# Patient Record
Sex: Female | Born: 1955 | Race: White | Hispanic: No | Marital: Single
Health system: Southern US, Academic
[De-identification: ages and names within clinical notes are randomized; demographics above are authoritative.]

## PROBLEM LIST (undated history)

## (undated) ENCOUNTER — Encounter

## (undated) ENCOUNTER — Ambulatory Visit

## (undated) DIAGNOSIS — I2510 Atherosclerotic heart disease of native coronary artery without angina pectoris: Secondary | ICD-10-CM

## (undated) DIAGNOSIS — E785 Hyperlipidemia, unspecified: Secondary | ICD-10-CM

## (undated) DIAGNOSIS — E119 Type 2 diabetes mellitus without complications: Secondary | ICD-10-CM

## (undated) DIAGNOSIS — N200 Calculus of kidney: Secondary | ICD-10-CM

## (undated) DIAGNOSIS — I10 Essential (primary) hypertension: Secondary | ICD-10-CM

## (undated) DIAGNOSIS — M545 Low back pain: Secondary | ICD-10-CM

## (undated) DIAGNOSIS — Z9889 Other specified postprocedural states: Secondary | ICD-10-CM

## (undated) DIAGNOSIS — E669 Obesity, unspecified: Secondary | ICD-10-CM

## (undated) DIAGNOSIS — R87629 Unspecified abnormal cytological findings in specimens from vagina: Secondary | ICD-10-CM

## (undated) HISTORY — DX: Low back pain: M54.5

## (undated) HISTORY — DX: Essential (primary) hypertension: I10

## (undated) HISTORY — DX: Type 2 diabetes mellitus without complications: E11.9

## (undated) HISTORY — DX: Calculus of kidney: N20.0

## (undated) HISTORY — DX: Hyperlipidemia, unspecified: E78.5

## (undated) HISTORY — DX: Atherosclerotic heart disease of native coronary artery without angina pectoris: I25.10

## (undated) HISTORY — DX: Obesity, unspecified: E66.9

## (undated) HISTORY — PX: CATARACT EXTRACTION: SUR2

## (undated) HISTORY — DX: Unspecified abnormal cytological findings in specimens from vagina: R87.629

## (undated) HISTORY — PX: GALLBLADDER SURGERY: SHX652

## (undated) HISTORY — PX: BREAST SURGERY: SHX581

---

## 2001-04-08 ENCOUNTER — Other Ambulatory Visit: Admission: RE | Admit: 2001-04-08 | Discharge: 2001-04-08 | Payer: Self-pay | Admitting: Obstetrics and Gynecology

## 2005-10-19 ENCOUNTER — Ambulatory Visit (HOSPITAL_BASED_OUTPATIENT_CLINIC_OR_DEPARTMENT_OTHER): Admission: RE | Admit: 2005-10-19 | Discharge: 2005-10-19 | Payer: Self-pay | Admitting: General Surgery

## 2005-10-19 ENCOUNTER — Encounter (INDEPENDENT_AMBULATORY_CARE_PROVIDER_SITE_OTHER): Payer: Self-pay | Admitting: Specialist

## 2005-10-19 ENCOUNTER — Encounter: Admission: RE | Admit: 2005-10-19 | Discharge: 2005-10-19 | Payer: Self-pay | Admitting: General Surgery

## 2008-06-11 ENCOUNTER — Other Ambulatory Visit: Admission: RE | Admit: 2008-06-11 | Discharge: 2008-06-11 | Payer: Self-pay | Admitting: Obstetrics and Gynecology

## 2009-06-14 ENCOUNTER — Other Ambulatory Visit: Admission: RE | Admit: 2009-06-14 | Discharge: 2009-06-14 | Payer: Self-pay | Admitting: Obstetrics and Gynecology

## 2010-07-15 ENCOUNTER — Other Ambulatory Visit: Admission: RE | Admit: 2010-07-15 | Discharge: 2010-07-15 | Payer: Self-pay | Admitting: Obstetrics and Gynecology

## 2010-10-02 ENCOUNTER — Encounter: Payer: Self-pay | Admitting: General Surgery

## 2011-01-27 NOTE — Op Note (Signed)
Leslie Cobb, Leslie Cobb                 ACCOUNT NO.:  [id number]   MEDICAL RECORD NO.:  [id number]          PATIENT TYPE:  AMB   LOCATION:  DSC                          FACILITY:  [facility]   PHYSICIAN:  Rose Phi. Young, M.D.   DATE OF BIRTH:  Jan 02, 1956   DATE OF PROCEDURE:  10/19/2005  DATE OF DISCHARGE:                                 OPERATIVE REPORT   PREOPERATIVE DIAGNOSIS:  Abnormal right breast mammogram.   POSTOPERATIVE DIAGNOSIS:  Abnormal right breast mammogram.   OPERATION:  Right breast biopsy with needle localization and specimen  mammogram.   SURGEON:  Rose Phi. Maple Hudson, M.D.   ANESTHESIA:  MAC.   OPERATIVE PROCEDURE:  Prior to coming into the operating room, a localizing  wire had been placed through the lesion at about the 4:30 position along the  areolar margin.   The patient was placed on the operating table with the arms extended on the  armboard. The right breast was prepped and draped in the usual fashion. A  curved incision using the previously placed wire and the marking for the  site of the lesion was then outlined with a marking pencil and the area  thoroughly infiltrated with the local anesthetic.   The incision was made and the wire delivered into the incision and then the  wire and surrounding tissue were excised. Specimen mammogram looked okay but  they also did an ultrasound which confirmed that the abnormality was  removed.   Hemostasis was obtained with the cautery and subcuticular closure of #4-0  Monocryl and Steri-Strips carried out. Dressing applied. The patient then  transferred to the recovery room in satisfactory condition having tolerated  the procedure well.      Rose Phi. Maple Hudson, M.D.  Electronically Signed     PRY/MEDQ  D:  10/19/2005  T:  10/19/2005  Job:  [id number]

## 2011-07-18 ENCOUNTER — Other Ambulatory Visit: Payer: Self-pay | Admitting: Adult Health

## 2011-07-18 ENCOUNTER — Other Ambulatory Visit (HOSPITAL_COMMUNITY)
Admission: RE | Admit: 2011-07-18 | Discharge: 2011-07-18 | Disposition: A | Discharge status: Home / Self Care | Payer: BC Managed Care – PPO | Source: Ambulatory Visit | Attending: Obstetrics and Gynecology | Admitting: Obstetrics and Gynecology

## 2011-07-18 DIAGNOSIS — Z01419 Encounter for gynecological examination (general) (routine) without abnormal findings: Secondary | ICD-10-CM | POA: Insufficient documentation

## 2012-07-31 ENCOUNTER — Other Ambulatory Visit: Payer: Self-pay | Admitting: Adult Health

## 2012-07-31 ENCOUNTER — Other Ambulatory Visit (HOSPITAL_COMMUNITY)
Admission: RE | Admit: 2012-07-31 | Discharge: 2012-07-31 | Disposition: A | Discharge status: Home / Self Care | Payer: BC Managed Care – PPO | Source: Ambulatory Visit | Attending: Obstetrics and Gynecology | Admitting: Obstetrics and Gynecology

## 2012-07-31 DIAGNOSIS — Z1151 Encounter for screening for human papillomavirus (HPV): Secondary | ICD-10-CM | POA: Insufficient documentation

## 2012-07-31 DIAGNOSIS — Z01419 Encounter for gynecological examination (general) (routine) without abnormal findings: Secondary | ICD-10-CM | POA: Insufficient documentation

## 2013-07-12 DIAGNOSIS — M545 Low back pain, unspecified: Secondary | ICD-10-CM

## 2013-07-12 HISTORY — DX: Low back pain, unspecified: M54.50

## 2013-08-06 ENCOUNTER — Ambulatory Visit (INDEPENDENT_AMBULATORY_CARE_PROVIDER_SITE_OTHER): Payer: BC Managed Care – PPO | Admitting: Adult Health

## 2013-08-06 ENCOUNTER — Encounter (INDEPENDENT_AMBULATORY_CARE_PROVIDER_SITE_OTHER): Payer: Self-pay

## 2013-08-06 ENCOUNTER — Encounter: Payer: Self-pay | Admitting: Adult Health

## 2013-08-06 VITALS — BP 128/80 | HR 78 | Ht 64.5 in | Wt 220.0 lb

## 2013-08-06 DIAGNOSIS — Z1212 Encounter for screening for malignant neoplasm of rectum: Secondary | ICD-10-CM

## 2013-08-06 DIAGNOSIS — E119 Type 2 diabetes mellitus without complications: Secondary | ICD-10-CM

## 2013-08-06 DIAGNOSIS — M545 Low back pain, unspecified: Secondary | ICD-10-CM | POA: Insufficient documentation

## 2013-08-06 DIAGNOSIS — I10 Essential (primary) hypertension: Secondary | ICD-10-CM

## 2013-08-06 DIAGNOSIS — E785 Hyperlipidemia, unspecified: Secondary | ICD-10-CM

## 2013-08-06 DIAGNOSIS — Z01419 Encounter for gynecological examination (general) (routine) without abnormal findings: Secondary | ICD-10-CM

## 2013-08-06 HISTORY — DX: Low back pain, unspecified: M54.50

## 2013-08-06 LAB — HEMOCCULT GUIAC POC 1CARD (OFFICE)

## 2013-08-06 MED ORDER — CYCLOBENZAPRINE HCL 5 MG PO TABS: 5.0000 mg | ORAL_TABLET | Freq: Three times a day (TID) | ORAL | Status: DC | PRN

## 2013-08-06 NOTE — Patient Instructions (Signed)
Back Pain, Adult Low back pain is very common. About 1 in 5 people have back pain.The cause of low back pain is rarely dangerous. The pain often gets better over time.About half of people with a sudden onset of back pain feel better in just 2 weeks. About 8 in 10 people feel better by 6 weeks.  CAUSES Some common causes of back pain include:  Strain of the muscles or ligaments supporting the spine.  Wear and tear (degeneration) of the spinal discs.  Arthritis.  Direct injury to the back. DIAGNOSIS Most of the time, the direct cause of low back pain is not known.However, back pain can be treated effectively even when the exact cause of the pain is unknown.Answering your caregiver's questions about your overall health and symptoms is one of the most accurate ways to make sure the cause of your pain is not dangerous. If your caregiver needs more information, he or she may order lab work or imaging tests (X-rays or MRIs).However, even if imaging tests show changes in your back, this usually does not require surgery. HOME CARE INSTRUCTIONS For many people, back pain returns.Since low back pain is rarely dangerous, it is often a condition that people can learn to manageon their own.   Remain active. It is stressful on the back to sit or stand in one place. Do not sit, drive, or stand in one place for more than 30 minutes at a time. Take short walks on level surfaces as soon as pain allows.Try to increase the length of time you walk each day.  Do not stay in bed.Resting more than 1 or 2 days can delay your recovery.  Do not avoid exercise or work.Your body is made to move.It is not dangerous to be active, even though your back may hurt.Your back will likely heal faster if you return to being active before your pain is gone.  Pay attention to your body when you bend and lift. Many people have less discomfortwhen lifting if they bend their knees, keep the load close to their bodies,and  avoid twisting. Often, the most comfortable positions are those that put less stress on your recovering back.  Find a comfortable position to sleep. Use a firm mattress and lie on your side with your knees slightly bent. If you lie on your back, put a pillow under your knees.  Only take over-the-counter or prescription medicines as directed by your caregiver. Over-the-counter medicines to reduce pain and inflammation are often the most helpful.Your caregiver may prescribe muscle relaxant drugs.These medicines help dull your pain so you can more quickly return to your normal activities and healthy exercise.  Put ice on the injured area.  Put ice in a plastic bag.  Place a towel between your skin and the bag.  Leave the ice on for 15-20 minutes, 03-04 times a day for the first 2 to 3 days. After that, ice and heat may be alternated to reduce pain and spasms.  Ask your caregiver about trying back exercises and gentle massage. This may be of some benefit.  Avoid feeling anxious or stressed.Stress increases muscle tension and can worsen back pain.It is important to recognize when you are anxious or stressed and learn ways to manage it.Exercise is a great option. SEEK MEDICAL CARE IF:  You have pain that is not relieved with rest or medicine.  You have pain that does not improve in 1 week.  You have new symptoms.  You are generally not feeling well. SEEK   IMMEDIATE MEDICAL CARE IF:   You have pain that radiates from your back into your legs.  You develop new bowel or bladder control problems.  You have unusual weakness or numbness in your arms or legs.  You develop nausea or vomiting.  You develop abdominal pain.  You feel faint. Document Released: 08/28/2005 Document Revised: 02/27/2012 Document Reviewed: 01/16/2011 [facility] Patient Information 2014 [place], [place]. Get mammogram Physical in 1 year Labs with PCP Get colonoscopy advised Try ice 10 minutes on then off 20-30  minutes  Take flexeril, if not better get xray

## 2013-08-06 NOTE — Progress Notes (Signed)
Patient ID: Leslie Cobb, female   DOB: 03-Jan-1956, 57 y.o.   MRN: [id number] History of Present Illness: Leslie Cobb is a 57 year old white female in for a physical.She had a normal pap with HPV 07/31/12.   Current Medications, Allergies, Past Medical History, Past Surgical History, Family History and Social History were reviewed in [employer] record.   Past Medical History  Diagnosis Date  . Hyperlipidemia   . Hypertension   . Diabetes mellitus without complication   . Obesity   . Low back pain 08/06/2013   Past Surgical History  Procedure Laterality Date  . Breast surgery Left   . Gallbladder surgery    Current outpatient prescriptions:aspirin (ST JOSEPH ASPIRIN) 81 MG EC tablet, Take 81 mg by mouth daily. Swallow whole., Disp: , Rfl: ;  atenolol (TENORMIN) 50 MG tablet, Take 50 mg by mouth daily. Takes one and a half daily, Disp: , Rfl: ;  Cyanocobalamin (VITAMIN B 12 PO), Take by mouth. Twice weekly, Disp: , Rfl: ;  Fluticasone Propionate (FLONASE NA), Place into the nose as needed., Disp: , Rfl:  glipiZIDE (GLUCOTROL) 10 MG tablet, Take 10 mg by mouth daily., Disp: , Rfl: ;  hydrochlorothiazide (HYDRODIURIL) 25 MG tablet, Take 25 mg by mouth daily., Disp: , Rfl: ;  losartan (COZAAR) 25 MG tablet, Take 25 mg by mouth daily., Disp: , Rfl: ;  lovastatin (MEVACOR) 20 MG tablet, Take 20 mg by mouth daily., Disp: , Rfl: ;  Multiple Vitamin (MULTIVITAMIN) tablet, Take 1 tablet by mouth daily., Disp: , Rfl:  potassium chloride (K-DUR) 10 MEQ tablet, Take 10 mEq by mouth daily., Disp: , Rfl: ;  cyclobenzaprine (FLEXERIL) 5 MG tablet, Take 1 tablet (5 mg total) by mouth 3 (three) times daily as needed for muscle spasms., Disp: 30 tablet, Rfl: 1  Review of Systems: Patient denies any headaches, blurred vision, shortness of breath, chest pain, abdominal pain, problems with bowel movements, urination, or intercourse. Not having sex.No joint pain but has low back pain that  sometimes radiates down left leg, no mood swings.   Physical Exam:BP 128/80  Pulse 78  Ht 5' 4.5" (1.638 m)  Wt 220 lb (99.791 kg)  BMI 37.19 kg/m2Urine negative General:  Well developed, well nourished, no acute distress Skin:  Warm and dry Neck:  Midline trachea, normal thyroid Lungs; Clear to auscultation bilaterally Breast:  No dominant palpable mass, retraction, or nipple discharge Cardiovascular: Regular rate and rhythm Abdomen:  Soft, non tender, no hepatosplenomegaly, No CVAT,some tenderness low back Pelvic:  External genitalia is normal in appearance.  The vagina is normal in appearance. The cervix is bulbous.  Uterus is felt to be normal size, shape, and contour.  No adnexal masses or tenderness noted. Rectal: Good sphincter tone, no polyps, or hemorrhoids felt.  Hemoccult negative. Extremities:  No swelling or varicosities noted Psych:  No mood changes, alert and cooperative  Impression: Yearly gyn exam no pap Low back pain Diabetes Hypertension Elevated cholesterol   Plan: Physical in 1 year Mammogram now and yearly Colonoscopy advised Labs with PCP Rx flexeril 5 mg #30 1 every 8 hours with 1 refill Use ice 10 minutes on then off 20 minutes Review handout on back pain if not better call and will get xray

## 2014-07-13 ENCOUNTER — Encounter: Payer: Self-pay | Admitting: Adult Health

## 2014-08-10 ENCOUNTER — Encounter: Payer: Self-pay | Admitting: Adult Health

## 2014-08-10 ENCOUNTER — Ambulatory Visit (INDEPENDENT_AMBULATORY_CARE_PROVIDER_SITE_OTHER): Payer: BC Managed Care – PPO | Admitting: Adult Health

## 2014-08-10 VITALS — BP 150/78 | HR 78 | Ht 64.0 in | Wt 219.0 lb

## 2014-08-10 DIAGNOSIS — Z1212 Encounter for screening for malignant neoplasm of rectum: Secondary | ICD-10-CM

## 2014-08-10 DIAGNOSIS — Z01419 Encounter for gynecological examination (general) (routine) without abnormal findings: Secondary | ICD-10-CM

## 2014-08-10 LAB — HEMOCCULT GUIAC POC 1CARD (OFFICE): Fecal Occult Blood, POC: NEGATIVE

## 2014-08-10 NOTE — Progress Notes (Signed)
Patient ID: Leslie Cobb, female   DOB: 10-27-1955, 58 y.o.   MRN: [id number] History of Present Illness:  Leslie Cobb is a 58 year old white female,divorced in for gyn exam.She had a normal pap with negative HPV 07/31/12.Got flu shot this year.  Current Medications, Allergies, Past Medical History, Past Surgical History, Family History and Social History were reviewed in [facility] electronic medical record.     Review of Systems: Patient denies any headaches, blurred vision, shortness of breath, chest pain, abdominal pain, problems with bowel movements, urination, or intercourse. Not having sex,no mood swings, has bursitis in left hip.    Physical Exam:BP 150/78 mmHg  Pulse 78  Ht 5\' 4"  (1.626 m)  Wt 219 lb (99.338 kg)  BMI 37.57 kg/m2 General:  Well developed, well nourished, no acute distress Skin:  Warm and dry Neck:  Midline trachea, normal thyroid Lungs; Clear to auscultation bilaterally Breast:  No dominant palpable mass, retraction, or nipple discharge Cardiovascular: Regular rate and rhythm Abdomen:  Soft, non tender, no hepatosplenomegaly Pelvic:  External genitalia is normal in appearance.  The vagina is normal in appearance. The cervix is smooth.  Uterus is felt to be normal size, shape, and contour.  No adnexal masses or tenderness noted. Rectal: Good sphincter tone, no polyps, or hemorrhoids felt.  Hemoccult negative. Extremities:  No swelling or varicosities noted Psych:  No mood changes,alert and cooperative,seems happy   Impression: Well woman gyn exam no pap    Plan: Pap and physical in 1 year Mammogram yearly  Labs with PCP Colonoscopy 2025, had this year

## 2014-08-10 NOTE — Patient Instructions (Signed)
Pap and physical in 1 year Mammogram yearly Labs with PCP Colonoscopy per GI 2025

## 2015-08-16 ENCOUNTER — Encounter: Payer: Self-pay | Admitting: Adult Health

## 2015-08-16 ENCOUNTER — Ambulatory Visit (INDEPENDENT_AMBULATORY_CARE_PROVIDER_SITE_OTHER): Payer: BC Managed Care – PPO | Admitting: Adult Health

## 2015-08-16 ENCOUNTER — Other Ambulatory Visit (HOSPITAL_COMMUNITY)
Admission: RE | Admit: 2015-08-16 | Discharge: 2015-08-16 | Disposition: A | Discharge status: Home / Self Care | Payer: BC Managed Care – PPO | Source: Ambulatory Visit | Attending: Adult Health | Admitting: Adult Health

## 2015-08-16 VITALS — BP 132/68 | HR 64 | Ht 63.5 in | Wt 208.0 lb

## 2015-08-16 DIAGNOSIS — Z1151 Encounter for screening for human papillomavirus (HPV): Secondary | ICD-10-CM | POA: Diagnosis not present

## 2015-08-16 DIAGNOSIS — Z01411 Encounter for gynecological examination (general) (routine) with abnormal findings: Secondary | ICD-10-CM | POA: Insufficient documentation

## 2015-08-16 DIAGNOSIS — Z1212 Encounter for screening for malignant neoplasm of rectum: Secondary | ICD-10-CM | POA: Diagnosis not present

## 2015-08-16 DIAGNOSIS — Z01419 Encounter for gynecological examination (general) (routine) without abnormal findings: Secondary | ICD-10-CM

## 2015-08-16 LAB — HEMOCCULT GUIAC POC 1CARD (OFFICE): Fecal Occult Blood, POC: NEGATIVE

## 2015-08-16 NOTE — Patient Instructions (Signed)
Physical in  1 year,pap in 3 if normal Mammogram yearly Labs with PCP  Colonoscopy per GI 

## 2015-08-16 NOTE — Progress Notes (Signed)
Patient ID: Leslie Cobb, female   DOB: 02-Sep-1956, 59 y.o.   MRN: [id number] History of Present Illness: [name] is a 59 year old white female,G1P1 in for a well woman gyn exam and pap.She was seen by PCP last week for GI bug,but is better,she has had labs with PCP and had mamm[place] in [place], at [facility], and had colonoscopy 2015 in [place] and got flu shot at work, she works for the [employer]. PCP is [facility] in [place].  Current Medications, Allergies, Past Medical History, Past Surgical History, Family History and Social History were reviewed in [facility] electronic medical record.     Review of Systems: Patient denies any headaches, hearing loss, fatigue, blurred vision, shortness of breath, chest pain, abdominal pain, problems with bowel movements, urination, or intercourse(not having sex). No joint pain or mood swings.    Physical Exam:BP 132/68 mmHg  Pulse 64  Ht 5' 3.5" (1.613 m)  Wt 208 lb (94.348 kg)  BMI 36.26 kg/m2 General:  Well developed, well nourished, no acute distress Skin:  Warm and dry Neck:  Midline trachea, normal thyroid, good ROM, no lymphadenopathy Lungs; Clear to auscultation bilaterally Breast:  No dominant palpable mass, retraction, or nipple discharge Cardiovascular: Regular rate and rhythm Abdomen:  Soft, non tender, no hepatosplenomegaly Pelvic:  External genitalia is normal in appearance, no lesions.  The vagina is normal in appearance. Urethra has no lesions or masses. The cervix is smooth,pap with HPV performed.  Uterus is felt to be normal size, shape, and contour.  No adnexal masses or tenderness noted.Bladder is non tender, no masses felt. Rectal: Good sphincter tone, no polyps, or hemorrhoids felt.  Hemoccult negative. Extremities/musculoskeletal:  No swelling or varicosities noted, no clubbing or cyanosis Psych:  No mood changes, alert and cooperative,seems happy   Impression: Well woman gyn exam and  pap    Plan: Physical in 1 year,pap in 3 if normal with negative HPV Mammogram yearly Colonoscopy per GI Labs with PCP

## 2015-08-17 LAB — CYTOLOGY - PAP

## 2015-08-18 ENCOUNTER — Telehealth: Payer: Self-pay | Admitting: Adult Health

## 2015-08-18 NOTE — Telephone Encounter (Signed)
Pt aware PAP ASCUS +HPV and needs colpo. To make appt

## 2015-08-18 NOTE — Telephone Encounter (Signed)
Left message to call about pap 

## 2015-08-19 ENCOUNTER — Telehealth: Payer: Self-pay | Admitting: Adult Health

## 2015-08-19 NOTE — Telephone Encounter (Signed)
Has colpo 12/19 just had question about pap, tried to answer

## 2015-08-19 NOTE — Telephone Encounter (Signed)
Left message I called 

## 2015-08-19 NOTE — Telephone Encounter (Signed)
Pt called stating that she is returning Jennifer's phone call. Pt states that she can't pick up her phone until 4:30 today. If she could geta phone call then or she'll call back @ this time.

## 2015-08-30 ENCOUNTER — Encounter: Payer: Self-pay | Admitting: Obstetrics and Gynecology

## 2015-08-30 ENCOUNTER — Ambulatory Visit (INDEPENDENT_AMBULATORY_CARE_PROVIDER_SITE_OTHER): Payer: BC Managed Care – PPO | Admitting: Obstetrics and Gynecology

## 2015-08-30 ENCOUNTER — Other Ambulatory Visit: Payer: Self-pay | Admitting: Obstetrics and Gynecology

## 2015-08-30 VITALS — BP 140/90 | Ht 64.0 in | Wt 212.0 lb

## 2015-08-30 DIAGNOSIS — R87610 Atypical squamous cells of undetermined significance on cytologic smear of cervix (ASC-US): Secondary | ICD-10-CM | POA: Diagnosis not present

## 2015-08-30 DIAGNOSIS — R87810 Cervical high risk human papillomavirus (HPV) DNA test positive: Secondary | ICD-10-CM | POA: Diagnosis not present

## 2015-08-30 MED ORDER — ESTROGENS, CONJUGATED 0.625 MG/GM VA CREA: 1.0000 g | TOPICAL_CREAM | VAGINAL | Status: DC

## 2015-08-30 NOTE — Progress Notes (Signed)
Patient ID: [name], female   DOB: Jun 13, 1956, 59 y.o.   MRN: [id number] Pt here today for colposcopy. Pt aware of procedure and questions answered.

## 2015-08-30 NOTE — Progress Notes (Signed)
Patient ID: Leslie Cobb, female   DOB: 11-28-1955, 59 y.o.   MRN: [id number]  Leslie Cobb 59 y.o. G1P1 here for colposcopy for ASCUS with POSITIVE high risk HPV pap smear on 08/16/15.  Discussed role for HPV in cervical dysplasia, need for surveillance. Pt has a h/o STD found on pap smear, which was her first abnormal pap. She reports no new partners. Pt states she no longer has periods.  Patient given informed consent, signed copy in the chart, time out was performed.  Placed in lithotomy position. Cervix viewed with speculum and colposcope after application of acetic acid.   Colposcopy adequate? Yes  no visible lesions; no biopsies.   ECC specimen obtained. All specimens were labelled and sent to pathology.   Colposcopy IMPRESSION: Vagina and cervix appear atrophic and post-menopausal. No visible dysplasia and ECC done.   Patient was given post procedure instructions. Will follow up pathology and manage accordingly.  Routine preventative health maintenance measures emphasized. Follow up for repeat pap smear in 3-6 months. Will treat with Premarin vc 3x/wk x one month prior to next pap.    By signing my name below, I, Doreatha MartinEva Mathews, attest that this documentation has been prepared under the direction and in the presence of Tilda BurrowJohn Iza Preston V, MD. Electronically Signed: Doreatha MartinEva Mathews, ED Scribe. 08/30/2015. 11:53 AM.  I personally performed the services described in this documentation, which was SCRIBED in my presence. The recorded information has been reviewed and considered accurate. It has been edited as necessary during review. Tilda BurrowFERGUSON,Khiara Shuping V, MD

## 2015-08-30 NOTE — Addendum Note (Signed)
Addended by: Richardson ChiquitoRAVIS, ASHLEY M on: 08/30/2015 12:24 PM   Modules accepted: Orders

## 2015-09-20 ENCOUNTER — Telehealth: Payer: Self-pay | Admitting: Obstetrics and Gynecology

## 2015-09-20 NOTE — Telephone Encounter (Signed)
Pt called stating that she would like a call back from the nurse regarding the results of her blood work. Please contact pt

## 2015-09-20 NOTE — Telephone Encounter (Signed)
Pt aware of results and aware to keep her appointment in March for her repeat pap.

## 2015-11-29 ENCOUNTER — Ambulatory Visit: Payer: BC Managed Care – PPO | Admitting: Obstetrics and Gynecology

## 2015-12-03 ENCOUNTER — Encounter: Payer: Self-pay | Admitting: Obstetrics and Gynecology

## 2015-12-03 ENCOUNTER — Other Ambulatory Visit (HOSPITAL_COMMUNITY)
Admission: RE | Admit: 2015-12-03 | Discharge: 2015-12-03 | Disposition: A | Discharge status: Home / Self Care | Payer: BC Managed Care – PPO | Source: Ambulatory Visit | Attending: Obstetrics and Gynecology | Admitting: Obstetrics and Gynecology

## 2015-12-03 ENCOUNTER — Ambulatory Visit (INDEPENDENT_AMBULATORY_CARE_PROVIDER_SITE_OTHER): Payer: BC Managed Care – PPO | Admitting: Obstetrics and Gynecology

## 2015-12-03 VITALS — BP 154/88 | HR 61 | Ht 64.0 in | Wt 218.0 lb

## 2015-12-03 DIAGNOSIS — Z124 Encounter for screening for malignant neoplasm of cervix: Secondary | ICD-10-CM

## 2015-12-03 DIAGNOSIS — Z1151 Encounter for screening for human papillomavirus (HPV): Secondary | ICD-10-CM | POA: Insufficient documentation

## 2015-12-03 DIAGNOSIS — R87610 Atypical squamous cells of undetermined significance on cytologic smear of cervix (ASC-US): Secondary | ICD-10-CM | POA: Insufficient documentation

## 2015-12-03 DIAGNOSIS — Z01411 Encounter for gynecological examination (general) (routine) with abnormal findings: Secondary | ICD-10-CM | POA: Diagnosis present

## 2015-12-03 DIAGNOSIS — R87810 Cervical high risk human papillomavirus (HPV) DNA test positive: Secondary | ICD-10-CM | POA: Diagnosis not present

## 2015-12-03 NOTE — Progress Notes (Signed)
Assessment:  Annual Gyn Exam   Plan:  1. pap smear done, next pap due 1 year 2. return annually or prn 3    Annual mammogram advised Subjective:  Leslie Cobb is a 60 y.o. female G1P1 who presents for annual exam. No LMP recorded. Patient is postmenopausal. The patient has no complaints today. Pt is here today for a repeat pap smear, due to having an abnormal pap smear in December 2016 indicative of ASCUS with Positive HPV. Pt had a colpo completed following the pap smear and was started on premarin and informed to follow up in 3-6 months for a pap smear.  Pt last dose of premarin was yesterday. Pt denies any other symptoms.   The following portions of the patient's history were reviewed and updated as appropriate: allergies, current medications, past family history, past medical history, past social history, past surgical history and problem list. Past Medical History  Diagnosis Date  . Hyperlipidemia   . Hypertension   . Diabetes mellitus without complication (HCC)   . Obesity   . Low back pain 08/06/2013  . Vaginal Pap smear, abnormal     Past Surgical History  Procedure Laterality Date  . Breast surgery Left   . Gallbladder surgery       Current outpatient prescriptions:  .  aspirin (ST JOSEPH ASPIRIN) 81 MG EC tablet, Take 81 mg by mouth daily. Swallow whole., Disp: , Rfl:  .  atenolol (TENORMIN) 50 MG tablet, Take 50 mg by mouth daily. Takes one and a half daily, Disp: , Rfl:  .  Cholecalciferol (VITAMIN D-3 PO), Take by mouth daily., Disp: , Rfl:  .  conjugated estrogens (PREMARIN) vaginal cream, Place 0.5 Applicatorfuls vaginally 3 (three) times a week. Use the month before your next pap., Disp: 42.5 g, Rfl: 1 .  Cyanocobalamin (VITAMIN B 12 PO), Take by mouth. Twice weekly, Disp: , Rfl:  .  glipiZIDE (GLUCOTROL XL) 10 MG 24 hr tablet, 10 mg daily. , Disp: , Rfl:  .  hydrochlorothiazide (HYDRODIURIL) 25 MG tablet, Take 25 mg by mouth daily., Disp: , Rfl:  .   losartan (COZAAR) 50 MG tablet, 50 mg daily. , Disp: , Rfl:  .  lovastatin (MEVACOR) 20 MG tablet, Take 20 mg by mouth daily., Disp: , Rfl:  .  Multiple Vitamin (MULTIVITAMIN) tablet, Take 1 tablet by mouth daily., Disp: , Rfl:  .  potassium chloride (K-DUR) 10 MEQ tablet, Take 10 mEq by mouth daily., Disp: , Rfl:  .  triamcinolone cream (KENALOG) 0.1 %, 1 application as needed. , Disp: , Rfl:   Review of Systems Constitutional: negative Gastrointestinal: negative Genitourinary: negative  Objective:  BP 154/88 mmHg  Pulse 61  Ht  (1.626 m)  Wt 218 lb (98.884 kg)  BMI 37.40 kg/m2   BMI: Body mass index is 37.4 kg/(m^2).  General Appearance: Alert, appropriate appearance for age. No acute distress HEENT: Grossly normal Neck / Thyroid:  Cardiovascular: RRR; normal S1, S2, no murmur Lungs: CTA bilaterally Back: No CVAT Breast Exam: Not examed Gastrointestinal: Soft, non-tender, no masses or organomegaly Pelvic Exam: Vulva and vagina appear normal. Bimanual exam reveals normal uterus and adnexa. External genitalia: normal general appearance Vaginal: normal mucosa without prolapse or lesions, normal without tenderness, induration or masses and normal rugae Cervix: normal appearance Adnexa: not completed Uterus: normal single, nontender and well supported Rectovaginal: not indicated Lymphatic Exam: Non-palpable nodes in neck, clavicular, axillary, or inguinal regions  Skin: no rash or abnormalities Neurologic:  Normal gait and speech, no tremor  Psychiatric: Alert and oriented, appropriate affect.  Urinalysis:Not done  Christin BachJohn Burton Gahan. MD Pgr [phone] 10:19 AM  A: Repeat pap smear due to ASCUS and +HPV  P: Follow up in 1 year   By signing my name below, I, Soijett Blue, attest that this documentation has been prepared under the direction and in the presence of Tilda BurrowJohn Tarique Loveall V, MD. Electronically Signed: Soijett Blue, ED Scribe. 12/03/2015. 10:28 AM.  I personally  performed the services described in this documentation, which was SCRIBED in my presence. The recorded information has been reviewed and considered accurate. It has been edited as necessary during review. Tilda BurrowFERGUSON,Margarete Horace V, MD

## 2015-12-08 LAB — CYTOLOGY - PAP

## 2015-12-15 ENCOUNTER — Telehealth: Payer: Self-pay | Admitting: Obstetrics and Gynecology

## 2015-12-15 NOTE — Telephone Encounter (Signed)
Pt aware of pap results and aware that she will need another colposcopy. Pt verbalized understanding and the phone call was sent up front for an appointment.

## 2015-12-20 ENCOUNTER — Ambulatory Visit (INDEPENDENT_AMBULATORY_CARE_PROVIDER_SITE_OTHER): Payer: BC Managed Care – PPO | Admitting: Obstetrics and Gynecology

## 2015-12-20 ENCOUNTER — Encounter: Payer: Self-pay | Admitting: Obstetrics and Gynecology

## 2015-12-20 ENCOUNTER — Other Ambulatory Visit: Payer: Self-pay | Admitting: Obstetrics and Gynecology

## 2015-12-20 VITALS — BP 170/100 | Ht 64.0 in

## 2015-12-20 DIAGNOSIS — IMO0002 Reserved for concepts with insufficient information to code with codable children: Secondary | ICD-10-CM

## 2015-12-20 DIAGNOSIS — R896 Abnormal cytological findings in specimens from other organs, systems and tissues: Secondary | ICD-10-CM

## 2015-12-20 NOTE — Progress Notes (Signed)
Patient ID: Leslie Cobb, female   DOB: 23-Feb-1956, 60 y.o.   MRN: [id number]  Leslie Cobb 60 y.o. G1P1 here for colposcopy for ASCUS with positive HPV pap smear on 12/03/15. Discussed role for HPV in cervical dysplasia, need for surveillance.  Patient given informed consent, signed copy in the chart, time out was performed.  Placed in lithotomy position. Cervix viewed with speculum and colposcope after application of acetic acid.   Colposcopy adequate? Yes cervix is atrophic postmenopausal with fibrosis; unable to visualize into cervical canal.  no visible lesions; biopsies obtained at 6, 2, 10 o'clock position.  Significant bleeding from 2 oclock bx site finally responded to Monsels. ECC specimen obtained. All specimens were labelled and sent to pathology.  Colposcopy IMPRESSION:  Patient was given post procedure instructions. Will follow up pathology and manage accordingly.  Routine preventative health maintenance measures emphasized.  Pt is advised to return one year for PAP for co-testing or as required per biopsy results.   By signing my name below, I, Marica Otterusrat Rahman, attest that this documentation has been prepared under the direction and in the presence of Christin BachJohn Emon Miggins, MD. Electronically Signed: Marica OtterNusrat Rahman, ED Scribe. 12/20/2015. 12:21 PM   I personally performed the services described in this documentation, which was SCRIBED in my presence. The recorded information has been reviewed and considered accurate. It has been edited as necessary during review. Tilda BurrowFERGUSON,Samreet [place] V, MD

## 2015-12-20 NOTE — Progress Notes (Signed)
Patient ID: Leslie Cobb, female   DOB: [birth year], 60 y.o.   MRN: [id number] Pt here today for colposcopy. Pt worried about

## 2016-12-04 ENCOUNTER — Other Ambulatory Visit: Payer: BC Managed Care – PPO | Admitting: Obstetrics and Gynecology

## 2016-12-07 ENCOUNTER — Encounter: Payer: Self-pay | Admitting: Obstetrics and Gynecology

## 2016-12-07 ENCOUNTER — Other Ambulatory Visit (HOSPITAL_COMMUNITY)
Admission: RE | Admit: 2016-12-07 | Discharge: 2016-12-07 | Disposition: A | Discharge status: Home / Self Care | Payer: BC Managed Care – PPO | Source: Ambulatory Visit | Attending: Obstetrics and Gynecology | Admitting: Obstetrics and Gynecology

## 2016-12-07 ENCOUNTER — Telehealth: Payer: Self-pay | Admitting: Obstetrics and Gynecology

## 2016-12-07 ENCOUNTER — Ambulatory Visit (INDEPENDENT_AMBULATORY_CARE_PROVIDER_SITE_OTHER): Payer: BC Managed Care – PPO | Admitting: Obstetrics and Gynecology

## 2016-12-07 VITALS — BP 120/60 | HR 68 | Ht 63.75 in | Wt 211.5 lb

## 2016-12-07 DIAGNOSIS — Z01411 Encounter for gynecological examination (general) (routine) with abnormal findings: Secondary | ICD-10-CM | POA: Diagnosis present

## 2016-12-07 DIAGNOSIS — B373 Candidiasis of vulva and vagina: Secondary | ICD-10-CM

## 2016-12-07 DIAGNOSIS — Z1151 Encounter for screening for human papillomavirus (HPV): Secondary | ICD-10-CM | POA: Insufficient documentation

## 2016-12-07 DIAGNOSIS — Z01419 Encounter for gynecological examination (general) (routine) without abnormal findings: Secondary | ICD-10-CM

## 2016-12-07 DIAGNOSIS — R350 Frequency of micturition: Secondary | ICD-10-CM | POA: Diagnosis not present

## 2016-12-07 DIAGNOSIS — Z1212 Encounter for screening for malignant neoplasm of rectum: Secondary | ICD-10-CM | POA: Diagnosis not present

## 2016-12-07 DIAGNOSIS — R87610 Atypical squamous cells of undetermined significance on cytologic smear of cervix (ASC-US): Secondary | ICD-10-CM

## 2016-12-07 DIAGNOSIS — B3731 Acute candidiasis of vulva and vagina: Secondary | ICD-10-CM

## 2016-12-07 LAB — HEMOCCULT GUIAC POC 1CARD (OFFICE): FECAL OCCULT BLD: NEGATIVE

## 2016-12-07 LAB — POCT URINALYSIS DIPSTICK
KETONES UA: NEGATIVE
Leukocytes, UA: NEGATIVE
Nitrite, UA: NEGATIVE
PROTEIN UA: NEGATIVE

## 2016-12-07 MED ORDER — MICONAZOLE NITRATE 2 % VA CREA: 1.0000 | TOPICAL_CREAM | VAGINAL | 99 refills | Status: DC

## 2016-12-07 MED ORDER — FLUCONAZOLE 150 MG PO TABS: 150.0000 mg | ORAL_TABLET | ORAL | 3 refills | Status: DC

## 2016-12-07 NOTE — Telephone Encounter (Signed)
Left message letting pt know Monistat and Diflucan was [facility] [place]. JSY

## 2016-12-07 NOTE — Addendum Note (Signed)
Addended by: Tilda BurrowFERGUSON, Raine Blodgett V on: 12/07/2016 05:06 PM   Modules accepted: Orders

## 2016-12-07 NOTE — Progress Notes (Addendum)
Assessment:  1. Annual Gyn Exam 2. Post menopausal atrophic vaginitis  3. 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 History of ASCUS pap, repeated today Plan:  1. pap smear done, next pap due 1 year (due to ASCUS on 2017 pap smear) 2. return annually or prn 3    Annual mammogram advised 4.   Use Monistat twice weekly  5 Diflucan 150 mg q wk Subjective:  Leslie Cobb is a 61 y.o. female G1P1001 who presents for annual exam. No LMP recorded. Patient is postmenopausal. The patient has vulvar irritation. She notes having trouble controlling her diabetes. Fasting morning CBG today was 230s. States she was recently changed to [place]. She is physically inactive.   The following portions of the patient's history were reviewed and updated as appropriate: allergies, current medications, past family history, past medical history, past social history, past surgical history and problem list. Past Medical History:  Diagnosis Date   Diabetes mellitus without complication (HCC)    Hyperlipidemia    Hypertension    Low back pain 08/06/2013   Obesity    Vaginal Pap smear, abnormal     Past Surgical History:  Procedure Laterality Date   BREAST SURGERY Left    GALLBLADDER SURGERY       Current Outpatient Prescriptions:    aspirin (ST JOSEPH ASPIRIN) 81 MG EC tablet, Take 81 mg by mouth daily. Swallow whole., Disp: , Rfl:    atenolol (TENORMIN) 50 MG tablet, Take 50 mg by mouth daily. Takes one and a half daily, Disp: , Rfl:    Cholecalciferol (VITAMIN D-3 PO), Take by mouth daily., Disp: , Rfl:    conjugated estrogens (PREMARIN) vaginal cream, Place 0.5 Applicatorfuls vaginally 3 (three) times a week. Use the month before your next pap. (Patient taking differently: Place 1 g vaginally as needed. Use the month before your next pap.), Disp: 42.5 g, Rfl: 1   Cyanocobalamin (VITAMIN B 12 PO), Take by mouth. Twice weekly, Disp: , Rfl:    hydrochlorothiazide (HYDRODIURIL)  25 MG tablet, Take 25 mg by mouth daily., Disp: , Rfl:    JARDIANCE 10 MG TABS tablet, 10 mg daily. , Disp: , Rfl:    losartan (COZAAR) 50 MG tablet, 50 mg daily. , Disp: , Rfl:    lovastatin (MEVACOR) 20 MG tablet, Take 20 mg by mouth daily., Disp: , Rfl:    Multiple Vitamin (MULTIVITAMIN) tablet, Take 1 tablet by mouth daily., Disp: , Rfl:    Omega-3 Fatty Acids (OMEGA 3 PO), Take by mouth daily., Disp: , Rfl:    potassium chloride (K-DUR) 10 MEQ tablet, Take 10 mEq by mouth daily., Disp: , Rfl:    triamcinolone cream (KENALOG) 0.1 %, 1 application as needed. , Disp: , Rfl:   Review of Systems Constitutional: negative Gastrointestinal: negative Genitourinary: negative   Objective:  BP 120/60 (BP Location: Left Arm, Patient Position: Sitting, Cuff Size: Large)    Pulse 68    Ht 5' 3.75" (1.619 m)    Wt 211 lb 8 oz (95.9 kg)    BMI 36.59 kg/m    BMI: Body mass index is 36.59 kg/m.  General Appearance: Alert, appropriate appearance for age. No acute distress HEENT: Grossly normal Neck / Thyroid:  Cardiovascular: RRR; normal S1, S2, no murmur Lungs: CTA bilaterally Back: No CVAT Breast Exam: No dimpling, nipple retraction or discharge. No masses or nodes., Normal to inspection, Normal breast tissue bilaterally and No masses or nodes.No dimpling, nipple retraction or discharge. Gastrointestinal:  Soft, non-tender, no masses or organomegaly Pelvic Exam: External genitalia: slight erythema  Vaginal: atrophic vaginal tissues, good support, slight erythema.  Cervix: normal appearance Adnexa: normal bimanual exam Uterus: normal single, nontender Rectovaginal: normal rectal, no masses and guaiac negative stool obtained Lymphatic Exam: Non-palpable nodes in neck, clavicular, axillary, or inguinal regions  Skin: no rash or abnormalities Neurologic: Normal gait and speech, no tremor  Psychiatric: Alert and oriented, appropriate affect.  Urinalysis: 4+ glucose and trace blood  Leslie Cobb. MD Pgr [phone] 10:44 AM  By signing my name below, I, Leslie Cobb, attest that this documentation has been prepared under the direction and in the presence of Tilda Burrow, MD. Electronically Signed: Sonum Cobb, Scribe. 12/07/16. 10:44 AM.  I personally performed the services described in this documentation, which was SCRIBED in my presence. The recorded information has been reviewed and considered accurate. It has been edited as necessary during review. Tilda Burrow, MD

## 2016-12-07 NOTE — Progress Notes (Signed)
°Assessment:  °1. Annual Gyn Exam °2. Post menopausal atrophic vaginitis  °3. 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 History of ASCUS pap, repeated today °Plan:  °1. pap smear done, next pap due 1 year (due to ASCUS on 2017 pap smear) °2. return annually or prn °3    Annual mammogram advised °4.   Use Monistat twice weekly  °5 Diflucan 150 mg q wk °Subjective:  °Leslie Cobb is a 61 y.o. female G1P1001 who presents for annual exam. No LMP recorded. Patient is postmenopausal. °The patient has vulvar irritation. She notes having trouble controlling her diabetes. Fasting morning CBG today was 230s. States she was recently changed to Jardiance. She is physically inactive.  ° °The following portions of the patient's history were reviewed and updated as appropriate: allergies, current medications, past family history, past medical history, past social history, past surgical history and problem list. °Past Medical History:  °Diagnosis Date  °• Diabetes mellitus without complication (HCC)   °• Hyperlipidemia   °• Hypertension   °• Low back pain 08/06/2013  °• Obesity   °• Vaginal Pap smear, abnormal   ° ° °Past Surgical History:  °Procedure Laterality Date  °• BREAST SURGERY Left   °• GALLBLADDER SURGERY    ° ° ° °Current Outpatient Prescriptions:  °•  aspirin (ST JOSEPH ASPIRIN) 81 MG EC tablet, Take 81 mg by mouth daily. Swallow whole., Disp: , Rfl:  °•  atenolol (TENORMIN) 50 MG tablet, Take 50 mg by mouth daily. Takes one and a half daily, Disp: , Rfl:  °•  Cholecalciferol (VITAMIN D-3 PO), Take by mouth daily., Disp: , Rfl:  °•  conjugated estrogens (PREMARIN) vaginal cream, Place 0.5 Applicatorfuls vaginally 3 (three) times a week. Use the month before your next pap. (Patient taking differently: Place 1 g vaginally as needed. Use the month before your next pap.), Disp: 42.5 g, Rfl: 1 °•  Cyanocobalamin (VITAMIN B 12 PO), Take by mouth. Twice weekly, Disp: , Rfl:  °•  hydrochlorothiazide (HYDRODIURIL)  25 MG tablet, Take 25 mg by mouth daily., Disp: , Rfl:  °•  JARDIANCE 10 MG TABS tablet, 10 mg daily. , Disp: , Rfl:  °•  losartan (COZAAR) 50 MG tablet, 50 mg daily. , Disp: , Rfl:  °•  lovastatin (MEVACOR) 20 MG tablet, Take 20 mg by mouth daily., Disp: , Rfl:  °•  Multiple Vitamin (MULTIVITAMIN) tablet, Take 1 tablet by mouth daily., Disp: , Rfl:  °•  Omega-3 Fatty Acids (OMEGA 3 PO), Take by mouth daily., Disp: , Rfl:  °•  potassium chloride (K-DUR) 10 MEQ tablet, Take 10 mEq by mouth daily., Disp: , Rfl:  °•  triamcinolone cream (KENALOG) 0.1 %, 1 application as needed. , Disp: , Rfl:  ° °Review of Systems °Constitutional: negative °Gastrointestinal: negative °Genitourinary: negative  ° °Objective:  °BP 120/60 (BP Location: Left Arm, Patient Position: Sitting, Cuff Size: Large)    Pulse 68    Ht 5' 3.75" (1.619 m)    Wt 211 lb 8 oz (95.9 kg)    BMI 36.59 kg/m²   ° BMI: Body mass index is 36.59 kg/m².  °General Appearance: Alert, appropriate appearance for age. No acute distress °HEENT: Grossly normal °Neck / Thyroid:  °Cardiovascular: RRR; normal S1, S2, no murmur °Lungs: CTA bilaterally °Back: No CVAT °Breast Exam: No dimpling, nipple retraction or discharge. No masses or nodes., Normal to inspection, Normal breast tissue bilaterally and No masses or nodes.No dimpling, nipple retraction or discharge. °Gastrointestinal:   Soft, non-tender, no masses or organomegaly °Pelvic Exam: External genitalia: slight erythema  °Vaginal: atrophic vaginal tissues, good support, slight erythema.  °Cervix: normal appearance °Adnexa: normal bimanual exam °Uterus: normal single, nontender °Rectovaginal: normal rectal, no masses and guaiac negative stool obtained °Lymphatic Exam: Non-palpable nodes in neck, clavicular, axillary, or inguinal regions  °Skin: no rash or abnormalities °Neurologic: Normal gait and speech, no tremor  °Psychiatric: Alert and oriented, appropriate affect. ° °Urinalysis: 4+ glucose and trace blood ° °Oluwatoyin Banales. MD °Pgr [phone] °10:44 AM ° °By signing my name below, I, Sonum Patel, attest that this documentation has been prepared under the direction and in the presence of Blanca Carreon V, MD. °Electronically Signed: Sonum Patel, Scribe. 12/07/16. 10:44 AM. ° °I personally performed the services described in this documentation, which was SCRIBED in my presence. The recorded information has been reviewed and considered accurate. It has been edited as necessary during review. °Laurier Jasperson V, MD ° °  °

## 2016-12-12 LAB — CYTOLOGY - PAP
DIAGNOSIS: NEGATIVE
HPV: DETECTED — AB

## 2016-12-13 LAB — URINE CULTURE

## 2016-12-15 ENCOUNTER — Other Ambulatory Visit: Payer: Self-pay | Admitting: Obstetrics and Gynecology

## 2016-12-15 ENCOUNTER — Telehealth: Payer: Self-pay | Admitting: *Deleted

## 2016-12-15 DIAGNOSIS — N390 Urinary tract infection, site not specified: Secondary | ICD-10-CM | POA: Insufficient documentation

## 2016-12-15 DIAGNOSIS — N3000 Acute cystitis without hematuria: Secondary | ICD-10-CM

## 2016-12-15 MED ORDER — NITROFURANTOIN MONOHYD MACRO 100 MG PO CAPS: 100.0000 mg | ORAL_CAPSULE | Freq: Two times a day (BID) | ORAL | 0 refills | Status: DC

## 2016-12-15 NOTE — Progress Notes (Signed)
rx macrobid  

## 2016-12-15 NOTE — Progress Notes (Signed)
Rx Macrobid for enterococcus growing on urine culture.

## 2016-12-15 NOTE — Telephone Encounter (Signed)
Called patient to inform her of positive urine culture per Dr Emelda Fear. Marcrobid was sent to pharmacy and advised to take all the medication. Patient verbalized understanding.

## 2016-12-23 MED ORDER — NITROFURANTOIN MONOHYD MACRO 100 MG PO CAPS: 100.0000 mg | ORAL_CAPSULE | Freq: Two times a day (BID) | ORAL | 0 refills | Status: AC

## 2017-12-12 ENCOUNTER — Ambulatory Visit (HOSPITAL_COMMUNITY)
Admission: RE | Admit: 2017-12-12 | Discharge: 2017-12-12 | Disposition: A | Discharge status: Home / Self Care | Payer: BC Managed Care – PPO | Source: Ambulatory Visit | Attending: Adult Health | Admitting: Adult Health

## 2017-12-12 ENCOUNTER — Ambulatory Visit (INDEPENDENT_AMBULATORY_CARE_PROVIDER_SITE_OTHER): Payer: BC Managed Care – PPO | Admitting: Adult Health

## 2017-12-12 ENCOUNTER — Encounter: Payer: Self-pay | Admitting: Adult Health

## 2017-12-12 ENCOUNTER — Other Ambulatory Visit (HOSPITAL_COMMUNITY)
Admission: RE | Admit: 2017-12-12 | Discharge: 2017-12-12 | Disposition: A | Discharge status: Home / Self Care | Payer: BC Managed Care – PPO | Source: Ambulatory Visit | Attending: Adult Health | Admitting: Adult Health

## 2017-12-12 ENCOUNTER — Other Ambulatory Visit: Payer: Self-pay

## 2017-12-12 VITALS — BP 138/86 | HR 71 | Resp 20 | Ht 64.0 in | Wt 210.0 lb

## 2017-12-12 DIAGNOSIS — Z01419 Encounter for gynecological examination (general) (routine) without abnormal findings: Secondary | ICD-10-CM | POA: Insufficient documentation

## 2017-12-12 DIAGNOSIS — R0602 Shortness of breath: Secondary | ICD-10-CM

## 2017-12-12 DIAGNOSIS — R87810 Cervical high risk human papillomavirus (HPV) DNA test positive: Secondary | ICD-10-CM | POA: Diagnosis not present

## 2017-12-12 DIAGNOSIS — Z1211 Encounter for screening for malignant neoplasm of colon: Secondary | ICD-10-CM | POA: Diagnosis not present

## 2017-12-12 DIAGNOSIS — I517 Cardiomegaly: Secondary | ICD-10-CM | POA: Insufficient documentation

## 2017-12-12 DIAGNOSIS — Z01411 Encounter for gynecological examination (general) (routine) with abnormal findings: Secondary | ICD-10-CM

## 2017-12-12 DIAGNOSIS — Z1212 Encounter for screening for malignant neoplasm of rectum: Secondary | ICD-10-CM

## 2017-12-12 DIAGNOSIS — J9811 Atelectasis: Secondary | ICD-10-CM | POA: Insufficient documentation

## 2017-12-12 LAB — HEMOCCULT GUIAC POC 1CARD (OFFICE): Fecal Occult Blood, POC: NEGATIVE

## 2017-12-12 NOTE — Progress Notes (Signed)
Patient ID: Leslie Cobb, female   DOB: April 10, 1956, 62 y.o.   MRN: [id number] History of Present Illness: Leslie Cobb is a 62 year old white female, divorced in for a well woman gyn exam and pap, her pap was +HPV 12/07/16.She has been treated recently for URI, and has shortness of breath with exertion. Daughter with pt today. PCP is Dr Leslie Cobb.   Current Medications, Allergies, Past Medical History, Past Surgical History, Family History and Social History were reviewed in [facility] electronic medical record.     Review of Systems:  Patient denies any headaches, hearing loss, fatigue, blurred vision, chest pain, abdominal pain, problems with bowel movements, urination, or intercourse(not having sex). No joint pain or mood swings.Had nausea and vomiting and diarrhea once when on antibiotics.  +shortness of breath on exertion   Physical Exam:BP 138/86 (BP Location: Right Arm, Patient Position: Sitting, Cuff Size: Large)   Pulse 71   Resp 20   Ht 5\' 4"  (1.626 m)   Wt 210 lb (95.3 kg)   BMI 36.05 kg/m  General:  Well developed, well nourished, no acute distress Skin:  Warm and dry Neck:  Midline trachea, normal thyroid, good ROM, no lymphadenopathy,no sinus tenderness  Lungs; Clear to auscultation bilaterally Breast:  No dominant palpable mass, retraction, or nipple discharge Cardiovascular: Regular rate and rhythm Abdomen:  Soft, non tender, no hepatosplenomegaly Pelvic:  External genitalia is normal in appearance, no lesions.  The vagina is pale with loss of moisture and rugae.  Urethra has no lesions or masses. The cervix is smooth, pap with HPV performed.  Uterus is felt to be normal size, shape, and contour.  No adnexal masses or tenderness noted.Bladder is non tender, no masses felt. Rectal: Good sphincter tone, no polyps, + hemorrhoids felt.  Hemoccult negative. Extremities/musculoskeletal:  No swelling or varicosities noted, no clubbing or cyanosis Psych:  No mood changes, alert  and cooperative,seems happy PHQ 2 score 0.  Will get chest xray today, may get cardiology consult, but she declines that for now.   Impression: 1. Encounter for gynecological examination with Papanicolaou smear of cervix   2. Screening for colorectal cancer   3. Cervical high risk human papillomavirus (HPV) DNA test positive   4. Short of breath on exertion       Plan:  [facility],will talk in am  Labs with PCP  M[facility]ogram yearly, [facility] 1 year, pap in 3 if normal

## 2017-12-13 ENCOUNTER — Encounter: Payer: Self-pay | Admitting: Adult Health

## 2017-12-13 ENCOUNTER — Telehealth: Payer: Self-pay | Admitting: Adult Health

## 2017-12-13 DIAGNOSIS — I517 Cardiomegaly: Secondary | ICD-10-CM | POA: Insufficient documentation

## 2017-12-13 DIAGNOSIS — R0602 Shortness of breath: Secondary | ICD-10-CM

## 2017-12-13 NOTE — Telephone Encounter (Signed)
Pt aware chest xray showed cardiomegaly, will refer to cardiologist. Her mom has hx CHF

## 2017-12-14 LAB — CYTOLOGY - PAP
Diagnosis: NEGATIVE
HPV: NOT DETECTED

## 2018-01-11 ENCOUNTER — Encounter: Payer: Self-pay | Admitting: Cardiology

## 2018-01-11 NOTE — Progress Notes (Signed)
Cardiology Office Note  Date: 01/14/2018   ID: Leslie Cobb, DOB 14-Apr-1956, MRN [id number]  PCP: Leslie Alcide, MD  Consulting Cardiologist: Leslie Dell, MD   Chief Complaint  Patient presents with  . Dyspnea on exertion    History of Present Illness: Leslie Cobb is a 62 y.o. female referred for cardiology consultation by Ms. Griffin NP for evaluation of cardiomegaly described by chest x-ray.  She presents describing a history of dyspnea on exertion, also an associated vague burning in her chest.  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  She states that with certain levels of activity, particularly when she pushes herself, she has NYHA class II-III dyspnea, does not necessarily have to stop, but notices that she has to catch her breath once the activity is completed.  Reports that her symptoms are worse around the time of an apparent upper respiratory tract infection about a month ago.  Does not have any personal history of cardiomyopathy or CAD.  Heart disease does run in her family, first-degree relatives.  Reviewed her medications which are outlined below.  She reports no recent changes and has tolerated her current antihypertensive regimen which includes ARB and beta-blocker.  I personally reviewed her ECG today which shows sinus bradycardia with poor R wave progression and nonspecific ST-T abnormalities.  She has not undergone any previous cardiac structural or ischemic testing.  Past Medical History:  Diagnosis Date  . Hyperlipidemia   . Hypertension   . Low back pain 08/06/2013  . Obesity   . Type 2 diabetes mellitus (HCC)   . Vaginal Pap smear, abnormal     Past Surgical History:  Procedure Laterality Date  . BREAST SURGERY Left   . GALLBLADDER SURGERY      Current Outpatient Medications  Medication Sig Dispense Refill  . aspirin (ST JOSEPH ASPIRIN) 81 MG EC tablet Take 81 mg by mouth daily. Swallow whole.    Marland Kitchen atenolol  (TENORMIN) 50 MG tablet Take 75 mg by mouth daily. Take 1 tablet in the morning and 1/2 tablet in the evening    . Cholecalciferol (VITAMIN D-3 PO) Take by mouth daily.    . Cyanocobalamin (VITAMIN B 12 PO) Take by mouth. Twice weekly    . glipiZIDE (GLUCOTROL) 10 MG tablet Take 10 mg by mouth daily before breakfast.    . hydrochlorothiazide (HYDRODIURIL) 25 MG tablet Take 25 mg by mouth daily.    Marland Kitchen losartan (COZAAR) 100 MG tablet Take 100 mg by mouth daily.    Marland Kitchen lovastatin (MEVACOR) 20 MG tablet Take 20 mg by mouth daily.    . metFORMIN (GLUCOPHAGE) 500 MG tablet Take 1,500 mg by mouth daily with breakfast.    . Multiple Vitamin (MULTIVITAMIN) tablet Take 1 tablet by mouth daily.    . Omega-3 Fatty Acids (OMEGA 3 PO) Take by mouth daily.    . potassium chloride (K-DUR) 10 MEQ tablet Take 10 mEq by mouth daily.    Marland Kitchen triamcinolone cream (KENALOG) 0.1 % 1 application as needed.      No current facility-administered medications for this visit.    Allergies:  Patient has no known allergies.   Social History: The patient  reports that she has never smoked. She has never used smokeless tobacco. She reports that she does not drink alcohol or use drugs.   Family History: [name] history includes Arthritis in her mother; Diabetes in her maternal aunt, maternal grandmother, and mother; Heart disease  in her brother, brother, brother, father, and mother; Hypertension in her brother and mother; Kidney disease in her mother; Lung cancer in her father.   ROS:  Please see the history of present illness. Otherwise, complete review of systems is positive for none.  All other systems are reviewed and negative.   Physical Exam: VS:  BP (!) 150/82   Pulse (!) 59   Ht  (1.626 m)   Wt 207 lb (93.9 kg)   SpO2 97%   BMI 35.53 kg/m , BMI Body mass index is 35.53 kg/m.  Wt Readings from Last 3 Encounters:  01/14/18 207 lb (93.9 kg)  12/12/17 210 lb (95.3 kg)  12/07/16 211 lb 8 oz (95.9 kg)     General: Patient appears comfortable at rest. HEENT: Conjunctiva and lids normal, oropharynx clear. Neck: Supple, no elevated JVP or carotid bruits, no thyromegaly. Lungs: Clear to auscultation, nonlabored breathing at rest. Cardiac: Regular rate and rhythm, no S3, 2/6 systolic murmur. Abdomen: Soft, nontender, bowel sounds present, no guarding or rebound. Extremities: No pitting edema, distal pulses 2+. Skin: Warm and dry. Musculoskeletal: No kyphosis. Neuropsychiatric: Alert and oriented x3, affect grossly appropriate.  ECG: There is no old tracing available for comparison today.  Other Studies Reviewed Today:  Chest x-ray 12/12/2017: FINDINGS: Cardiac contours are enlarged. Tortuosity of the thoracic aorta. No consolidative pulmonary opacities. No pleural effusion or pneumothorax. Thoracic spine degenerative changes. Cholecystectomy clips.  IMPRESSION: Cardiomegaly.  No acute cardiopulmonary process.  Basilar atelectasis.  Assessment and Plan:  1.  Dyspnea on exertion as well as intermittent exertional chest discomfort.  She has a hard time explaining how long the symptoms have been present, but it sounds like this has been going on for several months.  May have been worse during recent URI as well.  The recent chest x-ray did not show any acute infiltrates with description of cardiomegaly.  Voltage is normal by ECG with poor R wave progression.  Cardiac risk factors include family history of CAD, also diabetes mellitus, hypertension, and hyperlipidemia.  She also has a 2/6 systolic murmur in the aortic position.  Plan is to obtain an echocardiogram for cardiac structural assessment and also a Lexiscan Myoview for ischemic evaluation.  Further plans to follow.  2.  Essential hypertension.  On atenolol, HCTZ, and Cozaar.  Keep follow-up with Leslie Cobb.  3.  Hyperlipidemia, on Mevacor.  Followed by Leslie Cobb.  4.  Type 2 diabetes mellitus, on Glucotrol and Glucophage,  followed by Leslie Cobb.  Current medicines were reviewed with the patient today.   Orders Placed This Encounter  Procedures  . NM Myocar Multi W/Spect W/Wall Motion / EF  . EKG 12-Lead  . ECHOCARDIOGRAM COMPLETE    Disposition: Call with test results.  Signed, Jonelle Sidle, MD, [facility] 01/14/2018 9:17 AM    [facility] at [facility] [address] [place], [place], [place] [address] Phone: [phone]; Fax: [phone]

## 2018-01-14 ENCOUNTER — Ambulatory Visit: Payer: BC Managed Care – PPO | Admitting: Cardiology

## 2018-01-14 ENCOUNTER — Telehealth: Payer: Self-pay | Admitting: Cardiology

## 2018-01-14 ENCOUNTER — Encounter: Payer: Self-pay | Admitting: Cardiology

## 2018-01-14 VITALS — BP 150/82 | HR 59 | Ht 64.0 in | Wt 207.0 lb

## 2018-01-14 DIAGNOSIS — I10 Essential (primary) hypertension: Secondary | ICD-10-CM

## 2018-01-14 DIAGNOSIS — I517 Cardiomegaly: Secondary | ICD-10-CM | POA: Diagnosis not present

## 2018-01-14 DIAGNOSIS — R0609 Other forms of dyspnea: Secondary | ICD-10-CM | POA: Diagnosis not present

## 2018-01-14 DIAGNOSIS — E782 Mixed hyperlipidemia: Secondary | ICD-10-CM

## 2018-01-14 DIAGNOSIS — E119 Type 2 diabetes mellitus without complications: Secondary | ICD-10-CM

## 2018-01-14 DIAGNOSIS — R9431 Abnormal electrocardiogram [ECG] [EKG]: Secondary | ICD-10-CM

## 2018-01-14 NOTE — Patient Instructions (Signed)
Medication Instructions:  Your physician recommends that you continue on your current medications as directed. Please refer to the Current Medication list given to you today.  Labwork: NONE  Testing/Procedures: Your physician has requested that you have an echocardiogram. Echocardiography is a painless test that uses sound waves to create images of your heart. It provides your doctor with information about the size and shape of your heart and how well your heart's chambers and valves are working. This procedure takes approximately one hour. There are no restrictions for this procedure.  Your physician has requested that you have a lexiscan myoview. For further information please visit [email or web address]. Please follow instruction sheet, as given.  Follow-Up: Your physician recommends that you schedule a follow-up appointment PENDING TEST RESULTS  Any Other Special Instructions Will Be Listed Below (If Applicable).  If you need a refill on your cardiac medications before your next appointment, please call your pharmacy. 

## 2018-01-14 NOTE — Telephone Encounter (Signed)
Echo scheduled at [facility] Jan 23, 2018 arrive at 12:45  Lexiscan scheduled at [facility] May 29,2019 arrive at 9:15

## 2018-01-23 ENCOUNTER — Telehealth: Payer: Self-pay | Admitting: *Deleted

## 2018-01-23 ENCOUNTER — Ambulatory Visit (HOSPITAL_COMMUNITY)
Admission: RE | Admit: 2018-01-23 | Discharge: 2018-01-23 | Disposition: A | Discharge status: Home / Self Care | Payer: BC Managed Care – PPO | Source: Ambulatory Visit | Attending: Family Medicine | Admitting: Family Medicine

## 2018-01-23 DIAGNOSIS — E119 Type 2 diabetes mellitus without complications: Secondary | ICD-10-CM | POA: Insufficient documentation

## 2018-01-23 DIAGNOSIS — R9431 Abnormal electrocardiogram [ECG] [EKG]: Secondary | ICD-10-CM

## 2018-01-23 DIAGNOSIS — R0609 Other forms of dyspnea: Secondary | ICD-10-CM

## 2018-01-23 DIAGNOSIS — I10 Essential (primary) hypertension: Secondary | ICD-10-CM | POA: Diagnosis not present

## 2018-01-23 NOTE — Progress Notes (Signed)
*  PRELIMINARY RESULTS* Echocardiogram 2D Echocardiogram has been performed.  Stacey Drain 01/23/2018, 2:13 PM

## 2018-01-23 NOTE — Telephone Encounter (Signed)
-----   Message from Jonelle Sidle, MD sent at 01/23/2018  3:59 PM EDT ----- Results reviewed.  LVEF is normal range at 55 to 60%.  No wall motion abnormalities.  Aortic valve is mildly calcified but not stenotic.  We will follow-up with ischemic testing next. A copy of this test should be forwarded to Burdine, Ananias Pilgrim, MD.

## 2018-01-23 NOTE — Telephone Encounter (Signed)
Patient informed and copy sent to PCP. 

## 2018-02-06 ENCOUNTER — Encounter (HOSPITAL_COMMUNITY)
Admission: RE | Admit: 2018-02-06 | Discharge: 2018-02-06 | Disposition: A | Discharge status: Home / Self Care | Payer: BC Managed Care – PPO | Source: Ambulatory Visit | Attending: Cardiology | Admitting: Cardiology

## 2018-02-06 ENCOUNTER — Encounter (HOSPITAL_COMMUNITY): Payer: Self-pay

## 2018-02-06 DIAGNOSIS — R0609 Other forms of dyspnea: Secondary | ICD-10-CM | POA: Diagnosis not present

## 2018-02-06 DIAGNOSIS — R9431 Abnormal electrocardiogram [ECG] [EKG]: Secondary | ICD-10-CM | POA: Diagnosis not present

## 2018-02-06 LAB — NM MYOCAR MULTI W/SPECT W/WALL MOTION / EF
CHL CUP NUCLEAR SDS: 4
CHL CUP NUCLEAR SRS: 4
CHL CUP RESTING HR STRESS: 56 {beats}/min
LHR: 0.43
LV dias vol: 99 mL (ref 46–106)
LV sys vol: 36 mL
Peak HR: 83 {beats}/min
SSS: 8
TID: 1.04

## 2018-02-06 MED ORDER — REGADENOSON 0.4 MG/5ML IV SOLN
INTRAVENOUS | Status: AC
  Administered 2018-02-06: 0.4 mg via INTRAVENOUS
  Filled 2018-02-06: qty 5

## 2018-02-06 MED ORDER — SODIUM CHLORIDE 0.9% FLUSH
INTRAVENOUS | Status: AC
  Administered 2018-02-06: 10 mL via INTRAVENOUS
  Filled 2018-02-06: qty 10

## 2018-02-06 MED ORDER — TECHNETIUM TC 99M TETROFOSMIN IV KIT
30.0000 | PACK | Freq: Once | INTRAVENOUS | Status: AC | PRN
  Administered 2018-02-06: 31 via INTRAVENOUS

## 2018-02-06 MED ORDER — TECHNETIUM TC 99M TETROFOSMIN IV KIT
10.0000 | PACK | Freq: Once | INTRAVENOUS | Status: AC | PRN
  Administered 2018-02-06: 10.5 via INTRAVENOUS

## 2018-02-08 ENCOUNTER — Telehealth: Payer: Self-pay

## 2018-02-08 NOTE — Telephone Encounter (Signed)
-----   Message from Norva PavlovKailey Olympia Adelsberger, LPN sent at 1/61/09605/30/2019  4:23 PM EDT -----   ----- Message ----- From: Jonelle SidleMcDowell, Samuel G, MD Sent: 02/06/2018   4:23 PM To: Eustace MooreLydia M Anderson, LPN  Results reviewed.  Myoview study shows ischemic anterolateral defect consistent with underlying CAD.  She was describing dyspnea on exertion at her recent consultation and I would like to have her scheduled for a follow-up visit soon to discuss cardiac catheterization for further evaluation of her coronary anatomy.  Please see if she can be scheduled for an APP visit in [place]. A copy of this test should be forwarded to Burdine, Ananias PilgrimSteven E, MD.

## 2018-02-08 NOTE — Telephone Encounter (Signed)
Patient notified. Routed to PCP. Apt scheduled for 6/7. Patient requesting to see Dr. Diona Browner vs APP.

## 2018-02-12 NOTE — Progress Notes (Signed)
Cardiology Office Note  Date: 02/15/2018   ID: Leslie Cobb, DOB July 07, 1956, MRN [id number]  PCP: Juliette AlcideBurdine, Steven E, MD  Primary Cardiologist: Nona DellSamuel Ilena Dieckman, MD   Chief Complaint  Patient presents with  . Abnormal Myoview  . Dyspnea on exertion    History of Present Illness: Leslie Cobb is a 62 y.o. female seen recently in consultation in May for evaluation of dyspnea on exertion and intermittent chest discomfort.  Echocardiogram in May demonstrated mild LVH with LVEF 55 to 60%, no wall motion abnormalities, and sclerotic aortic valve without stenosis.  Lexiscan Myoview in May demonstrated nonspecific ST segment changes in the high lateral leads and AVR with anterolateral ischemic defect in the setting of breast attenuation.  We discussed these results today.  Light of her symptoms of dyspnea on exertion as well as intermittent chest tightness we discussed rationale for proceeding to a diagnostic cardiac catheterization to more clearly evaluate her coronary anatomy and assess perivascular sedation options.  We discussed the risks and benefits and she is in agreement to proceed.  Her current medical regimen as outlined below and includes aspirin, atenolol, HCTZ, Cozaar, Mevacor and omega-3 supplements.  Past Medical History:  Diagnosis Date  . Hyperlipidemia   . Hypertension   . Low back pain 08/06/2013  . Obesity   . Type 2 diabetes mellitus (HCC)   . Vaginal Pap smear, abnormal     Past Surgical History:  Procedure Laterality Date  . BREAST SURGERY Left   . GALLBLADDER SURGERY      Current Outpatient Medications  Medication Sig Dispense Refill  . aspirin (ST JOSEPH ASPIRIN) 81 MG EC tablet Take 81 mg by mouth daily. Swallow whole.    Marland Kitchen. atenolol (TENORMIN) 50 MG tablet Take 75 mg by mouth daily. Take 1 tablet in the morning and 1/2 tablet in the evening    . Cholecalciferol (VITAMIN D-3 PO) Take by mouth daily.    . Cyanocobalamin (VITAMIN B 12 PO)  Take by mouth. Twice weekly    . glipiZIDE (GLUCOTROL) 10 MG tablet Take 10 mg by mouth daily before breakfast.    . hydrochlorothiazide (HYDRODIURIL) 25 MG tablet Take 25 mg by mouth daily.    Marland Kitchen. losartan (COZAAR) 100 MG tablet Take 100 mg by mouth daily.    Marland Kitchen. lovastatin (MEVACOR) 20 MG tablet Take 20 mg by mouth daily.    . metFORMIN (GLUCOPHAGE) 500 MG tablet Take 1,500 mg by mouth daily with breakfast.    . Multiple Vitamin (MULTIVITAMIN) tablet Take 1 tablet by mouth daily.    . Omega-3 Fatty Acids (OMEGA 3 PO) Take by mouth daily.    . potassium chloride (K-DUR) 10 MEQ tablet Take 10 mEq by mouth daily.    Marland Kitchen. triamcinolone cream (KENALOG) 0.1 % 1 application as needed.      No current facility-administered medications for this visit.    Allergies:  Patient has no known allergies.   Social History: The patient  reports that she has never smoked. She has never used smokeless tobacco. She reports that she does not drink alcohol or use drugs.   Family History: [name] history includes Arthritis in her mother; Diabetes in her maternal aunt, maternal grandmother, and mother; Heart disease in her brother, brother, brother, father, and mother; Hypertension in her brother and mother; Kidney disease in her mother; Lung cancer in her father.   ROS:  Please see the history of present illness. Otherwise, complete review of systems is  positive for none.  All other systems are reviewed and negative.   Physical Exam: VS:  BP (!) 142/70   Pulse 60   Ht 5\' 4"  (1.626 m)   Wt 207 lb (93.9 kg)   SpO2 96%   BMI 35.53 kg/m , BMI Body mass index is 35.53 kg/m.  Wt Readings from Last 3 Encounters:  02/15/18 207 lb (93.9 kg)  01/14/18 207 lb (93.9 kg)  12/12/17 210 lb (95.3 kg)    General: Patient appears comfortable at rest. HEENT: Conjunctiva and lids normal, oropharynx clear. Neck: Supple, no elevated JVP or carotid bruits, no thyromegaly. Lungs: Clear to auscultation, nonlabored  breathing at rest. Cardiac: Regular rate and rhythm, no S3, 2/6 systolic murmur, no pericardial rub. Abdomen: Soft, nontender, bowel sounds present, no guarding or rebound. Extremities: No pitting edema, distal pulses 2+. Skin: Warm and dry. Musculoskeletal: No kyphosis. Neuropsychiatric: Alert and oriented x3, affect grossly appropriate.  ECG: I personally reviewed the tracing from 01/14/2018 which showed sinus bradycardia with decreased R wave progression.  Other Studies Reviewed Today:  Lexiscan Myoview 02/06/2018:  Nonspecific ST segment changes in the high lateral leads and aVR.  Medium sized, mild intensity, partially reversible anterolateral defect from mid to apical level consistent with ischemia although breast attenuation is also present.  This is an intermediate risk study.  Nuclear stress EF: 64%.  Echocardiogram 01/23/2018: Study Conclusions  - Left ventricle: The cavity size was normal. Wall thickness was   increased in a pattern of mild LVH. Systolic function was normal.   The estimated ejection fraction was in the range of 55% to 60%.   Wall motion was normal; there were no regional wall motion   abnormalities. - Aortic valve: Mildly to moderately calcified annulus. Trileaflet;   mildly calcified leaflets. - Mitral valve: There was trivial regurgitation. - Right atrium: Central venous pressure (est): 3 mm Hg. - Atrial septum: No defect or patent foramen ovale was identified. - Tricuspid valve: There was trivial regurgitation. - Pulmonary arteries: Systolic pressure could not be accurately   estimated. - Pericardium, extracardiac: A prominent pericardial fat pad was   present.  Assessment and Plan:  1.  Dyspnea on exertion as well as intermittent chest tightness concerning for angina.  Myoview was abnormal indicating anterolateral ischemia in the setting of breast attenuation.  She has a family history of premature CAD as well as personal risk factors including  hypertension, hyperlipidemia, and type 2 diabetes mellitus.  We have discussed rationale for proceeding to a diagnostic cardiac catheterization, including the risks and benefits, and she is in agreement to proceed.  2.  Systolic murmur with documented aortic valve sclerosis but no stenosis.  3.  Essential hypertension, currently on atenolol, HCTZ, and Cozaar.  She follows with Dr. Leandrew Koyanagi.  4.  Mixed hyperlipidemia on Mevacor and omega-3 supplements.  She follows with Dr. Leandrew Koyanagi.  5.  Type 2 diabetes mellitus, on Glucotrol and Glucophage.  She follows with Dr. Leandrew Koyanagi.  Current medicines were reviewed with the patient today.   Orders Placed This Encounter  Procedures  . CBC w/Diff/Platelet  . Basic Metabolic Panel (BMET)    Disposition: Follow-up after procedure.  Signed, Jonelle Sidle, MD, [facility] 02/15/2018 2:56 PM    [facility] at [facility] [address], [place], [place] [address] Phone: [phone]; Fax: [phone]

## 2018-02-15 ENCOUNTER — Other Ambulatory Visit: Payer: Self-pay | Admitting: Cardiology

## 2018-02-15 ENCOUNTER — Encounter: Payer: Self-pay | Admitting: Cardiology

## 2018-02-15 ENCOUNTER — Other Ambulatory Visit (HOSPITAL_COMMUNITY)
Admission: RE | Admit: 2018-02-15 | Discharge: 2018-02-15 | Disposition: A | Discharge status: Home / Self Care | Payer: BC Managed Care – PPO | Source: Ambulatory Visit | Attending: Cardiology | Admitting: Cardiology

## 2018-02-15 ENCOUNTER — Ambulatory Visit: Payer: BC Managed Care – PPO | Admitting: Cardiology

## 2018-02-15 VITALS — BP 142/70 | HR 60 | Ht 64.0 in | Wt 207.0 lb

## 2018-02-15 DIAGNOSIS — R0609 Other forms of dyspnea: Secondary | ICD-10-CM | POA: Diagnosis not present

## 2018-02-15 DIAGNOSIS — R9439 Abnormal result of other cardiovascular function study: Secondary | ICD-10-CM | POA: Insufficient documentation

## 2018-02-15 DIAGNOSIS — E782 Mixed hyperlipidemia: Secondary | ICD-10-CM

## 2018-02-15 DIAGNOSIS — I10 Essential (primary) hypertension: Secondary | ICD-10-CM

## 2018-02-15 DIAGNOSIS — E119 Type 2 diabetes mellitus without complications: Secondary | ICD-10-CM

## 2018-02-15 LAB — BASIC METABOLIC PANEL
ANION GAP: 10 (ref 5–15)
BUN: 20 mg/dL (ref 6–20)
CALCIUM: 9.5 mg/dL (ref 8.9–10.3)
CO2: 23 mmol/L (ref 22–32)
Chloride: 108 mmol/L (ref 101–111)
Creatinine, Ser: 0.81 mg/dL (ref 0.44–1.00)
Glucose, Bld: 90 mg/dL (ref 65–99)
Potassium: 3.6 mmol/L (ref 3.5–5.1)
Sodium: 141 mmol/L (ref 135–145)

## 2018-02-15 LAB — CBC WITH DIFFERENTIAL/PLATELET
BASOS ABS: 0.1 10*3/uL (ref 0.0–0.1)
BASOS PCT: 1 %
Eosinophils Absolute: 0.3 10*3/uL (ref 0.0–0.7)
Eosinophils Relative: 5 %
HEMATOCRIT: 37.8 % (ref 36.0–46.0)
HEMOGLOBIN: 12.4 g/dL (ref 12.0–15.0)
Lymphocytes Relative: 36 %
Lymphs Abs: 2.4 10*3/uL (ref 0.7–4.0)
MCH: 28.5 pg (ref 26.0–34.0)
MCHC: 32.8 g/dL (ref 30.0–36.0)
MCV: 86.9 fL (ref 78.0–100.0)
Monocytes Absolute: 0.5 10*3/uL (ref 0.1–1.0)
Monocytes Relative: 7 %
NEUTROS ABS: 3.5 10*3/uL (ref 1.7–7.7)
NEUTROS PCT: 51 %
Platelets: 222 10*3/uL (ref 150–400)
RBC: 4.35 MIL/uL (ref 3.87–5.11)
RDW: 13.8 % (ref 11.5–15.5)
WBC: 6.7 10*3/uL (ref 4.0–10.5)

## 2018-02-15 NOTE — Patient Instructions (Signed)
        [facility] [address] [place] [address] Dept: [phone] Loc: [phone]  Berenis Norva Pavlovlizabeth Cuello  02/15/2018  You are scheduled for a Cardiac Catheterization on Monday, June 10 with Dr.David Herbie BaltimoreHarding  1. [facility] at [facility]: [address] [place], [place] [address] at 5:30 AM (two hours before your procedure to ensure your preparation). Free valet parking service is available.   Special note: Every effort is made to have your procedure done on time. Please understand that emergencies sometimes delay scheduled procedures.  2. Diet: Do not eat or drink anything after midnight prior to your procedure except sips of water to take medications.  3. Labs:Get lab work today  4. Medication instructions in preparation for your procedure:    STOP Taking Metformin the DAY of the cath, and for 2 days after  HOLD Hydrochlorothiazide, Glipizide the morning of test    On the morning of your procedure, take your Aspirin and any morning medicines NOT listed above.  You may use sips of water.  5. Plan for one night stay--bring personal belongings. 6. Bring a current list of your medications and current insurance cards. 7. You MUST have a responsible person to drive you home. 8. Someone MUST be with you the first 24 hours after you arrive home or your discharge will be delayed. 9. Please wear clothes that are easy to get on and off and wear slip-on shoes.  Thank you for allowing us to care for you!   -- [facility]

## 2018-02-16 DIAGNOSIS — I209 Angina pectoris, unspecified: Secondary | ICD-10-CM | POA: Clinically undetermined

## 2018-02-16 DIAGNOSIS — R9439 Abnormal result of other cardiovascular function study: Secondary | ICD-10-CM | POA: Diagnosis present

## 2018-02-18 ENCOUNTER — Other Ambulatory Visit: Payer: Self-pay | Admitting: *Deleted

## 2018-02-18 ENCOUNTER — Inpatient Hospital Stay (HOSPITAL_COMMUNITY)
Admission: AD | Admit: 2018-02-18 | Discharge: 2018-02-25 | DRG: 234 | Disposition: A | Discharge status: Home / Self Care | Payer: BC Managed Care – PPO | Source: Ambulatory Visit | Attending: Thoracic Surgery (Cardiothoracic Vascular Surgery) | Admitting: Thoracic Surgery (Cardiothoracic Vascular Surgery)

## 2018-02-18 ENCOUNTER — Other Ambulatory Visit: Payer: Self-pay

## 2018-02-18 ENCOUNTER — Inpatient Hospital Stay (HOSPITAL_COMMUNITY)
Admission: AD | Disposition: A | Discharge status: Home / Self Care | Payer: Self-pay | Source: Ambulatory Visit | Attending: Thoracic Surgery (Cardiothoracic Vascular Surgery)

## 2018-02-18 ENCOUNTER — Ambulatory Visit (HOSPITAL_COMMUNITY): Payer: BC Managed Care – PPO

## 2018-02-18 ENCOUNTER — Encounter (HOSPITAL_COMMUNITY): Payer: Self-pay | Admitting: Cardiology

## 2018-02-18 DIAGNOSIS — I2510 Atherosclerotic heart disease of native coronary artery without angina pectoris: Secondary | ICD-10-CM | POA: Diagnosis present

## 2018-02-18 DIAGNOSIS — E119 Type 2 diabetes mellitus without complications: Secondary | ICD-10-CM | POA: Diagnosis present

## 2018-02-18 DIAGNOSIS — E785 Hyperlipidemia, unspecified: Secondary | ICD-10-CM | POA: Diagnosis present

## 2018-02-18 DIAGNOSIS — I10 Essential (primary) hypertension: Secondary | ICD-10-CM | POA: Diagnosis present

## 2018-02-18 DIAGNOSIS — R0600 Dyspnea, unspecified: Secondary | ICD-10-CM | POA: Diagnosis present

## 2018-02-18 DIAGNOSIS — Z79899 Other long term (current) drug therapy: Secondary | ICD-10-CM | POA: Diagnosis not present

## 2018-02-18 DIAGNOSIS — I25119 Atherosclerotic heart disease of native coronary artery with unspecified angina pectoris: Secondary | ICD-10-CM

## 2018-02-18 DIAGNOSIS — Z7982 Long term (current) use of aspirin: Secondary | ICD-10-CM

## 2018-02-18 DIAGNOSIS — D62 Acute posthemorrhagic anemia: Secondary | ICD-10-CM | POA: Diagnosis not present

## 2018-02-18 DIAGNOSIS — Z6835 Body mass index (BMI) 35.0-35.9, adult: Secondary | ICD-10-CM

## 2018-02-18 DIAGNOSIS — Z01810 Encounter for preprocedural cardiovascular examination: Secondary | ICD-10-CM | POA: Diagnosis not present

## 2018-02-18 DIAGNOSIS — I209 Angina pectoris, unspecified: Secondary | ICD-10-CM | POA: Clinically undetermined

## 2018-02-18 DIAGNOSIS — Z833 Family history of diabetes mellitus: Secondary | ICD-10-CM

## 2018-02-18 DIAGNOSIS — Z951 Presence of aortocoronary bypass graft: Secondary | ICD-10-CM

## 2018-02-18 DIAGNOSIS — I25110 Atherosclerotic heart disease of native coronary artery with unstable angina pectoris: Principal | ICD-10-CM | POA: Diagnosis present

## 2018-02-18 DIAGNOSIS — E669 Obesity, unspecified: Secondary | ICD-10-CM | POA: Diagnosis present

## 2018-02-18 DIAGNOSIS — Z7984 Long term (current) use of oral hypoglycemic drugs: Secondary | ICD-10-CM | POA: Diagnosis not present

## 2018-02-18 DIAGNOSIS — Z8249 Family history of ischemic heart disease and other diseases of the circulatory system: Secondary | ICD-10-CM | POA: Diagnosis not present

## 2018-02-18 DIAGNOSIS — E8770 Fluid overload, unspecified: Secondary | ICD-10-CM | POA: Diagnosis not present

## 2018-02-18 DIAGNOSIS — R9439 Abnormal result of other cardiovascular function study: Secondary | ICD-10-CM | POA: Diagnosis present

## 2018-02-18 DIAGNOSIS — R079 Chest pain, unspecified: Secondary | ICD-10-CM

## 2018-02-18 HISTORY — PX: LEFT HEART CATH AND CORONARY ANGIOGRAPHY: CATH118249

## 2018-02-18 LAB — GLUCOSE, CAPILLARY
GLUCOSE-CAPILLARY: 121 mg/dL — AB (ref 65–99)
Glucose-Capillary: 177 mg/dL — ABNORMAL HIGH (ref 65–99)
Glucose-Capillary: 154 mg/dL — ABNORMAL HIGH (ref 65–99)
Glucose-Capillary: 161 mg/dL — ABNORMAL HIGH (ref 65–99)

## 2018-02-18 SURGERY — LEFT HEART CATH AND CORONARY ANGIOGRAPHY
Anesthesia: LOCAL

## 2018-02-18 MED ORDER — VERAPAMIL HCL 2.5 MG/ML IV SOLN
INTRAVENOUS | Status: DC | PRN
  Administered 2018-02-18: 10 mL via INTRA_ARTERIAL

## 2018-02-18 MED ORDER — ACETAMINOPHEN 500 MG PO TABS
1000.0000 mg | ORAL_TABLET | Freq: Three times a day (TID) | ORAL | Status: DC | PRN
  Administered 2018-02-19 (×2): 1000 mg via ORAL
  Filled 2018-02-18 (×2): qty 2

## 2018-02-18 MED ORDER — LIDOCAINE HCL (PF) 1 % IJ SOLN
INTRAMUSCULAR | Status: DC | PRN
  Administered 2018-02-18: 2 mL

## 2018-02-18 MED ORDER — SODIUM CHLORIDE 0.9% FLUSH: 3.0000 mL | INTRAVENOUS | Status: DC | PRN

## 2018-02-18 MED ORDER — HEPARIN (PORCINE) IN NACL 2-0.9 UNITS/ML
INTRAMUSCULAR | Status: AC | PRN
  Administered 2018-02-18 (×2): 500 mL

## 2018-02-18 MED ORDER — SODIUM CHLORIDE 0.9% FLUSH: 3.0000 mL | Freq: Two times a day (BID) | INTRAVENOUS | Status: DC

## 2018-02-18 MED ORDER — VERAPAMIL HCL 2.5 MG/ML IV SOLN
INTRAVENOUS | Status: AC
  Filled 2018-02-18: qty 2

## 2018-02-18 MED ORDER — LOSARTAN POTASSIUM 50 MG PO TABS
100.0000 mg | ORAL_TABLET | Freq: Every day | ORAL | Status: DC
  Administered 2018-02-19: 100 mg via ORAL
  Filled 2018-02-18: qty 2

## 2018-02-18 MED ORDER — VITAMIN B-12 1000 MCG PO TABS
1000.0000 ug | ORAL_TABLET | ORAL | Status: DC
  Administered 2018-02-18 – 2018-02-25 (×3): 1000 ug via ORAL
  Filled 2018-02-18 (×2): qty 4
  Filled 2018-02-18: qty 1
  Filled 2018-02-18: qty 4
  Filled 2018-02-18: qty 1
  Filled 2018-02-18 (×3): qty 4

## 2018-02-18 MED ORDER — SODIUM CHLORIDE 0.9 % WEIGHT BASED INFUSION
3.0000 mL/kg/h | INTRAVENOUS | Status: DC
  Administered 2018-02-18: 3 mL/kg/h via INTRAVENOUS

## 2018-02-18 MED ORDER — ASPIRIN 81 MG PO CHEW: 81.0000 mg | CHEWABLE_TABLET | ORAL | Status: DC

## 2018-02-18 MED ORDER — MIDAZOLAM HCL 2 MG/2ML IJ SOLN
INTRAMUSCULAR | Status: DC | PRN
  Administered 2018-02-18: 1 mg via INTRAVENOUS

## 2018-02-18 MED ORDER — SALINE SPRAY 0.65 % NA SOLN
1.0000 | Freq: Every day | NASAL | Status: DC | PRN
  Filled 2018-02-18: qty 44

## 2018-02-18 MED ORDER — FENTANYL CITRATE (PF) 100 MCG/2ML IJ SOLN
INTRAMUSCULAR | Status: DC | PRN
  Administered 2018-02-18: 25 ug via INTRAVENOUS

## 2018-02-18 MED ORDER — SODIUM CHLORIDE 0.9% FLUSH
3.0000 mL | Freq: Two times a day (BID) | INTRAVENOUS | Status: DC
  Administered 2018-02-18: 3 mL via INTRAVENOUS

## 2018-02-18 MED ORDER — HEPARIN SODIUM (PORCINE) 1000 UNIT/ML IJ SOLN
INTRAMUSCULAR | Status: DC | PRN
  Administered 2018-02-18 (×2): 5000 [IU] via INTRAVENOUS

## 2018-02-18 MED ORDER — ATORVASTATIN CALCIUM 80 MG PO TABS
80.0000 mg | ORAL_TABLET | Freq: Every day | ORAL | Status: DC
  Administered 2018-02-18 – 2018-02-24 (×6): 80 mg via ORAL
  Filled 2018-02-18 (×6): qty 1

## 2018-02-18 MED ORDER — GLIPIZIDE ER 10 MG PO TB24
10.0000 mg | ORAL_TABLET | Freq: Every day | ORAL | Status: DC
  Administered 2018-02-18 – 2018-02-19 (×2): 10 mg via ORAL
  Filled 2018-02-18 (×2): qty 1

## 2018-02-18 MED ORDER — SODIUM CHLORIDE 0.9 % IV SOLN
250.0000 mL | INTRAVENOUS | Status: DC | PRN
  Administered 2018-02-19: 250 mL via INTRAVENOUS

## 2018-02-18 MED ORDER — POTASSIUM CHLORIDE CRYS ER 10 MEQ PO TBCR
10.0000 meq | EXTENDED_RELEASE_TABLET | Freq: Every day | ORAL | Status: DC
  Administered 2018-02-19: 10 meq via ORAL
  Filled 2018-02-18 (×3): qty 1

## 2018-02-18 MED ORDER — ADULT MULTIVITAMIN W/MINERALS CH
1.0000 | ORAL_TABLET | Freq: Every day | ORAL | Status: DC
  Administered 2018-02-18 – 2018-02-25 (×7): 1 via ORAL
  Filled 2018-02-18 (×10): qty 1

## 2018-02-18 MED ORDER — ASPIRIN EC 81 MG PO TBEC
81.0000 mg | DELAYED_RELEASE_TABLET | Freq: Every day | ORAL | Status: DC
  Administered 2018-02-18 – 2018-02-19 (×2): 81 mg via ORAL
  Filled 2018-02-18: qty 1

## 2018-02-18 MED ORDER — ATENOLOL 25 MG PO TABS
50.0000 mg | ORAL_TABLET | Freq: Every morning | ORAL | Status: DC
  Administered 2018-02-19: 50 mg via ORAL
  Filled 2018-02-18: qty 2

## 2018-02-18 MED ORDER — MORPHINE SULFATE (PF) 2 MG/ML IV SOLN: 2.0000 mg | INTRAVENOUS | Status: DC | PRN

## 2018-02-18 MED ORDER — INSULIN ASPART 100 UNIT/ML ~~LOC~~ SOLN
0.0000 [IU] | Freq: Three times a day (TID) | SUBCUTANEOUS | Status: DC
  Administered 2018-02-18 – 2018-02-19 (×4): 2 [IU] via SUBCUTANEOUS

## 2018-02-18 MED ORDER — ATENOLOL 25 MG PO TABS
25.0000 mg | ORAL_TABLET | Freq: Every evening | ORAL | Status: DC
  Administered 2018-02-18 – 2018-02-19 (×2): 25 mg via ORAL
  Filled 2018-02-18 (×2): qty 1

## 2018-02-18 MED ORDER — HYDROCHLOROTHIAZIDE 25 MG PO TABS
25.0000 mg | ORAL_TABLET | Freq: Every day | ORAL | Status: DC
  Administered 2018-02-18 – 2018-02-19 (×2): 25 mg via ORAL
  Filled 2018-02-18 (×2): qty 1

## 2018-02-18 MED ORDER — ASPIRIN 81 MG PO CHEW
CHEWABLE_TABLET | ORAL | Status: AC
  Filled 2018-02-18: qty 1

## 2018-02-18 MED ORDER — HEPARIN (PORCINE) IN NACL 100-0.45 UNIT/ML-% IJ SOLN
1350.0000 [IU]/h | INTRAMUSCULAR | Status: DC
  Administered 2018-02-18: 950 [IU]/h via INTRAVENOUS
  Administered 2018-02-19: 1100 [IU]/h via INTRAVENOUS
  Filled 2018-02-18 (×2): qty 250

## 2018-02-18 MED ORDER — MIDAZOLAM HCL 2 MG/2ML IJ SOLN
INTRAMUSCULAR | Status: AC
  Filled 2018-02-18: qty 2

## 2018-02-18 MED ORDER — SODIUM CHLORIDE 0.9 % IV SOLN
INTRAVENOUS | Status: AC
  Administered 2018-02-18: 14:00:00 via INTRAVENOUS

## 2018-02-18 MED ORDER — HEPARIN SODIUM (PORCINE) 1000 UNIT/ML IJ SOLN
INTRAMUSCULAR | Status: AC
  Filled 2018-02-18: qty 1

## 2018-02-18 MED ORDER — LIDOCAINE HCL (PF) 1 % IJ SOLN
INTRAMUSCULAR | Status: AC
  Filled 2018-02-18: qty 30

## 2018-02-18 MED ORDER — OMEGA-3-ACID ETHYL ESTERS 1 G PO CAPS
ORAL_CAPSULE | ORAL | Status: DC
  Administered 2018-02-18: 1 g via ORAL
  Filled 2018-02-18 (×4): qty 1

## 2018-02-18 MED ORDER — ONDANSETRON HCL 4 MG/2ML IJ SOLN: 4.0000 mg | Freq: Four times a day (QID) | INTRAMUSCULAR | Status: DC | PRN

## 2018-02-18 MED ORDER — SODIUM CHLORIDE 0.9 % WEIGHT BASED INFUSION: 1.0000 mL/kg/h | INTRAVENOUS | Status: DC

## 2018-02-18 MED ORDER — FENTANYL CITRATE (PF) 100 MCG/2ML IJ SOLN
INTRAMUSCULAR | Status: AC
  Filled 2018-02-18: qty 2

## 2018-02-18 MED ORDER — VITAMIN D 1000 UNITS PO TABS
1000.0000 [IU] | ORAL_TABLET | Freq: Every day | ORAL | Status: DC
  Administered 2018-02-18 – 2018-02-25 (×7): 1000 [IU] via ORAL
  Filled 2018-02-18 (×9): qty 1

## 2018-02-18 MED ORDER — SODIUM CHLORIDE 0.9 % IV SOLN: 250.0000 mL | INTRAVENOUS | Status: DC | PRN

## 2018-02-18 MED ORDER — HEPARIN (PORCINE) IN NACL 1000-0.9 UT/500ML-% IV SOLN
INTRAVENOUS | Status: AC
  Filled 2018-02-18: qty 1000

## 2018-02-18 MED ORDER — IOHEXOL 350 MG/ML SOLN
INTRAVENOUS | Status: DC | PRN
  Administered 2018-02-18: 55 mL via INTRA_ARTERIAL

## 2018-02-18 SURGICAL SUPPLY — 13 items
CATH INFINITI 5FR ANG PIGTAIL (CATHETERS) ×1 IMPLANT
CATH OPTITORQUE TIG 4.0 5F (CATHETERS) ×1 IMPLANT
CATH VISTA GUIDE 6FR XBLAD3.5 (CATHETERS) ×1 IMPLANT
DEVICE RAD COMP TR BAND LRG (VASCULAR PRODUCTS) ×1 IMPLANT
GLIDESHEATH SLEND A-KIT 6F 22G (SHEATH) ×1 IMPLANT
GUIDEWIRE INQWIRE 1.5J.035X260 (WIRE) IMPLANT
INQWIRE 1.5J .035X260CM (WIRE) ×2
KIT ENCORE 26 ADVANTAGE (KITS) ×1 IMPLANT
KIT HEART LEFT (KITS) ×2 IMPLANT
KIT HEMO VALVE WATCHDOG (MISCELLANEOUS) ×1 IMPLANT
PACK CARDIAC CATHETERIZATION (CUSTOM PROCEDURE TRAY) ×2 IMPLANT
TRANSDUCER W/STOPCOCK (MISCELLANEOUS) ×2 IMPLANT
TUBING CIL FLEX 10 FLL-RA (TUBING) ×2 IMPLANT

## 2018-02-18 NOTE — Research (Signed)
CADFEM Informed Consent   Subject Name: Leslie Cobb  Subject met inclusion and exclusion criteria.  The informed consent form, study requirements and expectations were reviewed with the subject and questions and concerns were addressed prior to the signing of the consent form.  The subject verbalized understanding of the trail requirements.  The subject agreed to participate in the CADFEM trial and signed the informed consent.  The informed consent was obtained prior to performance of any protocol-specific procedures for the subject.  A copy of the signed informed consent was given to the subject and a copy was placed in the subject's medical record.  [name] 02/18/2018, 06:57 AM

## 2018-02-18 NOTE — Consult Note (Signed)
Reason for Consult:3 vessel CAD Referring Physician: Dr. Harding/ McDowell  Leslie Cobb is an 62 y.o. female.  HPI: 62 yo woman presents with a cc/o dyspnea with exertion  Leslie Cobb is a 62 yo woman with multiple CRF including, type 2 diabetes without complication, hypertension, hyperlipidemia, obesity and a strong family history of CAD. She has no prior history of CAD herself. She has been having exertional chest tightness and shortness of breath for several months. She was referred to Dr. McDowell. An echo showed preserved LV function with aortic sclerosis. A Lexiscan Myoview showed NSST changes and an aterolateral reversible defect. She had cath today which revealed 3 vessel CAD. Symptoms have been exertional only, but have become more frequent since the stress test.  Past Medical History:  Diagnosis Date  . Hyperlipidemia   . Hypertension   . Low back pain 08/06/2013  . Obesity   . Type 2 diabetes mellitus (HCC)   . Vaginal Pap smear, abnormal     Past Surgical History:  Procedure Laterality Date  . BREAST SURGERY Left   . GALLBLADDER SURGERY    . LEFT HEART CATH AND CORONARY ANGIOGRAPHY N/A 02/18/2018   Procedure: LEFT HEART CATH AND CORONARY ANGIOGRAPHY;  Surgeon: Harding, David W, MD;  Location: [facility];  Service: Cardiovascular;  Laterality: N/A;    Family History  Problem Relation Age of Onset  . Diabetes Mother   . Hypertension Mother   . Heart disease Mother   . Arthritis Mother   . Kidney disease Mother   . Heart disease Father   . Lung cancer Father   . Hypertension Brother   . Heart disease Brother   . Diabetes Maternal Aunt   . Diabetes Maternal Grandmother   . Heart disease Brother   . Heart disease Brother     Social History:  reports that she has never smoked. She has never used smokeless tobacco. She reports that she does not drink alcohol or use drugs.  Allergies: No Known Allergies  Medications:  Prior to Admission:   Medications Prior to Admission  Medication Sig Dispense Refill Last Dose  . acetaminophen (TYLENOL) 500 MG tablet Take 1,000 mg by mouth every 8 (eight) hours as needed for moderate pain.   prn  . aspirin (ST JOSEPH ASPIRIN) 81 MG EC tablet Take 81 mg by mouth daily.    02/18/2018 at 0300  . atenolol (TENORMIN) 50 MG tablet Take 25-50 mg by mouth See admin instructions. Take 50mg in the morning and 25mg at night   02/18/2018 at 0300  . Chlorpheniramine-PSE-Ibuprofen (ADVIL ALLERGY SINUS PO) Take 1 tablet by mouth daily as needed (sinus).   prn  . Cholecalciferol (VITAMIN D-3) 1000 units CAPS Take 1,000 Units by mouth daily.    02/18/2018 at 0300  . Cyanocobalamin (VITAMIN B 12 PO) Take 1,000 mcg by mouth 2 (two) times a week. Saturday and Sunday   02/17/2018  . GLIPIZIDE XL 10 MG 24 hr tablet Take 10 mg by mouth at bedtime.  3 02/17/2018 at Unknown time  . hydrochlorothiazide (HYDRODIURIL) 25 MG tablet Take 25 mg by mouth daily.   02/17/2018  . losartan (COZAAR) 100 MG tablet Take 100 mg by mouth at bedtime.    02/18/2018 at 0300  . lovastatin (MEVACOR) 20 MG tablet Take 20 mg by mouth daily.   02/17/2018  . metFORMIN (GLUCOPHAGE) 500 MG tablet Take 750 mg by mouth 2 (two) times daily.    02/17/2018 at Unknown time  .   Multiple Vitamin (MULTIVITAMIN) tablet Take 1 tablet by mouth daily.   02/17/2018  . Omega-3 Fatty Acids (OMEGA 3 PO) Take 520 mg by mouth every other day.    02/17/2018  . potassium chloride (K-DUR) 10 MEQ tablet Take 10 mEq by mouth daily.   02/18/2018 at 0300  . sodium chloride (OCEAN) 0.65 % SOLN nasal spray Place 1 spray into both nostrils daily as needed for congestion.   prn    Results for orders placed or performed during the hospital encounter of 02/18/18 (from the past 48 hour(s))  Glucose, capillary     Status: Abnormal   Collection Time: 02/18/18  5:37 AM  Result Value Ref Range   Glucose-Capillary 177 (H) 65 - 99 mg/dL  Glucose, capillary     Status: Abnormal   Collection Time:  02/18/18  8:39 AM  Result Value Ref Range   Glucose-Capillary 154 (H) 65 - 99 mg/dL  Glucose, capillary     Status: Abnormal   Collection Time: 02/18/18  3:12 PM  Result Value Ref Range   Glucose-Capillary 121 (H) 65 - 99 mg/dL    No results found.  Review of Systems  Constitutional: Positive for malaise/fatigue. Negative for chills and fever.  Respiratory: Positive for shortness of breath. Negative for wheezing.   Cardiovascular: Positive for chest pain (tightness). Negative for orthopnea, claudication and leg swelling.  Musculoskeletal: Positive for back pain and joint pain.  Neurological: Negative for speech change, focal weakness and loss of consciousness.  All other systems reviewed and are negative.  Blood pressure (!) 160/80, pulse 60, temperature 98 F (36.7 C), temperature source Oral, resp. rate 18, height 5' 4" (1.626 m), weight 207 lb 6.4 oz (94.1 kg), SpO2 98 %. Physical Exam  Vitals reviewed. Constitutional: No distress.  Obese  HENT:  Head: Normocephalic and atraumatic.  Mouth/Throat: No oropharyngeal exudate.  Eyes: Conjunctivae and EOM are normal. No scleral icterus.  Neck: No thyromegaly present.  Cardiovascular: Normal rate and intact distal pulses.  Murmur (2/6 systolic) heard. Respiratory: Effort normal and breath sounds normal. No respiratory distress. She has no wheezes. She has no rales.  GI: Soft. She exhibits no distension. There is no tenderness.  Musculoskeletal: She exhibits no edema.  Lymphadenopathy:    She has no cervical adenopathy.  Neurological: She is alert. No cranial nerve deficit. She exhibits normal muscle tone.  Skin: Skin is warm and dry.   ECHOCARDIOGRAM 01/23/2018 Study Conclusions  - Left ventricle: The cavity size was normal. Wall thickness was   increased in a pattern of mild LVH. Systolic function was normal.   The estimated ejection fraction was in the range of 55% to 60%.   Wall motion was normal; there were no regional  wall motion   abnormalities. - Aortic valve: Mildly to moderately calcified annulus. Trileaflet;   mildly calcified leaflets. - Mitral valve: There was trivial regurgitation. - Right atrium: Central venous pressure (est): 3 mm Hg. - Atrial septum: No defect or patent foramen ovale was identified. - Tricuspid valve: There was trivial regurgitation. - Pulmonary arteries: Systolic pressure could not be accurately   estimated. - Pericardium, extracardiac: A prominent pericardial fat pad was   present.  CARDIAC CATHETERIZATION Conclusion     Ost LAD to Prox LAD lesion is 95% stenosed. Prox LAD lesion is 80% stenosed.  Ost Ramus to Ramus lesion is 90% stenosed. Ramus lesion is 45% stenosed. Lat Ramus lesion is 100% stenosed.  Prox Cx lesion is 85% stenosed. Mid Cx lesion   is 70% stenosed.  1st RPLB lesion is 95% stenosed.  The left ventricular systolic function is normal. The left ventricular ejection fraction is 55-65% by visual estimate.  LV end diastolic pressure is mildly elevated.    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 Workup reveals 3 vessel CAD with preserved LV function.   CABG indicated for survival benefit and relief of symptoms.  I discussed the general nature of the procedure, the need for general anesthesia, the use of cardiopulmonary bypass, and the incisions to be used with Leslie Cobb. We discussed the expected hospital stay, overall recovery and short and long term outcomes. I informed her of the indications, risks, benefits and alternatives. She understands the risks include, but are not limited to death, stroke, MI, DVT/PE, bleeding, possible need for transfusion, infections, cardiac  arrhythmias, as well as other organ system dysfunction including respiratory, renal, or GI complications.   She accepts the risks and agrees to proceed.  Plan OR later this week  Montreal Steidle C Fatisha Rabalais 02/18/2018, 7:16 PM     

## 2018-02-18 NOTE — Progress Notes (Signed)
ANTICOAGULATION CONSULT NOTE - Initial Consult  Pharmacy Consult for heparin Indication: chest pain/ACS  No Known Allergies  Patient Measurements: Height: 5\' 4"  (162.6 cm) Weight: 207 lb 6.4 oz (94.1 kg) IBW/kg (Calculated) : 54.7 Heparin Dosing Weight: 76 Kg  Vital Signs: Temp: 98.7 F (37.1 C) (06/10 1353) Temp Source: Oral (06/10 1353) BP: 163/70 (06/10 1353) Pulse Rate: 63 (06/10 1353)  Labs: Recent Labs    02/15/18 1420  HGB 12.4  HCT 37.8  PLT 222  CREATININE 0.81   Estimated Creatinine Clearance: 82.2 mL/min (by C-G formula based on SCr of 0.81 mg/dL).  Medical History: Past Medical History:  Diagnosis Date  . Hyperlipidemia   . Hypertension   . Low back pain 08/06/2013  . Obesity   . Type 2 diabetes mellitus (HCC)   . Vaginal Pap smear, abnormal    Medications:  Medications Prior to Admission  Medication Sig Dispense Refill Last Dose  . acetaminophen (TYLENOL) 500 MG tablet Take 1,000 mg by mouth every 8 (eight) hours as needed for moderate pain.   prn  . aspirin (ST JOSEPH ASPIRIN) 81 MG EC tablet Take 81 mg by mouth daily.    02/18/2018 at 0300  . atenolol (TENORMIN) 50 MG tablet Take 25-50 mg by mouth See admin instructions. Take 50mg  in the morning and 25mg  at night   02/18/2018 at 0300  . Chlorpheniramine-PSE-Ibuprofen (ADVIL ALLERGY SINUS PO) Take 1 tablet by mouth daily as needed (sinus).   prn  . Cholecalciferol (VITAMIN D-3) 1000 units CAPS Take 1,000 Units by mouth daily.    02/18/2018 at 0300  . Cyanocobalamin (VITAMIN B 12 PO) Take 1,000 mcg by mouth 2 (two) times a week. Saturday and Sunday   02/17/2018  . GLIPIZIDE XL 10 MG 24 hr tablet Take 10 mg by mouth at bedtime.  3 02/17/2018 at Unknown time  . hydrochlorothiazide (HYDRODIURIL) 25 MG tablet Take 25 mg by mouth daily.   02/17/2018  . losartan (COZAAR) 100 MG tablet Take 100 mg by mouth at bedtime.    02/18/2018 at 0300  . lovastatin (MEVACOR) 20 MG tablet Take 20 mg by mouth daily.   02/17/2018  .  metFORMIN (GLUCOPHAGE) 500 MG tablet Take 750 mg by mouth 2 (two) times daily.    02/17/2018 at Unknown time  . Multiple Vitamin (MULTIVITAMIN) tablet Take 1 tablet by mouth daily.   02/17/2018  . Omega-3 Fatty Acids (OMEGA 3 PO) Take 520 mg by mouth every other day.    02/17/2018  . potassium chloride (K-DUR) 10 MEQ tablet Take 10 mEq by mouth daily.   02/18/2018 at 0300  . sodium chloride (OCEAN) 0.65 % SOLN nasal spray Place 1 spray into both nostrils daily as needed for congestion.   prn    Assessment: [age] yoF s/p cardiac cath on 6/10 to resume heparin 8 hours post sheath removal for possible cardiac intervention. Sheath removed at 0820. CBC WNL on 02/15/18. TR band already off, will avoid bolus.  Goal of Therapy:  Heparin level 0.3-0.7 units/ml Monitor platelets by anticoagulation protocol: Yes   Plan:  Start heparin infusion at 950 units/hr at 1630 tonight Check anti-Xa level in 8 hours and daily while on heparin Continue to monitor H&H and platelets  Linh Hedberg L Jariah Tarkowski 02/18/2018,2:18 PM

## 2018-02-18 NOTE — H&P (View-Only) (Signed)
Reason for Consult:3 vessel CAD Referring Physician: Dr. Nonda LouHarding/ McDowell  Leslie Cobb is an 62 y.o. female.  HPI: 62 yo woman presents with a cc/o dyspnea with exertion  Leslie Cobb is a 62 yo woman with multiple CRF including, type 2 diabetes without complication, hypertension, hyperlipidemia, obesity and a strong family history of CAD. She has no prior history of CAD herself. She has been having exertional chest tightness and shortness of breath for several months. She was referred to Dr. Diona BrownerMcDowell. An echo showed preserved LV function with aortic sclerosis. A Lexiscan Myoview showed NSST changes and an aterolateral reversible defect. She had cath today which revealed 3 vessel CAD. Symptoms have been exertional only, but have become more frequent since the stress test.  Past Medical History:  Diagnosis Date  . Hyperlipidemia   . Hypertension   . Low back pain 08/06/2013  . Obesity   . Type 2 diabetes mellitus (HCC)   . Vaginal Pap smear, abnormal     Past Surgical History:  Procedure Laterality Date  . BREAST SURGERY Left   . GALLBLADDER SURGERY    . LEFT HEART CATH AND CORONARY ANGIOGRAPHY N/A 02/18/2018   Procedure: LEFT HEART CATH AND CORONARY ANGIOGRAPHY;  Surgeon: Marykay LexHarding, David W, MD;  [facility];  Service: Cardiovascular;  Laterality: N/A;    Family History  Problem Relation Age of Onset  . Diabetes Mother   . Hypertension Mother   . Heart disease Mother   . Arthritis Mother   . Kidney disease Mother   . Heart disease Father   . Lung cancer Father   . Hypertension Brother   . Heart disease Brother   . Diabetes Maternal Aunt   . Diabetes Maternal Grandmother   . Heart disease Brother   . Heart disease Brother     Social History:  reports that she has never smoked. She has never used smokeless tobacco. She reports that she does not drink alcohol or use drugs.  Allergies: No Known Allergies  Medications:  Prior to Admission:   Medications Prior to Admission  Medication Sig Dispense Refill Last Dose  . acetaminophen (TYLENOL) 500 MG tablet Take 1,000 mg by mouth every 8 (eight) hours as needed for moderate pain.   prn  . aspirin (ST JOSEPH ASPIRIN) 81 MG EC tablet Take 81 mg by mouth daily.    02/18/2018 at 0300  . atenolol (TENORMIN) 50 MG tablet Take 25-50 mg by mouth See admin instructions. Take 50mg  in the morning and 25mg  at night   02/18/2018 at 0300  . Chlorpheniramine-PSE-Ibuprofen (ADVIL ALLERGY SINUS PO) Take 1 tablet by mouth daily as needed (sinus).   prn  . Cholecalciferol (VITAMIN D-3) 1000 units CAPS Take 1,000 Units by mouth daily.    02/18/2018 at 0300  . Cyanocobalamin (VITAMIN B 12 PO) Take 1,000 mcg by mouth 2 (two) times a week. Saturday and Sunday   02/17/2018  . GLIPIZIDE XL 10 MG 24 hr tablet Take 10 mg by mouth at bedtime.  3 02/17/2018 at Unknown time  . hydrochlorothiazide (HYDRODIURIL) 25 MG tablet Take 25 mg by mouth daily.   02/17/2018  . losartan (COZAAR) 100 MG tablet Take 100 mg by mouth at bedtime.    02/18/2018 at 0300  . lovastatin (MEVACOR) 20 MG tablet Take 20 mg by mouth daily.   02/17/2018  . metFORMIN (GLUCOPHAGE) 500 MG tablet Take 750 mg by mouth 2 (two) times daily.    02/17/2018 at Unknown time  .  Multiple Vitamin (MULTIVITAMIN) tablet Take 1 tablet by mouth daily.   02/17/2018  . Omega-3 Fatty Acids (OMEGA 3 PO) Take 520 mg by mouth every other day.    02/17/2018  . potassium chloride (K-DUR) 10 MEQ tablet Take 10 mEq by mouth daily.   02/18/2018 at 0300  . sodium chloride (OCEAN) 0.65 % SOLN nasal spray Place 1 spray into both nostrils daily as needed for congestion.   prn    Results for orders placed or performed during the hospital encounter of 02/18/18 (from the past 48 hour(s))  Glucose, capillary     Status: Abnormal   Collection Time: 02/18/18  5:37 AM  Result Value Ref Range   Glucose-Capillary 177 (H) 65 - 99 mg/dL  Glucose, capillary     Status: Abnormal   Collection Time:  02/18/18  8:39 AM  Result Value Ref Range   Glucose-Capillary 154 (H) 65 - 99 mg/dL  Glucose, capillary     Status: Abnormal   Collection Time: 02/18/18  3:12 PM  Result Value Ref Range   Glucose-Capillary 121 (H) 65 - 99 mg/dL    No results found.  Review of Systems  Constitutional: Positive for malaise/fatigue. Negative for chills and fever.  Respiratory: Positive for shortness of breath. Negative for wheezing.   Cardiovascular: Positive for chest pain (tightness). Negative for orthopnea, claudication and leg swelling.  Musculoskeletal: Positive for back pain and joint pain.  Neurological: Negative for speech change, focal weakness and loss of consciousness.  All other systems reviewed and are negative.  Blood pressure (!) 160/80, pulse 60, temperature 98 F (36.7 C), temperature source Oral, resp. rate 18, height 5\' 4"  (1.626 m), weight 207 lb 6.4 oz (94.1 kg), SpO2 98 %. Physical Exam  Vitals reviewed. Constitutional: No distress.  Obese  HENT:  Head: Normocephalic and atraumatic.  Mouth/Throat: No oropharyngeal exudate.  Eyes: Conjunctivae and EOM are normal. No scleral icterus.  Neck: No thyromegaly present.  Cardiovascular: Normal rate and intact distal pulses.  Murmur (2/6 systolic) heard. Respiratory: Effort normal and breath sounds normal. No respiratory distress. She has no wheezes. She has no rales.  GI: Soft. She exhibits no distension. There is no tenderness.  Musculoskeletal: She exhibits no edema.  Lymphadenopathy:    She has no cervical adenopathy.  Neurological: She is alert. No cranial nerve deficit. She exhibits normal muscle tone.  Skin: Skin is warm and dry.   ECHOCARDIOGRAM 01/23/2018 Study Conclusions  - Left ventricle: The cavity size was normal. Wall thickness was   increased in a pattern of mild LVH. Systolic function was normal.   The estimated ejection fraction was in the range of 55% to 60%.   Wall motion was normal; there were no regional  wall motion   abnormalities. - Aortic valve: Mildly to moderately calcified annulus. Trileaflet;   mildly calcified leaflets. - Mitral valve: There was trivial regurgitation. - Right atrium: Central venous pressure (est): 3 mm Hg. - Atrial septum: No defect or patent foramen ovale was identified. - Tricuspid valve: There was trivial regurgitation. - Pulmonary arteries: Systolic pressure could not be accurately   estimated. - Pericardium, extracardiac: A prominent pericardial fat pad was   present.  CARDIAC CATHETERIZATION Conclusion     Ost LAD to Prox LAD lesion is 95% stenosed. Prox LAD lesion is 80% stenosed.  Ost Ramus to Ramus lesion is 90% stenosed. Ramus lesion is 45% stenosed. Lat Ramus lesion is 100% stenosed.  Prox Cx lesion is 85% stenosed. Mid Cx lesion  is 70% stenosed.  1st RPLB lesion is 95% stenosed.  The left ventricular systolic function is normal. The left ventricular ejection fraction is 55-65% by visual estimate.  LV end diastolic pressure is mildly elevated.    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 Workup reveals 3 vessel CAD with preserved LV function.   CABG indicated for survival benefit and relief of symptoms.  I discussed the general nature of the procedure, the need for general anesthesia, the use of cardiopulmonary bypass, and the incisions to be used with Mrs. Manson Passey. We discussed the expected hospital stay, overall recovery and short and long term outcomes. I informed her of the indications, risks, benefits and alternatives. She understands the risks include, but are not limited to death, stroke, MI, DVT/PE, bleeding, possible need for transfusion, infections, cardiac  arrhythmias, as well as other organ system dysfunction including respiratory, renal, or GI complications.   She accepts the risks and agrees to proceed.  Plan OR later this week  Loreli Slot 02/18/2018, 7:16 PM

## 2018-02-18 NOTE — Progress Notes (Signed)
Pt received from cardiac cath with RHC, from rt radial, TR band off at 11.30 am, pt is ambulatory this point.vitals taken frequently, BP is in higher side but is covering with antihypertensives, pt started to eat HH diet, CBG taken and sliding scale insulin provided as ordered, family member in bed side and is updating,   Heparin started at 9.5cc/hr,  Denies CP and SOB Will continue to monitor  Lonia Farberekha, RN

## 2018-02-19 ENCOUNTER — Inpatient Hospital Stay (HOSPITAL_COMMUNITY): Payer: BC Managed Care – PPO

## 2018-02-19 DIAGNOSIS — Z01810 Encounter for preprocedural cardiovascular examination: Secondary | ICD-10-CM

## 2018-02-19 DIAGNOSIS — I25110 Atherosclerotic heart disease of native coronary artery with unstable angina pectoris: Principal | ICD-10-CM

## 2018-02-19 DIAGNOSIS — R9439 Abnormal result of other cardiovascular function study: Secondary | ICD-10-CM

## 2018-02-19 LAB — HEPARIN LEVEL (UNFRACTIONATED)
Heparin Unfractionated: 0.1 IU/mL — ABNORMAL LOW (ref 0.30–0.70)
Heparin Unfractionated: 0.12 IU/mL — ABNORMAL LOW (ref 0.30–0.70)
Heparin Unfractionated: 0.3 IU/mL (ref 0.30–0.70)

## 2018-02-19 LAB — PULMONARY FUNCTION TEST
DL/VA % PRED: 108 %
DL/VA: 5.19 ml/min/mmHg/L
DLCO UNC % PRED: 70 %
DLCO cor % pred: 76 %
DLCO cor: 18.6 ml/min/mmHg
DLCO unc: 17.07 ml/min/mmHg
FEF 25-75 PRE: 2.54 L/s
FEF 25-75 Post: 3.08 L/sec
FEF2575-%Change-Post: 21 %
FEF2575-%Pred-Post: 131 %
FEF2575-%Pred-Pre: 108 %
FEV1-%CHANGE-POST: 7 %
FEV1-%PRED-POST: 86 %
FEV1-%PRED-PRE: 80 %
FEV1-PRE: 2.06 L
FEV1-Post: 2.21 L
FEV1FVC-%Change-Post: 4 %
FEV1FVC-%Pred-Pre: 109 %
FEV6-%Change-Post: 2 %
FEV6-%PRED-POST: 77 %
FEV6-%Pred-Pre: 75 %
FEV6-POST: 2.47 L
FEV6-PRE: 2.41 L
FEV6FVC-%PRED-POST: 104 %
FEV6FVC-%Pred-Pre: 104 %
FVC-%Change-Post: 2 %
FVC-%Pred-Post: 74 %
FVC-%Pred-Pre: 73 %
FVC-Post: 2.47 L
FVC-Pre: 2.41 L
POST FEV6/FVC RATIO: 100 %
PRE FEV6/FVC RATIO: 100 %
Post FEV1/FVC ratio: 89 %
Pre FEV1/FVC ratio: 85 %
RV % PRED: 87 %
RV: 1.74 L
TLC % PRED: 85 %
TLC: 4.31 L

## 2018-02-19 LAB — BASIC METABOLIC PANEL
Anion gap: 9 (ref 5–15)
BUN: 12 mg/dL (ref 6–20)
CHLORIDE: 103 mmol/L (ref 101–111)
CO2: 26 mmol/L (ref 22–32)
CREATININE: 1.02 mg/dL — AB (ref 0.44–1.00)
Calcium: 9.5 mg/dL (ref 8.9–10.3)
GFR calc Af Amer: >60 mL/min (ref >60)
GFR calc non Af Amer: 59 mL/min — ABNORMAL LOW (ref >60)
Glucose, Bld: 209 mg/dL — ABNORMAL HIGH (ref 65–99)
POTASSIUM: 3.6 mmol/L (ref 3.5–5.1)
Sodium: 138 mmol/L (ref 135–145)

## 2018-02-19 LAB — CBC
HCT: 32.1 % — ABNORMAL LOW (ref 36.0–46.0)
HEMATOCRIT: 34 % — AB (ref 36.0–46.0)
HEMATOCRIT: 34 % — AB (ref 36.0–46.0)
HEMOGLOBIN: 11 g/dL — AB (ref 12.0–15.0)
HEMOGLOBIN: 11.4 g/dL — AB (ref 12.0–15.0)
Hemoglobin: 11.5 g/dL — ABNORMAL LOW (ref 12.0–15.0)
MCH: 29.3 pg (ref 26.0–34.0)
MCH: 29 pg (ref 26.0–34.0)
MCH: 29 pg (ref 26.0–34.0)
MCHC: 34.3 g/dL (ref 30.0–36.0)
MCHC: 33.8 g/dL (ref 30.0–36.0)
MCHC: 33.5 g/dL (ref 30.0–36.0)
MCV: 85.4 fL (ref 78.0–100.0)
MCV: 85.9 fL (ref 78.0–100.0)
MCV: 86.5 fL (ref 78.0–100.0)
Platelets: 187 10*3/uL (ref 150–400)
Platelets: 197 10*3/uL (ref 150–400)
Platelets: 206 10*3/uL (ref 150–400)
RBC: 3.76 MIL/uL — ABNORMAL LOW (ref 3.87–5.11)
RBC: 3.96 MIL/uL (ref 3.87–5.11)
RBC: 3.93 MIL/uL (ref 3.87–5.11)
RDW: 13.7 % (ref 11.5–15.5)
RDW: 13.6 % (ref 11.5–15.5)
RDW: 13.6 % (ref 11.5–15.5)
WBC: 6.7 10*3/uL (ref 4.0–10.5)
WBC: 6 10*3/uL (ref 4.0–10.5)
WBC: 6.7 10*3/uL (ref 4.0–10.5)

## 2018-02-19 LAB — GLUCOSE, CAPILLARY
GLUCOSE-CAPILLARY: 181 mg/dL — AB (ref 65–99)
GLUCOSE-CAPILLARY: 130 mg/dL — AB (ref 65–99)
GLUCOSE-CAPILLARY: 124 mg/dL — AB (ref 65–99)
GLUCOSE-CAPILLARY: 185 mg/dL — AB (ref 65–99)

## 2018-02-19 LAB — HEMOGLOBIN A1C
Hgb A1c MFr Bld: 7 % — ABNORMAL HIGH (ref 4.8–5.6)
MEAN PLASMA GLUCOSE: 154.2 mg/dL

## 2018-02-19 LAB — PROTIME-INR
INR: 1.04
Prothrombin Time: 13.5 seconds (ref 11.4–15.2)

## 2018-02-19 LAB — ABO/RH: ABO/RH(D): O POS

## 2018-02-19 MED ORDER — PLASMA-LYTE 148 IV SOLN
INTRAVENOUS | Status: AC
  Administered 2018-02-20: 500 mL
  Filled 2018-02-19: qty 2.5

## 2018-02-19 MED ORDER — BISACODYL 5 MG PO TBEC
5.0000 mg | DELAYED_RELEASE_TABLET | Freq: Once | ORAL | Status: AC
  Administered 2018-02-19: 5 mg via ORAL
  Filled 2018-02-19: qty 1

## 2018-02-19 MED ORDER — TRANEXAMIC ACID (OHS) BOLUS VIA INFUSION
15.0000 mg/kg | INTRAVENOUS | Status: DC
  Filled 2018-02-19: qty 1413

## 2018-02-19 MED ORDER — SODIUM CHLORIDE 0.9 % IV SOLN
INTRAVENOUS | Status: AC
  Administered 2018-02-20: 1.7 [IU]/h via INTRAVENOUS
  Filled 2018-02-19: qty 1

## 2018-02-19 MED ORDER — TEMAZEPAM 15 MG PO CAPS
15.0000 mg | ORAL_CAPSULE | Freq: Once | ORAL | Status: AC | PRN
  Administered 2018-02-19: 15 mg via ORAL
  Filled 2018-02-19: qty 1

## 2018-02-19 MED ORDER — TRANEXAMIC ACID (OHS) PUMP PRIME SOLUTION
2.0000 mg/kg | INTRAVENOUS | Status: DC
  Filled 2018-02-19: qty 1.88

## 2018-02-19 MED ORDER — DOPAMINE-DEXTROSE 3.2-5 MG/ML-% IV SOLN
0.0000 ug/kg/min | INTRAVENOUS | Status: DC
  Filled 2018-02-19: qty 250

## 2018-02-19 MED ORDER — ALBUTEROL SULFATE (2.5 MG/3ML) 0.083% IN NEBU
2.5000 mg | INHALATION_SOLUTION | Freq: Once | RESPIRATORY_TRACT | Status: AC
  Administered 2018-02-19: 2.5 mg via RESPIRATORY_TRACT

## 2018-02-19 MED ORDER — MAGNESIUM SULFATE 50 % IJ SOLN
40.0000 meq | INTRAMUSCULAR | Status: DC
  Filled 2018-02-19: qty 9.85

## 2018-02-19 MED ORDER — EPINEPHRINE PF 1 MG/ML IJ SOLN
0.0000 ug/min | INTRAMUSCULAR | Status: DC
  Filled 2018-02-19: qty 4

## 2018-02-19 MED ORDER — CHLORHEXIDINE GLUCONATE CLOTH 2 % EX PADS
6.0000 | MEDICATED_PAD | Freq: Once | CUTANEOUS | Status: AC
  Administered 2018-02-19: 6 via TOPICAL

## 2018-02-19 MED ORDER — SODIUM CHLORIDE 0.9 % IV SOLN
750.0000 mg | INTRAVENOUS | Status: DC
  Filled 2018-02-19: qty 750

## 2018-02-19 MED ORDER — CHLORHEXIDINE GLUCONATE 0.12 % MT SOLN
15.0000 mL | Freq: Once | OROMUCOSAL | Status: AC
  Administered 2018-02-20: 15 mL via OROMUCOSAL
  Filled 2018-02-19: qty 15

## 2018-02-19 MED ORDER — POTASSIUM CHLORIDE 2 MEQ/ML IV SOLN
80.0000 meq | INTRAVENOUS | Status: DC
  Filled 2018-02-19: qty 40

## 2018-02-19 MED ORDER — DEXMEDETOMIDINE HCL IN NACL 400 MCG/100ML IV SOLN
0.1000 ug/kg/h | INTRAVENOUS | Status: AC
  Administered 2018-02-20: .2 ug/kg/h via INTRAVENOUS
  Filled 2018-02-19: qty 100

## 2018-02-19 MED ORDER — CHLORHEXIDINE GLUCONATE CLOTH 2 % EX PADS: 6.0000 | MEDICATED_PAD | Freq: Once | CUTANEOUS | Status: AC

## 2018-02-19 MED ORDER — SODIUM CHLORIDE 0.9 % IV SOLN
30.0000 ug/min | INTRAVENOUS | Status: AC
  Administered 2018-02-20: 20 ug/min via INTRAVENOUS
  Filled 2018-02-19: qty 2

## 2018-02-19 MED ORDER — TRANEXAMIC ACID 1000 MG/10ML IV SOLN
1.5000 mg/kg/h | INTRAVENOUS | Status: AC
  Administered 2018-02-20: 15 mg/kg/h via INTRAVENOUS
  Filled 2018-02-19: qty 25

## 2018-02-19 MED ORDER — MILRINONE LACTATE IN DEXTROSE 20-5 MG/100ML-% IV SOLN
0.1250 ug/kg/min | INTRAVENOUS | Status: DC
  Filled 2018-02-19: qty 100

## 2018-02-19 MED ORDER — DIAZEPAM 5 MG PO TABS
5.0000 mg | ORAL_TABLET | Freq: Once | ORAL | Status: AC
  Administered 2018-02-20: 5 mg via ORAL
  Filled 2018-02-19: qty 1

## 2018-02-19 MED ORDER — SODIUM CHLORIDE 0.9 % IV SOLN
1.5000 g | INTRAVENOUS | Status: AC
  Administered 2018-02-20: 1.5 g via INTRAVENOUS
  Administered 2018-02-20: .75 g via INTRAVENOUS
  Filled 2018-02-19: qty 1.5

## 2018-02-19 MED ORDER — NITROGLYCERIN IN D5W 200-5 MCG/ML-% IV SOLN
2.0000 ug/min | INTRAVENOUS | Status: AC
  Administered 2018-02-20: 5 ug/min via INTRAVENOUS
  Filled 2018-02-19: qty 250

## 2018-02-19 MED ORDER — METOPROLOL TARTRATE 12.5 MG HALF TABLET
12.5000 mg | ORAL_TABLET | Freq: Once | ORAL | Status: AC
  Administered 2018-02-20: 12.5 mg via ORAL
  Filled 2018-02-19: qty 1

## 2018-02-19 MED ORDER — SODIUM CHLORIDE 0.9 % IV SOLN
INTRAVENOUS | Status: DC
  Filled 2018-02-19: qty 30

## 2018-02-19 MED ORDER — VANCOMYCIN HCL 10 G IV SOLR
1500.0000 mg | INTRAVENOUS | Status: AC
  Administered 2018-02-20: 1500 mg via INTRAVENOUS
  Filled 2018-02-19: qty 1500

## 2018-02-19 MED FILL — Heparin Sod (Porcine)-NaCl IV Soln 1000 Unit/500ML-0.9%: INTRAVENOUS | Qty: 1000 | Status: AC

## 2018-02-19 NOTE — Progress Notes (Signed)
1 Day Post-Op Procedure(s) (LRB): LEFT HEART CATH AND CORONARY ANGIOGRAPHY (N/A) Subjective: No CP today  Objective: Vital signs in last 24 hours: Temp:  [97.8 F (36.6 C)-98.2 F (36.8 C)] 98.2 F (36.8 C) (06/11 1208) Pulse Rate:  [58-70] 58 (06/11 1208) Cardiac Rhythm: Normal sinus rhythm (06/11 0700) Resp:  [18-20] 18 (06/11 1208) BP: (143-182)/(65-79) 182/71 (06/11 1208) SpO2:  [96 %-99 %] 99 % (06/11 1208) Weight:  [207 lb 9.6 oz (94.2 kg)] 207 lb 9.6 oz (94.2 kg) (06/11 0431)  Hemodynamic parameters for last 24 hours:    Intake/Output from previous day: 06/10 0701 - 06/11 0700 In: 962.8 [P.O.:720; I.V.:242.8] Out: 1750 [Urine:1750] Intake/Output this shift: Total I/O In: 600 [P.O.:600] Out: 600 [Urine:600]  General appearance: alert, cooperative and no distress Neurologic: intact Heart: regular rate and rhythm Lungs: clear to auscultation bilaterally  Lab Results: Recent Labs    02/19/18 0107 02/19/18 0622  WBC 6.7 6.0  HGB 11.0* 11.5*  HCT 32.1* 34.0*  PLT 187 197   BMET: No results for input(s): NA, K, CL, CO2, GLUCOSE, BUN, CREATININE, CALCIUM in the last 72 hours.  PT/INR: No results for input(s): LABPROT, INR in the last 72 hours. ABG No results found for: PHART, HCO3, TCO2, ACIDBASEDEF, O2SAT CBG (last 3)  Recent Labs    02/19/18 0803 02/19/18 1154 02/19/18 1601  GLUCAP 181* 130* 124*    Assessment/Plan: S/P Procedure(s) (LRB): LEFT HEART CATH AND CORONARY ANGIOGRAPHY (N/A) -Severe 3 vessel CAD with class III angina For CABG tomorrow She is aware of the indications, risks, benefits and alternatives She accepts the risks and agrees to proceed Vascular studies showed > 50% decrease in palmar waveform on left with radial compression No significant carotid stenosis   LOS: 1 day    Loreli SlotSteven C Kiyona Mcnall 02/19/2018

## 2018-02-19 NOTE — Progress Notes (Signed)
ANTICOAGULATION CONSULT NOTE    Pharmacy Consult for heparin Indication: chest pain/ACS  No Known Allergies  Patient Measurements: Height: 5\' 4"  (162.6 cm) Weight: 207 lb 9.6 oz (94.2 kg)(scale b) IBW/kg (Calculated) : 54.7 Heparin Dosing Weight: 76 Kg  Vital Signs: Temp: 97.8 F (36.6 C) (06/11 0431) Temp Source: Oral (06/11 0431) BP: 149/65 (06/11 0854) Pulse Rate: 70 (06/11 0854)  Labs: Recent Labs    02/19/18 0107 02/19/18 0622 02/19/18 1016  HGB 11.0* 11.5*  --   HCT 32.1* 34.0*  --   PLT 187 197  --   HEPARINUNFRC 0.10*  --  0.12*   Estimated Creatinine Clearance: 82.2 mL/min (by C-G formula based on SCr of 0.81 mg/dL).  Medical History: Past Medical History:  Diagnosis Date  . Hyperlipidemia   . Hypertension   . Low back pain 08/06/2013  . Obesity   . Type 2 diabetes mellitus (HCC)   . Vaginal Pap smear, abnormal    Medications:  Medications Prior to Admission  Medication Sig Dispense Refill Last Dose  . acetaminophen (TYLENOL) 500 MG tablet Take 1,000 mg by mouth every 8 (eight) hours as needed for moderate pain.   prn  . aspirin (ST JOSEPH ASPIRIN) 81 MG EC tablet Take 81 mg by mouth daily.    02/18/2018 at 0300  . atenolol (TENORMIN) 50 MG tablet Take 25-50 mg by mouth See admin instructions. Take 50mg  in the morning and 25mg  at night   02/18/2018 at 0300  . Chlorpheniramine-PSE-Ibuprofen (ADVIL ALLERGY SINUS PO) Take 1 tablet by mouth daily as needed (sinus).   prn  . Cholecalciferol (VITAMIN D-3) 1000 units CAPS Take 1,000 Units by mouth daily.    02/18/2018 at 0300  . Cyanocobalamin (VITAMIN B 12 PO) Take 1,000 mcg by mouth 2 (two) times a week. Saturday and Sunday   02/17/2018  . GLIPIZIDE XL 10 MG 24 hr tablet Take 10 mg by mouth at bedtime.  3 02/17/2018 at Unknown time  . hydrochlorothiazide (HYDRODIURIL) 25 MG tablet Take 25 mg by mouth daily.   02/17/2018  . losartan (COZAAR) 100 MG tablet Take 100 mg by mouth at bedtime.    02/18/2018 at 0300  .  lovastatin (MEVACOR) 20 MG tablet Take 20 mg by mouth daily.   02/17/2018  . metFORMIN (GLUCOPHAGE) 500 MG tablet Take 750 mg by mouth 2 (two) times daily.    02/17/2018 at Unknown time  . Multiple Vitamin (MULTIVITAMIN) tablet Take 1 tablet by mouth daily.   02/17/2018  . Omega-3 Fatty Acids (OMEGA 3 PO) Take 520 mg by mouth every other day.    02/17/2018  . potassium chloride (K-DUR) 10 MEQ tablet Take 10 mEq by mouth daily.   02/18/2018 at 0300  . sodium chloride (OCEAN) 0.65 % SOLN nasal spray Place 1 spray into both nostrils daily as needed for congestion.   prn    Assessment: [age] yoF s/p cardiac cath on 6/10 awaiting CABG on 6/12. HgB remains low but stable, no overt bleeding reported  Heparin level subtherapeutic: 0.12 after rate increase overnight, no infusion issues reported  Goal of Therapy:  Heparin level 0.3-0.7 units/ml Monitor platelets by anticoagulation protocol: Yes   Plan:  Increase heparin infusion to 1350 units/hr Check anti-Xa level in 6 hours and daily while on heparin Continue to monitor H&H and platelets  Leslie Cobb [name] 02/19/2018,11:48 AM

## 2018-02-19 NOTE — Progress Notes (Signed)
Pre-CABG testing has been completed. 1-39% ICA stenosis bilaterally.  ABI's  Right 1.26 Left 1.19  Palmar waveforms Right Palmar waveforms are obliterated with radial and ulnar compression. Left Palmar waveforms are diminished greater than fifty percent with radial and ulnar compression.  02/19/18 10:04 AM Olen CordialGreg Sayeed Weatherall RVT

## 2018-02-19 NOTE — Progress Notes (Signed)
Pt off the unit for testing.

## 2018-02-19 NOTE — Progress Notes (Signed)
[phone] Discussed with pt the importance of mobility and IS after surgery. Gave IS and pt demonstrated 1250-1500 ml correctly. Gave OHS booklet, care guide , and in the tube handout. Discussed sternal precautions. fwrote down how to view pre op video. Discussed with pt the need to have someone with her after discharge 24/7 first week. Daughter in room during ed. Will follow up after surgery. Luetta NuttingCharlene Broedy Osbourne RN BSN 02/19/2018 11:41 AM

## 2018-02-19 NOTE — Progress Notes (Signed)
Heparin drip verified by Michele RockersLondra RN

## 2018-02-19 NOTE — Progress Notes (Signed)
PA Gold stated Alla GermanVantright is in the office tomorrow and cannot take over the case unless it is postponed. Informed pt family, pt family understanding and does not want to delay care

## 2018-02-19 NOTE — Progress Notes (Signed)
PA Su Hiltoberts aware of pt cardiac cath site bruising and stated to call cardiothoracic regarding family concern Paged PA Gold to inform of pt family request

## 2018-02-19 NOTE — Progress Notes (Signed)
Pt family at bedside requesting [facility] for OHS Paged PA Su HiltRoberts to inform

## 2018-02-19 NOTE — Plan of Care (Signed)
  Problem: Activity: Goal: Risk for activity intolerance will decrease Outcome: Progressing   Problem: Elimination: Goal: Will not experience complications related to bowel motility Outcome: Progressing   Problem: Safety: Goal: Ability to remain free from injury will improve Outcome: Progressing   

## 2018-02-19 NOTE — Progress Notes (Signed)
Called pt daughter to inform that cardiothoracic surgery is at bedside  Daughter aware and on the way

## 2018-02-19 NOTE — Progress Notes (Signed)
ANTICOAGULATION CONSULT NOTE   Pharmacy Consult for Heparin Indication: chest pain/ACS, s/p cath awaiting CABG   No Known Allergies  Patient Measurements: Height: 5\' 4"  (162.6 cm) Weight: 207 lb 6.4 oz (94.1 kg) IBW/kg (Calculated) : 54.7 Heparin Dosing Weight: 76 Kg  Vital Signs: Temp: 98 F (36.7 C) (06/11 0002) Temp Source: Oral (06/11 0002) BP: 166/79 (06/11 0002) Pulse Rate: 59 (06/11 0002)  Labs: Recent Labs    02/19/18 0107  HGB 11.0*  HCT 32.1*  PLT 187  HEPARINUNFRC 0.10*   Estimated Creatinine Clearance: 82.2 mL/min (by C-G formula based on SCr of 0.81 mg/dL).  Medical History: Past Medical History:  Diagnosis Date  . Hyperlipidemia   . Hypertension   . Low back pain 08/06/2013  . Obesity   . Type 2 diabetes mellitus (HCC)   . Vaginal Pap smear, abnormal    Medications:  Medications Prior to Admission  Medication Sig Dispense Refill Last Dose  . acetaminophen (TYLENOL) 500 MG tablet Take 1,000 mg by mouth every 8 (eight) hours as needed for moderate pain.   prn  . aspirin (ST JOSEPH ASPIRIN) 81 MG EC tablet Take 81 mg by mouth daily.    02/18/2018 at 0300  . atenolol (TENORMIN) 50 MG tablet Take 25-50 mg by mouth See admin instructions. Take 50mg  in the morning and 25mg  at night   02/18/2018 at 0300  . Chlorpheniramine-PSE-Ibuprofen (ADVIL ALLERGY SINUS PO) Take 1 tablet by mouth daily as needed (sinus).   prn  . Cholecalciferol (VITAMIN D-3) 1000 units CAPS Take 1,000 Units by mouth daily.    02/18/2018 at 0300  . Cyanocobalamin (VITAMIN B 12 PO) Take 1,000 mcg by mouth 2 (two) times a week. Saturday and Sunday   02/17/2018  . GLIPIZIDE XL 10 MG 24 hr tablet Take 10 mg by mouth at bedtime.  3 02/17/2018 at Unknown time  . hydrochlorothiazide (HYDRODIURIL) 25 MG tablet Take 25 mg by mouth daily.   02/17/2018  . losartan (COZAAR) 100 MG tablet Take 100 mg by mouth at bedtime.    02/18/2018 at 0300  . lovastatin (MEVACOR) 20 MG tablet Take 20 mg by mouth daily.    02/17/2018  . metFORMIN (GLUCOPHAGE) 500 MG tablet Take 750 mg by mouth 2 (two) times daily.    02/17/2018 at Unknown time  . Multiple Vitamin (MULTIVITAMIN) tablet Take 1 tablet by mouth daily.   02/17/2018  . Omega-3 Fatty Acids (OMEGA 3 PO) Take 520 mg by mouth every other day.    02/17/2018  . potassium chloride (K-DUR) 10 MEQ tablet Take 10 mEq by mouth daily.   02/18/2018 at 0300  . sodium chloride (OCEAN) 0.65 % SOLN nasal spray Place 1 spray into both nostrils daily as needed for congestion.   prn    Assessment: [age] yoF s/p cardiac cath on 6/10 to resume heparin 8 hours post sheath removal for possible cardiac intervention. Sheath removed at 0820. CBC WNL on 02/15/18. TR band already off, will avoid bolus.  6/11 AM update: plan for CABG later this week, initial heparin level is low, no issues per RN.   Goal of Therapy:  Heparin level 0.3-0.7 units/ml Monitor platelets by anticoagulation protocol: Yes   Plan:  Inc heparin to 1100 units/hr 1000 HL CABG later this week  Abran DukeLedford, Nemesis Rainwater 02/19/2018,1:41 AM

## 2018-02-19 NOTE — Progress Notes (Signed)
Consulted IV team to restart a new IV. Assess patient and found no potential access site.

## 2018-02-19 NOTE — Progress Notes (Addendum)
The patient has been seen in conjunction with Laverda PageLindsay Roberts, NP. All aspects of care have been considered and discussed. The patient has been personally interviewed, examined, and all clinical data has been reviewed.   Digital images personally reviewed.  High-grade diffuse three-vessel disease noted.  Agree with treatment strategy including multivessel CABG.  BP control needs to be better.  Risk reduction with statins, ACE inhibitor/ARB therapy, and diabetes therapy to achieve LDL less than 70, BP 130/80 mmHg, and A1c less than 7.  Plan surgery later this week.  Continue IV heparin until surgery.  Progress Note  [name] Date of Encounter: 02/19/2018  Primary Cardiologist: Nona DellSamuel McDowell, MD  Subjective   Feeling well this morning. No chest pain. Awaiting surgery.   Inpatient Medications    Scheduled Meds: . aspirin EC  81 mg Oral Daily  . atenolol  25 mg Oral QPM  . atenolol  50 mg Oral q morning - 10a  . atorvastatin  80 mg Oral q1800  . cholecalciferol  1,000 Units Oral Daily  . glipiZIDE  10 mg Oral QHS  . hydrochlorothiazide  25 mg Oral Daily  . insulin aspart  0-15 Units Subcutaneous TID WC  . losartan  100 mg Oral QHS  . multivitamin with minerals  1 tablet Oral Daily  . omega-3 acid ethyl esters   Oral QODAY  . potassium chloride  10 mEq Oral Daily  . sodium chloride flush  3 mL Intravenous Q12H  . vitamin B-12  1,000 mcg Oral Once per day on Mon Thu   Continuous Infusions: . sodium chloride    . heparin 1,100 Units/hr (02/19/18 0233)   PRN Meds: sodium chloride, acetaminophen, morphine injection, ondansetron (ZOFRAN) IV, sodium chloride, sodium chloride flush   Vital Signs    Vitals:   02/18/18 1954 02/19/18 0002 02/19/18 0431 02/19/18 0854  BP: (!) 170/73 (!) 166/79 (!) 143/72 (!) 149/65  Pulse: 60 (!) 59 66 70  Resp: 18 18 20 18   Temp: 98 F (36.7 C) 98 F (36.7 C) 97.8 F (36.6 C)   TempSrc: Oral Oral Oral     SpO2: 96% 97% 99%   Weight:   207 lb 9.6 oz (94.2 kg)   Height:        Intake/Output Summary (Last 24 hours) at 02/19/2018 0931 Last data filed at 02/19/2018 0900 Gross per 24 hour  Intake 1322.83 ml  Output 1750 ml  Net -427.17 ml   Filed Weights   02/18/18 0538 02/18/18 1349 02/19/18 0431  Weight: 207 lb (93.9 kg) 207 lb 6.4 oz (94.1 kg) 207 lb 9.6 oz (94.2 kg)    Telemetry    SR - Personally Reviewed  Physical Exam   General: Well developed, well nourished, obese W female appearing in no acute distress. Head: Normocephalic, atraumatic.  Neck: Supple without bruits, JVD. Lungs:  Resp regular and unlabored, CTA. Heart: RRR, S1, S2, no S3, S4, or murmur; no rub. Abdomen: Soft, non-tender, non-distended with normoactive bowel sounds. Extremities: No clubbing, cyanosis, edema. Distal pedal pulses are 2+ bilaterally. Right radial cath site stable.  Neuro: Alert and oriented X 3. Moves all extremities spontaneously. Psych: Normal affect.  Labs    Chemistry Recent Labs  Lab 02/15/18 1420  NA 141  K 3.6  CL 108  CO2 23  GLUCOSE 90  BUN 20  CREATININE 0.81  CALCIUM 9.5  GFRNONAA >60  GFRAA >60  ANIONGAP 10     Hematology Recent Labs  Lab 02/15/18 1420 02/19/18 0107 02/19/18 0622  WBC 6.7 6.7 6.0  RBC 4.35 3.76* 3.96  HGB 12.4 11.0* 11.5*  HCT 37.8 32.1* 34.0*  MCV 86.9 85.4 85.9  MCH 28.5 29.3 29.0  MCHC 32.8 34.3 33.8  RDW 13.8 13.7 13.6  PLT 222 187 197    Cardiac EnzymesNo results for input(s): TROPONINI in the last 168 hours. No results for input(s): TROPIPOC in the last 168 hours.   BNPNo results for input(s): BNP, PROBNP in the last 168 hours.   DDimer No results for input(s): DDIMER in the last 168 hours.    Radiology    No results found.  Cardiac Studies   Cath: 02/18/18  Conclusion     Ost LAD to Prox LAD lesion is 95% stenosed. Prox LAD lesion is 80% stenosed.  Ost Ramus to Ramus lesion is 90% stenosed. Ramus lesion is 45%  stenosed. Lat Ramus lesion is 100% stenosed.  Prox Cx lesion is 85% stenosed. Mid Cx lesion is 70% stenosed.  1st RPLB lesion is 95% stenosed.  The left ventricular systolic function is normal. The left ventricular ejection fraction is 55-65% by visual estimate.  LV end diastolic pressure is mildly elevated.    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 Multivessel PCI. Convert to High Dose Statin Continue Beta Blocker Convert from Metformin to SSI  Bryan Lemma, M.D., M.S.   Patient Profile     62 y.o. female with PMH of HTN, HL, DM, and obesity who presented with chest tightness to the office. Had an abnormal stress test and referred for outpatient cath. Underwent cath yesterday with Dr. Herbie Baltimore noted above with 3v disease and TCTS consulted.   Assessment & Plan    1. CAD: Underwent cath yesterday noted above with 3v disease noted. Normal EF on recent echo. Seen by TCTS with plans for CABG this week depending on OR availability. No chest pain. Remains on IV heparin. Undergoing preop testing. Continue on ASA, statin, BB, ARB  2. HTN: continue on current therapy  3. HL: on statin, added post cath.  -- check lipids in the am  4. DM: metformin on hold. SSI while inpatient.  -- check Hgb A1c  Signed, Laverda Page, NP  02/19/2018, 9:31 AM  Pager # [phone]   For questions or updates, please contact [facility] Please consult [email or web address] for contact info under Cardiology/STEMI.

## 2018-02-19 NOTE — Progress Notes (Signed)
ANTICOAGULATION CONSULT NOTE    Pharmacy Consult for heparin Indication: chest pain/ACS  No Known Allergies  Patient Measurements: Height: 5\' 4"  (162.6 cm) Weight: 207 lb 9.6 oz (94.2 kg)(scale b) IBW/kg (Calculated) : 54.7 Heparin Dosing Weight: 76 Kg  Vital Signs: Temp: 98.3 F (36.8 C) (06/11 2023) Temp Source: Oral (06/11 2023) BP: 155/65 (06/11 2023) Pulse Rate: 62 (06/11 2023)  Labs: Recent Labs    02/19/18 0107 02/19/18 0622 02/19/18 1016 02/19/18 1855  HGB 11.0* 11.5*  --   --   HCT 32.1* 34.0*  --   --   PLT 187 197  --   --   HEPARINUNFRC 0.10*  --  0.12* 0.30   Estimated Creatinine Clearance: 82.2 mL/min (by C-G formula based on SCr of 0.81 mg/dL).  Medical History: Past Medical History:  Diagnosis Date  . Hyperlipidemia   . Hypertension   . Low back pain 08/06/2013  . Obesity   . Type 2 diabetes mellitus (HCC)   . Vaginal Pap smear, abnormal    Medications:  Medications Prior to Admission  Medication Sig Dispense Refill Last Dose  . acetaminophen (TYLENOL) 500 MG tablet Take 1,000 mg by mouth every 8 (eight) hours as needed for moderate pain.   prn  . aspirin (ST JOSEPH ASPIRIN) 81 MG EC tablet Take 81 mg by mouth daily.    02/18/2018 at 0300  . atenolol (TENORMIN) 50 MG tablet Take 25-50 mg by mouth See admin instructions. Take 50mg  in the morning and 25mg  at night   02/18/2018 at 0300  . Chlorpheniramine-PSE-Ibuprofen (ADVIL ALLERGY SINUS PO) Take 1 tablet by mouth daily as needed (sinus).   prn  . Cholecalciferol (VITAMIN D-3) 1000 units CAPS Take 1,000 Units by mouth daily.    02/18/2018 at 0300  . Cyanocobalamin (VITAMIN B 12 PO) Take 1,000 mcg by mouth 2 (two) times a week. Saturday and Sunday   02/17/2018  . GLIPIZIDE XL 10 MG 24 hr tablet Take 10 mg by mouth at bedtime.  3 02/17/2018 at Unknown time  . hydrochlorothiazide (HYDRODIURIL) 25 MG tablet Take 25 mg by mouth daily.   02/17/2018  . losartan (COZAAR) 100 MG tablet Take 100 mg by mouth at  bedtime.    02/18/2018 at 0300  . lovastatin (MEVACOR) 20 MG tablet Take 20 mg by mouth daily.   02/17/2018  . metFORMIN (GLUCOPHAGE) 500 MG tablet Take 750 mg by mouth 2 (two) times daily.    02/17/2018 at Unknown time  . Multiple Vitamin (MULTIVITAMIN) tablet Take 1 tablet by mouth daily.   02/17/2018  . Omega-3 Fatty Acids (OMEGA 3 PO) Take 520 mg by mouth every other day.    02/17/2018  . potassium chloride (K-DUR) 10 MEQ tablet Take 10 mEq by mouth daily.   02/18/2018 at 0300  . sodium chloride (OCEAN) 0.65 % SOLN nasal spray Place 1 spray into both nostrils daily as needed for congestion.   prn    Assessment: [age] yoF s/p cardiac cath on 6/10 awaiting CABG on 6/12. HgB remains low but stable, no overt bleeding reported  Heparin level 0.3 at goal heparin drip rate 1350 uts/hr.    Goal of Therapy:  Heparin level 0.3-0.7 units/ml Monitor platelets by anticoagulation protocol: Yes   Plan:  Continue  heparin infusion  1350 units/hr - off on call to OR in am  Leota SauersLisa Ahnika Hannibal Pharm.D. CPP, BCPS Clinical Pharmacist [phone] 02/19/2018 8:29 PM

## 2018-02-20 ENCOUNTER — Inpatient Hospital Stay (HOSPITAL_COMMUNITY): Payer: BC Managed Care – PPO

## 2018-02-20 ENCOUNTER — Inpatient Hospital Stay (HOSPITAL_COMMUNITY): Payer: BC Managed Care – PPO | Admitting: Anesthesiology

## 2018-02-20 ENCOUNTER — Encounter (HOSPITAL_COMMUNITY): Payer: Self-pay | Admitting: Anesthesiology

## 2018-02-20 ENCOUNTER — Inpatient Hospital Stay (HOSPITAL_COMMUNITY)
Admission: AD | Disposition: A | Discharge status: Home / Self Care | Payer: Self-pay | Source: Ambulatory Visit | Attending: Thoracic Surgery (Cardiothoracic Vascular Surgery)

## 2018-02-20 DIAGNOSIS — Z951 Presence of aortocoronary bypass graft: Secondary | ICD-10-CM

## 2018-02-20 DIAGNOSIS — I2510 Atherosclerotic heart disease of native coronary artery without angina pectoris: Secondary | ICD-10-CM | POA: Diagnosis present

## 2018-02-20 HISTORY — PX: CORONARY ARTERY BYPASS GRAFT: SHX141

## 2018-02-20 HISTORY — PX: TEE WITHOUT CARDIOVERSION: SHX5443

## 2018-02-20 LAB — POCT I-STAT 3, ART BLOOD GAS (G3+)
ACID-BASE DEFICIT: 4 mmol/L — AB (ref 0.0–2.0)
ACID-BASE DEFICIT: 1 mmol/L (ref 0.0–2.0)
Acid-base deficit: 4 mmol/L — ABNORMAL HIGH (ref 0.0–2.0)
Acid-base deficit: 1 mmol/L (ref 0.0–2.0)
Acid-base deficit: 1 mmol/L (ref 0.0–2.0)
Acid-base deficit: 2 mmol/L (ref 0.0–2.0)
BICARBONATE: 22.1 mmol/L (ref 20.0–28.0)
BICARBONATE: 22.7 mmol/L (ref 20.0–28.0)
BICARBONATE: 23.1 mmol/L (ref 20.0–28.0)
Bicarbonate: 23 mmol/L (ref 20.0–28.0)
Bicarbonate: 23.2 mmol/L (ref 20.0–28.0)
Bicarbonate: 23.1 mmol/L (ref 20.0–28.0)
Bicarbonate: 23.7 mmol/L (ref 20.0–28.0)
O2 SAT: 99 %
O2 SAT: 95 %
O2 SAT: 97 %
O2 SAT: 97 %
O2 Saturation: 100 %
O2 Saturation: 70 %
O2 Saturation: 96 %
PCO2 ART: 41.4 mmHg (ref 32.0–48.0)
PCO2 ART: 35.3 mmHg (ref 32.0–48.0)
PCO2 ART: 35.1 mmHg (ref 32.0–48.0)
PH ART: 7.335 — AB (ref 7.350–7.450)
PH ART: 7.425 (ref 7.350–7.450)
PH ART: 7.286 — AB (ref 7.350–7.450)
PO2 ART: 78 mmHg — AB (ref 83.0–108.0)
PO2 ART: 91 mmHg (ref 83.0–108.0)
PO2 ART: 100 mmHg (ref 83.0–108.0)
Patient temperature: 36.7
TCO2: 23 mmol/L (ref 22–32)
TCO2: 24 mmol/L (ref 22–32)
TCO2: 24 mmol/L (ref 22–32)
TCO2: 24 mmol/L (ref 22–32)
TCO2: 24 mmol/L (ref 22–32)
TCO2: 24 mmol/L (ref 22–32)
TCO2: 25 mmol/L (ref 22–32)
pCO2 arterial: 30.9 mmHg — ABNORMAL LOW (ref 32.0–48.0)
pCO2 arterial: 46.9 mmHg (ref 32.0–48.0)
pCO2 arterial: 36.4 mmHg (ref 32.0–48.0)
pCO2 arterial: 43.9 mmHg (ref 32.0–48.0)
pH, Arterial: 7.425 (ref 7.350–7.450)
pH, Arterial: 7.48 — ABNORMAL HIGH (ref 7.350–7.450)
pH, Arterial: 7.41 (ref 7.350–7.450)
pH, Arterial: 7.34 — ABNORMAL LOW (ref 7.350–7.450)
pO2, Arterial: 170 mmHg — ABNORMAL HIGH (ref 83.0–108.0)
pO2, Arterial: 322 mmHg — ABNORMAL HIGH (ref 83.0–108.0)
pO2, Arterial: 35 mmHg — CL (ref 83.0–108.0)
pO2, Arterial: 79 mmHg — ABNORMAL LOW (ref 83.0–108.0)

## 2018-02-20 LAB — GLUCOSE, CAPILLARY
GLUCOSE-CAPILLARY: 137 mg/dL — AB (ref 65–99)
GLUCOSE-CAPILLARY: 142 mg/dL — AB (ref 65–99)
GLUCOSE-CAPILLARY: 153 mg/dL — AB (ref 65–99)
GLUCOSE-CAPILLARY: 133 mg/dL — AB (ref 65–99)
GLUCOSE-CAPILLARY: 116 mg/dL — AB (ref 65–99)
Glucose-Capillary: 160 mg/dL — ABNORMAL HIGH (ref 65–99)
Glucose-Capillary: 131 mg/dL — ABNORMAL HIGH (ref 65–99)
Glucose-Capillary: 149 mg/dL — ABNORMAL HIGH (ref 65–99)
Glucose-Capillary: 144 mg/dL — ABNORMAL HIGH (ref 65–99)

## 2018-02-20 LAB — CBC
HCT: 34 % — ABNORMAL LOW (ref 36.0–46.0)
HCT: 28.5 % — ABNORMAL LOW (ref 36.0–46.0)
HEMATOCRIT: 28.1 % — AB (ref 36.0–46.0)
HEMOGLOBIN: 11.5 g/dL — AB (ref 12.0–15.0)
HEMOGLOBIN: 9.4 g/dL — AB (ref 12.0–15.0)
HEMOGLOBIN: 9.6 g/dL — AB (ref 12.0–15.0)
MCH: 29 pg (ref 26.0–34.0)
MCH: 29 pg (ref 26.0–34.0)
MCH: 29.2 pg (ref 26.0–34.0)
MCHC: 33.8 g/dL (ref 30.0–36.0)
MCHC: 33.5 g/dL (ref 30.0–36.0)
MCHC: 33.7 g/dL (ref 30.0–36.0)
MCV: 85.6 fL (ref 78.0–100.0)
MCV: 86.7 fL (ref 78.0–100.0)
MCV: 86.6 fL (ref 78.0–100.0)
PLATELETS: 141 10*3/uL — AB (ref 150–400)
Platelets: 187 10*3/uL (ref 150–400)
Platelets: 128 10*3/uL — ABNORMAL LOW (ref 150–400)
RBC: 3.97 MIL/uL (ref 3.87–5.11)
RBC: 3.24 MIL/uL — ABNORMAL LOW (ref 3.87–5.11)
RBC: 3.29 MIL/uL — ABNORMAL LOW (ref 3.87–5.11)
RDW: 13.7 % (ref 11.5–15.5)
RDW: 13.9 % (ref 11.5–15.5)
RDW: 14 % (ref 11.5–15.5)
WBC: 6.3 10*3/uL (ref 4.0–10.5)
WBC: 7.4 10*3/uL (ref 4.0–10.5)
WBC: 8.8 10*3/uL (ref 4.0–10.5)

## 2018-02-20 LAB — POCT I-STAT, CHEM 8
BUN: 10 mg/dL (ref 6–20)
BUN: 9 mg/dL (ref 6–20)
BUN: 7 mg/dL (ref 6–20)
BUN: 8 mg/dL (ref 6–20)
BUN: 9 mg/dL (ref 6–20)
BUN: 8 mg/dL (ref 6–20)
CALCIUM ION: 1.25 mmol/L (ref 1.15–1.40)
CALCIUM ION: 1.23 mmol/L (ref 1.15–1.40)
CALCIUM ION: 1.19 mmol/L (ref 1.15–1.40)
CHLORIDE: 102 mmol/L (ref 101–111)
CHLORIDE: 98 mmol/L — AB (ref 101–111)
CHLORIDE: 101 mmol/L (ref 101–111)
CHLORIDE: 100 mmol/L — AB (ref 101–111)
CHLORIDE: 105 mmol/L (ref 101–111)
CREATININE: 0.6 mg/dL (ref 0.44–1.00)
CREATININE: 0.4 mg/dL — AB (ref 0.44–1.00)
Calcium, Ion: 0.91 mmol/L — ABNORMAL LOW (ref 1.15–1.40)
Calcium, Ion: 1.1 mmol/L — ABNORMAL LOW (ref 1.15–1.40)
Calcium, Ion: 1.15 mmol/L (ref 1.15–1.40)
Chloride: 102 mmol/L (ref 101–111)
Creatinine, Ser: 0.7 mg/dL (ref 0.44–1.00)
Creatinine, Ser: 0.6 mg/dL (ref 0.44–1.00)
Creatinine, Ser: 0.6 mg/dL (ref 0.44–1.00)
Creatinine, Ser: 0.7 mg/dL (ref 0.44–1.00)
GLUCOSE: 115 mg/dL — AB (ref 65–99)
GLUCOSE: 181 mg/dL — AB (ref 65–99)
Glucose, Bld: 149 mg/dL — ABNORMAL HIGH (ref 65–99)
Glucose, Bld: 133 mg/dL — ABNORMAL HIGH (ref 65–99)
Glucose, Bld: 186 mg/dL — ABNORMAL HIGH (ref 65–99)
Glucose, Bld: 133 mg/dL — ABNORMAL HIGH (ref 65–99)
HCT: 25 % — ABNORMAL LOW (ref 36.0–46.0)
HCT: 20 % — ABNORMAL LOW (ref 36.0–46.0)
HEMATOCRIT: 29 % — AB (ref 36.0–46.0)
HEMATOCRIT: 23 % — AB (ref 36.0–46.0)
HEMATOCRIT: 23 % — AB (ref 36.0–46.0)
HEMATOCRIT: 23 % — AB (ref 36.0–46.0)
HEMOGLOBIN: 9.9 g/dL — AB (ref 12.0–15.0)
HEMOGLOBIN: 7.8 g/dL — AB (ref 12.0–15.0)
Hemoglobin: 8.5 g/dL — ABNORMAL LOW (ref 12.0–15.0)
Hemoglobin: 6.8 g/dL — CL (ref 12.0–15.0)
Hemoglobin: 7.8 g/dL — ABNORMAL LOW (ref 12.0–15.0)
Hemoglobin: 7.8 g/dL — ABNORMAL LOW (ref 12.0–15.0)
POTASSIUM: 3.5 mmol/L (ref 3.5–5.1)
POTASSIUM: 5.3 mmol/L — AB (ref 3.5–5.1)
Potassium: 3.4 mmol/L — ABNORMAL LOW (ref 3.5–5.1)
Potassium: 3.6 mmol/L (ref 3.5–5.1)
Potassium: 4.3 mmol/L (ref 3.5–5.1)
Potassium: 4.2 mmol/L (ref 3.5–5.1)
SODIUM: 135 mmol/L (ref 135–145)
SODIUM: 137 mmol/L (ref 135–145)
SODIUM: 141 mmol/L (ref 135–145)
Sodium: 140 mmol/L (ref 135–145)
Sodium: 139 mmol/L (ref 135–145)
Sodium: 140 mmol/L (ref 135–145)
TCO2: 24 mmol/L (ref 22–32)
TCO2: 23 mmol/L (ref 22–32)
TCO2: 26 mmol/L (ref 22–32)
TCO2: 26 mmol/L (ref 22–32)
TCO2: 22 mmol/L (ref 22–32)
TCO2: 23 mmol/L (ref 22–32)

## 2018-02-20 LAB — BLOOD GAS, ARTERIAL
ACID-BASE EXCESS: 0.9 mmol/L (ref 0.0–2.0)
BICARBONATE: 24.8 mmol/L (ref 20.0–28.0)
Drawn by: 270271
FIO2: 21
O2 SAT: 95.7 %
PATIENT TEMPERATURE: 98.6
PO2 ART: 79.3 mmHg — AB (ref 83.0–108.0)
pCO2 arterial: 38.7 mmHg (ref 32.0–48.0)
pH, Arterial: 7.423 (ref 7.350–7.450)

## 2018-02-20 LAB — URINALYSIS, ROUTINE W REFLEX MICROSCOPIC
BILIRUBIN URINE: NEGATIVE
Bacteria, UA: NONE SEEN
Glucose, UA: NEGATIVE mg/dL
HGB URINE DIPSTICK: NEGATIVE
KETONES UR: NEGATIVE mg/dL
NITRITE: NEGATIVE
PROTEIN: NEGATIVE mg/dL
Specific Gravity, Urine: 1.004 — ABNORMAL LOW (ref 1.005–1.030)
pH: 6 (ref 5.0–8.0)

## 2018-02-20 LAB — PROTIME-INR
INR: 1.38
PROTHROMBIN TIME: 16.9 s — AB (ref 11.4–15.2)

## 2018-02-20 LAB — SURGICAL PCR SCREEN
MRSA, PCR: NEGATIVE
Staphylococcus aureus: NEGATIVE

## 2018-02-20 LAB — LIPID PANEL
CHOL/HDL RATIO: 2.9 ratio
Cholesterol: 132 mg/dL (ref 0–200)
HDL: 46 mg/dL (ref >40)
LDL CALC: 57 mg/dL (ref 0–99)
Triglycerides: 143 mg/dL (ref <150)
VLDL: 29 mg/dL (ref 0–40)

## 2018-02-20 LAB — APTT: APTT: 26 s (ref 24–36)

## 2018-02-20 LAB — HEMOGLOBIN AND HEMATOCRIT, BLOOD
HCT: 23.2 % — ABNORMAL LOW (ref 36.0–46.0)
Hemoglobin: 7.9 g/dL — ABNORMAL LOW (ref 12.0–15.0)

## 2018-02-20 LAB — PLATELET COUNT: Platelets: 158 K/uL (ref 150–400)

## 2018-02-20 LAB — HEPARIN LEVEL (UNFRACTIONATED): Heparin Unfractionated: 0.35 IU/mL (ref 0.30–0.70)

## 2018-02-20 LAB — POCT I-STAT 4, (NA,K, GLUC, HGB,HCT)
GLUCOSE: 143 mg/dL — AB (ref 65–99)
HCT: 23 % — ABNORMAL LOW (ref 36.0–46.0)
Hemoglobin: 7.8 g/dL — ABNORMAL LOW (ref 12.0–15.0)
Potassium: 3.8 mmol/L (ref 3.5–5.1)
SODIUM: 141 mmol/L (ref 135–145)

## 2018-02-20 LAB — MAGNESIUM: Magnesium: 2.3 mg/dL (ref 1.7–2.4)

## 2018-02-20 LAB — CREATININE, SERUM: CREATININE: 0.82 mg/dL (ref 0.44–1.00)

## 2018-02-20 LAB — PREPARE RBC (CROSSMATCH)

## 2018-02-20 SURGERY — CORONARY ARTERY BYPASS GRAFTING (CABG)
Anesthesia: General | Site: Chest

## 2018-02-20 MED ORDER — SODIUM CHLORIDE 0.9 % IV SOLN
0.0000 ug/min | INTRAVENOUS | Status: DC
  Filled 2018-02-20: qty 2

## 2018-02-20 MED ORDER — PROPOFOL 10 MG/ML IV BOLUS
INTRAVENOUS | Status: AC
  Filled 2018-02-20: qty 20

## 2018-02-20 MED ORDER — SODIUM CHLORIDE 0.9 % IV SOLN
INTRAVENOUS | Status: DC
  Administered 2018-02-21: 3.4 [IU]/h via INTRAVENOUS
  Filled 2018-02-20: qty 1

## 2018-02-20 MED ORDER — FENTANYL CITRATE (PF) 250 MCG/5ML IJ SOLN
INTRAMUSCULAR | Status: DC | PRN
  Administered 2018-02-20: 200 ug via INTRAVENOUS
  Administered 2018-02-20: 100 ug via INTRAVENOUS
  Administered 2018-02-20: 50 ug via INTRAVENOUS
  Administered 2018-02-20: 100 ug via INTRAVENOUS
  Administered 2018-02-20: 150 ug via INTRAVENOUS
  Administered 2018-02-20 (×2): 250 ug via INTRAVENOUS
  Administered 2018-02-20: 100 ug via INTRAVENOUS
  Administered 2018-02-20: 50 ug via INTRAVENOUS
  Administered 2018-02-20: 250 ug via INTRAVENOUS

## 2018-02-20 MED ORDER — ONDANSETRON HCL 4 MG/2ML IJ SOLN
4.0000 mg | Freq: Four times a day (QID) | INTRAMUSCULAR | Status: DC | PRN
  Administered 2018-02-21: 4 mg via INTRAVENOUS
  Filled 2018-02-20: qty 2

## 2018-02-20 MED ORDER — BISACODYL 10 MG RE SUPP: 10.0000 mg | Freq: Every day | RECTAL | Status: DC

## 2018-02-20 MED ORDER — INSULIN REGULAR BOLUS VIA INFUSION
0.0000 [IU] | Freq: Three times a day (TID) | INTRAVENOUS | Status: DC
  Administered 2018-02-21: 4 [IU] via INTRAVENOUS
  Filled 2018-02-20: qty 10

## 2018-02-20 MED ORDER — CHLORHEXIDINE GLUCONATE 0.12 % MT SOLN
15.0000 mL | OROMUCOSAL | Status: AC
  Administered 2018-02-20: 15 mL via OROMUCOSAL

## 2018-02-20 MED ORDER — CHLORHEXIDINE GLUCONATE 0.12% ORAL RINSE (MEDLINE KIT)
15.0000 mL | Freq: Two times a day (BID) | OROMUCOSAL | Status: DC
  Administered 2018-02-20 – 2018-02-21 (×3): 15 mL via OROMUCOSAL

## 2018-02-20 MED ORDER — DEXMEDETOMIDINE HCL IN NACL 400 MCG/100ML IV SOLN
0.0000 ug/kg/h | INTRAVENOUS | Status: DC
  Administered 2018-02-20: 0.7 ug/kg/h via INTRAVENOUS
  Filled 2018-02-20: qty 100

## 2018-02-20 MED ORDER — DOCUSATE SODIUM 100 MG PO CAPS
200.0000 mg | ORAL_CAPSULE | Freq: Every day | ORAL | Status: DC
  Administered 2018-02-21 – 2018-02-24 (×4): 200 mg via ORAL
  Filled 2018-02-20 (×5): qty 2

## 2018-02-20 MED ORDER — MAGNESIUM SULFATE 4 GM/100ML IV SOLN
4.0000 g | Freq: Once | INTRAVENOUS | Status: AC
  Administered 2018-02-20: 4 g via INTRAVENOUS
  Filled 2018-02-20: qty 100

## 2018-02-20 MED ORDER — LACTATED RINGERS IV SOLN
500.0000 mL | Freq: Once | INTRAVENOUS | Status: DC | PRN
Start: 2018-02-20 — End: 2018-02-22

## 2018-02-20 MED ORDER — BISACODYL 5 MG PO TBEC
10.0000 mg | DELAYED_RELEASE_TABLET | Freq: Every day | ORAL | Status: DC
  Administered 2018-02-21 – 2018-02-23 (×3): 10 mg via ORAL
  Filled 2018-02-20 (×5): qty 2

## 2018-02-20 MED ORDER — SODIUM CHLORIDE 0.45 % IV SOLN
INTRAVENOUS | Status: DC | PRN
  Administered 2018-02-20: 14:00:00 via INTRAVENOUS

## 2018-02-20 MED ORDER — METOPROLOL TARTRATE 12.5 MG HALF TABLET
12.5000 mg | ORAL_TABLET | Freq: Two times a day (BID) | ORAL | Status: DC
  Administered 2018-02-20 – 2018-02-21 (×3): 12.5 mg via ORAL
  Filled 2018-02-20 (×3): qty 1

## 2018-02-20 MED ORDER — TRAMADOL HCL 50 MG PO TABS
50.0000 mg | ORAL_TABLET | ORAL | Status: DC | PRN
  Administered 2018-02-21 – 2018-02-22 (×3): 100 mg via ORAL
  Filled 2018-02-20 (×3): qty 2

## 2018-02-20 MED ORDER — SODIUM CHLORIDE 0.9 % IV SOLN: Freq: Once | INTRAVENOUS | Status: DC

## 2018-02-20 MED ORDER — HEPARIN SODIUM (PORCINE) 1000 UNIT/ML IJ SOLN
INTRAMUSCULAR | Status: AC
  Filled 2018-02-20: qty 1

## 2018-02-20 MED ORDER — ORAL CARE MOUTH RINSE
15.0000 mL | OROMUCOSAL | Status: DC
  Administered 2018-02-20 (×2): 15 mL via OROMUCOSAL

## 2018-02-20 MED ORDER — VANCOMYCIN HCL IN DEXTROSE 1-5 GM/200ML-% IV SOLN
1000.0000 mg | Freq: Once | INTRAVENOUS | Status: AC
  Administered 2018-02-20: 1000 mg via INTRAVENOUS
  Filled 2018-02-20: qty 200

## 2018-02-20 MED ORDER — FAMOTIDINE IN NACL 20-0.9 MG/50ML-% IV SOLN
20.0000 mg | Freq: Two times a day (BID) | INTRAVENOUS | Status: AC
  Administered 2018-02-20 (×2): 20 mg via INTRAVENOUS
  Filled 2018-02-20: qty 50

## 2018-02-20 MED ORDER — ALBUMIN HUMAN 5 % IV SOLN
250.0000 mL | INTRAVENOUS | Status: AC | PRN
  Administered 2018-02-20 (×2): 250 mL via INTRAVENOUS

## 2018-02-20 MED ORDER — SODIUM CHLORIDE 0.9 % IV SOLN
INTRAVENOUS | Status: DC
  Administered 2018-02-20: 14:00:00 via INTRAVENOUS

## 2018-02-20 MED ORDER — PROTAMINE SULFATE 10 MG/ML IV SOLN
INTRAVENOUS | Status: AC
  Filled 2018-02-20: qty 25

## 2018-02-20 MED ORDER — SODIUM CHLORIDE 0.9 % IV SOLN: 250.0000 mL | INTRAVENOUS | Status: DC

## 2018-02-20 MED ORDER — METOPROLOL TARTRATE 25 MG/10 ML ORAL SUSPENSION: 12.5000 mg | Freq: Two times a day (BID) | ORAL | Status: DC

## 2018-02-20 MED ORDER — SODIUM BICARBONATE 8.4 % IV SOLN
50.0000 meq | Freq: Once | INTRAVENOUS | Status: AC
  Administered 2018-02-20: 50 meq via INTRAVENOUS

## 2018-02-20 MED ORDER — ACETAMINOPHEN 160 MG/5ML PO SOLN: 1000.0000 mg | Freq: Four times a day (QID) | ORAL | Status: DC

## 2018-02-20 MED ORDER — ROCURONIUM BROMIDE 10 MG/ML (PF) SYRINGE
PREFILLED_SYRINGE | INTRAVENOUS | Status: DC | PRN
  Administered 2018-02-20: 30 mg via INTRAVENOUS
  Administered 2018-02-20: 50 mg via INTRAVENOUS
  Administered 2018-02-20: 40 mg via INTRAVENOUS
  Administered 2018-02-20: 20 mg via INTRAVENOUS
  Administered 2018-02-20: 60 mg via INTRAVENOUS

## 2018-02-20 MED ORDER — LACTATED RINGERS IV SOLN
INTRAVENOUS | Status: DC | PRN
  Administered 2018-02-20 (×3): via INTRAVENOUS

## 2018-02-20 MED ORDER — SODIUM CHLORIDE 0.9% FLUSH: 3.0000 mL | INTRAVENOUS | Status: DC | PRN

## 2018-02-20 MED ORDER — ORAL CARE MOUTH RINSE
15.0000 mL | Freq: Two times a day (BID) | OROMUCOSAL | Status: DC
  Administered 2018-02-20 – 2018-02-21 (×2): 15 mL via OROMUCOSAL

## 2018-02-20 MED ORDER — PROTAMINE SULFATE 10 MG/ML IV SOLN
INTRAVENOUS | Status: DC | PRN
  Administered 2018-02-20 (×2): 20 mg via INTRAVENOUS
  Administered 2018-02-20: 50 mg via INTRAVENOUS
  Administered 2018-02-20: 40 mg via INTRAVENOUS
  Administered 2018-02-20 (×2): 30 mg via INTRAVENOUS
  Administered 2018-02-20: 50 mg via INTRAVENOUS
  Administered 2018-02-20 (×3): 20 mg via INTRAVENOUS

## 2018-02-20 MED ORDER — ROCURONIUM BROMIDE 10 MG/ML (PF) SYRINGE
PREFILLED_SYRINGE | INTRAVENOUS | Status: AC
  Filled 2018-02-20: qty 10

## 2018-02-20 MED ORDER — METOPROLOL TARTRATE 5 MG/5ML IV SOLN: 2.5000 mg | INTRAVENOUS | Status: DC | PRN

## 2018-02-20 MED ORDER — 0.9 % SODIUM CHLORIDE (POUR BTL) OPTIME
TOPICAL | Status: DC | PRN
  Administered 2018-02-20 (×5): 1000 mL

## 2018-02-20 MED ORDER — HEMOSTATIC AGENTS (NO CHARGE) OPTIME
TOPICAL | Status: DC | PRN
  Administered 2018-02-20 (×3): 1 via TOPICAL

## 2018-02-20 MED ORDER — ONDANSETRON HCL 4 MG/2ML IJ SOLN
INTRAMUSCULAR | Status: DC | PRN
  Administered 2018-02-20: 4 mg via INTRAVENOUS

## 2018-02-20 MED ORDER — OMEGA-3-ACID ETHYL ESTERS 1 G PO CAPS: 1.0000 g | ORAL_CAPSULE | ORAL | Status: DC

## 2018-02-20 MED ORDER — MORPHINE SULFATE (PF) 2 MG/ML IV SOLN
1.0000 mg | INTRAVENOUS | Status: AC | PRN
  Administered 2018-02-20: 4 mg via INTRAVENOUS
  Filled 2018-02-20 (×2): qty 2

## 2018-02-20 MED ORDER — HEPARIN SODIUM (PORCINE) 1000 UNIT/ML IJ SOLN
INTRAMUSCULAR | Status: DC | PRN
  Administered 2018-02-20: 31000 [IU] via INTRAVENOUS
  Administered 2018-02-20: 2000 [IU] via INTRAVENOUS

## 2018-02-20 MED ORDER — MIDAZOLAM HCL 10 MG/2ML IJ SOLN
INTRAMUSCULAR | Status: AC
Start: 2018-02-20 — End: ?
  Filled 2018-02-20: qty 2

## 2018-02-20 MED ORDER — OMEGA-3-ACID ETHYL ESTERS 1 G PO CAPS
1.0000 g | ORAL_CAPSULE | ORAL | Status: DC
  Administered 2018-02-21 – 2018-02-25 (×3): 1 g via ORAL
  Filled 2018-02-20 (×4): qty 1

## 2018-02-20 MED ORDER — ACETAMINOPHEN 500 MG PO TABS
1000.0000 mg | ORAL_TABLET | Freq: Four times a day (QID) | ORAL | Status: DC
  Administered 2018-02-20 – 2018-02-25 (×18): 1000 mg via ORAL
  Filled 2018-02-20 (×17): qty 2

## 2018-02-20 MED ORDER — ASPIRIN 81 MG PO CHEW: 324.0000 mg | CHEWABLE_TABLET | Freq: Every day | ORAL | Status: DC

## 2018-02-20 MED ORDER — SODIUM CHLORIDE 0.9 % IV SOLN
1.5000 g | Freq: Two times a day (BID) | INTRAVENOUS | Status: AC
  Administered 2018-02-20 – 2018-02-22 (×4): 1.5 g via INTRAVENOUS
  Filled 2018-02-20 (×4): qty 1.5

## 2018-02-20 MED ORDER — OXYCODONE HCL 5 MG PO TABS
5.0000 mg | ORAL_TABLET | ORAL | Status: DC | PRN
  Administered 2018-02-20 – 2018-02-24 (×4): 10 mg via ORAL
  Filled 2018-02-20 (×4): qty 2

## 2018-02-20 MED ORDER — LACTATED RINGERS IV SOLN
INTRAVENOUS | Status: DC | PRN
  Administered 2018-02-20: 08:00:00 via INTRAVENOUS

## 2018-02-20 MED ORDER — POTASSIUM CHLORIDE 10 MEQ/50ML IV SOLN
10.0000 meq | INTRAVENOUS | Status: AC
  Administered 2018-02-20 (×3): 10 meq via INTRAVENOUS
  Filled 2018-02-20: qty 50

## 2018-02-20 MED ORDER — MIDAZOLAM HCL 2 MG/2ML IJ SOLN
INTRAMUSCULAR | Status: AC
  Filled 2018-02-20: qty 2

## 2018-02-20 MED ORDER — PROTAMINE SULFATE 10 MG/ML IV SOLN
INTRAVENOUS | Status: AC
  Filled 2018-02-20: qty 5

## 2018-02-20 MED ORDER — ALBUMIN HUMAN 5 % IV SOLN
INTRAVENOUS | Status: DC | PRN
  Administered 2018-02-20 (×2): via INTRAVENOUS

## 2018-02-20 MED ORDER — FENTANYL CITRATE (PF) 250 MCG/5ML IJ SOLN
INTRAMUSCULAR | Status: AC
  Filled 2018-02-20: qty 5

## 2018-02-20 MED ORDER — MORPHINE SULFATE (PF) 2 MG/ML IV SOLN
2.0000 mg | INTRAVENOUS | Status: DC | PRN
  Administered 2018-02-20: 2 mg via INTRAVENOUS
  Administered 2018-02-21 (×2): 4 mg via INTRAVENOUS
  Filled 2018-02-20: qty 2
  Filled 2018-02-20: qty 1

## 2018-02-20 MED ORDER — MIDAZOLAM HCL 5 MG/5ML IJ SOLN
INTRAMUSCULAR | Status: DC | PRN
  Administered 2018-02-20 (×6): 2 mg via INTRAVENOUS

## 2018-02-20 MED ORDER — POTASSIUM CHLORIDE 10 MEQ/50ML IV SOLN
10.0000 meq | Freq: Once | INTRAVENOUS | Status: DC
  Filled 2018-02-20: qty 50

## 2018-02-20 MED ORDER — ACETAMINOPHEN 650 MG RE SUPP
650.0000 mg | Freq: Once | RECTAL | Status: AC
  Administered 2018-02-20: 650 mg via RECTAL

## 2018-02-20 MED ORDER — ASPIRIN EC 325 MG PO TBEC
325.0000 mg | DELAYED_RELEASE_TABLET | Freq: Every day | ORAL | Status: DC
  Administered 2018-02-21 – 2018-02-25 (×5): 325 mg via ORAL
  Filled 2018-02-20 (×5): qty 1

## 2018-02-20 MED ORDER — CHLORHEXIDINE GLUCONATE CLOTH 2 % EX PADS
6.0000 | MEDICATED_PAD | Freq: Every day | CUTANEOUS | Status: DC
  Administered 2018-02-20 – 2018-02-22 (×3): 6 via TOPICAL

## 2018-02-20 MED ORDER — SODIUM CHLORIDE 0.9% FLUSH
3.0000 mL | Freq: Two times a day (BID) | INTRAVENOUS | Status: DC
  Administered 2018-02-21 – 2018-02-22 (×3): 3 mL via INTRAVENOUS

## 2018-02-20 MED ORDER — ACETAMINOPHEN 160 MG/5ML PO SOLN: 650.0000 mg | Freq: Once | ORAL | Status: AC

## 2018-02-20 MED ORDER — FENTANYL CITRATE (PF) 250 MCG/5ML IJ SOLN
INTRAMUSCULAR | Status: AC
  Filled 2018-02-20: qty 25

## 2018-02-20 MED ORDER — LACTATED RINGERS IV SOLN
INTRAVENOUS | Status: DC
  Administered 2018-02-21: 05:00:00 via INTRAVENOUS

## 2018-02-20 MED ORDER — ARTIFICIAL TEARS OPHTHALMIC OINT
TOPICAL_OINTMENT | OPHTHALMIC | Status: DC | PRN
  Administered 2018-02-20: 1 via OPHTHALMIC

## 2018-02-20 MED ORDER — NITROGLYCERIN IN D5W 200-5 MCG/ML-% IV SOLN: 0.0000 ug/min | INTRAVENOUS | Status: DC

## 2018-02-20 MED ORDER — PROPOFOL 10 MG/ML IV BOLUS
INTRAVENOUS | Status: DC | PRN
  Administered 2018-02-20: 100 mg via INTRAVENOUS
  Administered 2018-02-20: 50 mg via INTRAVENOUS

## 2018-02-20 MED ORDER — PANTOPRAZOLE SODIUM 40 MG PO TBEC
40.0000 mg | DELAYED_RELEASE_TABLET | Freq: Every day | ORAL | Status: DC
  Administered 2018-02-22 – 2018-02-25 (×4): 40 mg via ORAL
  Filled 2018-02-20 (×4): qty 1

## 2018-02-20 MED ORDER — MIDAZOLAM HCL 2 MG/2ML IJ SOLN: 2.0000 mg | INTRAMUSCULAR | Status: DC | PRN

## 2018-02-20 MED ORDER — LACTATED RINGERS IV SOLN
INTRAVENOUS | Status: DC
  Administered 2018-02-21 (×2): via INTRAVENOUS

## 2018-02-20 SURGICAL SUPPLY — 92 items
ADH SKN CLS APL DERMABOND .7 (GAUZE/BANDAGES/DRESSINGS) ×4
BAG DECANTER FOR FLEXI CONT (MISCELLANEOUS) ×4 IMPLANT
BANDAGE ACE 4X5 VEL STRL LF (GAUZE/BANDAGES/DRESSINGS) ×4 IMPLANT
BANDAGE ACE 6X5 VEL STRL LF (GAUZE/BANDAGES/DRESSINGS) ×4 IMPLANT
BASKET HEART  (ORDER IN 25'S) (MISCELLANEOUS) ×1
BASKET HEART (ORDER IN 25'S) (MISCELLANEOUS) ×1
BASKET HEART (ORDER IN 25S) (MISCELLANEOUS) ×2 IMPLANT
BLADE CLIPPER SURG (BLADE) ×2 IMPLANT
BLADE STERNUM SYSTEM 6 (BLADE) ×4 IMPLANT
BLADE SURG 11 STRL SS (BLADE) ×2 IMPLANT
BNDG GAUZE ELAST 4 BULKY (GAUZE/BANDAGES/DRESSINGS) ×4 IMPLANT
CANISTER SUCT 3000ML PPV (MISCELLANEOUS) ×4 IMPLANT
CANNULA EZ GLIDE AORTIC 21FR (CANNULA) ×4 IMPLANT
CATH CPB KIT HENDRICKSON (MISCELLANEOUS) ×4 IMPLANT
CATH ROBINSON RED A/P 18FR (CATHETERS) ×4 IMPLANT
CATH THORACIC 36FR RT ANG (CATHETERS) ×4 IMPLANT
CLIP VESOCCLUDE MED 24/CT (CLIP) ×4 IMPLANT
CLIP VESOCCLUDE SM WIDE 24/CT (CLIP) ×14 IMPLANT
CRADLE DONUT ADULT HEAD (MISCELLANEOUS) ×4 IMPLANT
DERMABOND ADVANCED (GAUZE/BANDAGES/DRESSINGS) ×4
DERMABOND ADVANCED .7 DNX12 (GAUZE/BANDAGES/DRESSINGS) IMPLANT
DRAIN CHANNEL 32F RND 10.7 FF (WOUND CARE) ×2 IMPLANT
DRAPE CARDIOVASCULAR INCISE (DRAPES) ×4
DRAPE SLUSH/WARMER DISC (DRAPES) ×4 IMPLANT
DRAPE SRG 135X102X78XABS (DRAPES) ×2 IMPLANT
DRSG AQUACEL AG ADV 3.5X14 (GAUZE/BANDAGES/DRESSINGS) ×2 IMPLANT
DRSG COVADERM 4X14 (GAUZE/BANDAGES/DRESSINGS) ×4 IMPLANT
ELECT REM PT RETURN 9FT ADLT (ELECTROSURGICAL) ×8
ELECTRODE REM PT RTRN 9FT ADLT (ELECTROSURGICAL) ×4 IMPLANT
FELT TEFLON 1X6 (MISCELLANEOUS) ×6 IMPLANT
GAUZE SPONGE 4X4 12PLY STRL (GAUZE/BANDAGES/DRESSINGS) ×8 IMPLANT
GAUZE SPONGE 4X4 12PLY STRL LF (GAUZE/BANDAGES/DRESSINGS) ×4 IMPLANT
GLOVE BIOGEL PI IND STRL 6.5 (GLOVE) IMPLANT
GLOVE BIOGEL PI IND STRL 8.5 (GLOVE) IMPLANT
GLOVE BIOGEL PI INDICATOR 6.5 (GLOVE) ×6
GLOVE BIOGEL PI INDICATOR 8.5 (GLOVE) ×4
GLOVE SURG SIGNA 7.5 PF LTX (GLOVE) ×12 IMPLANT
GOWN STRL REUS W/ TWL LRG LVL3 (GOWN DISPOSABLE) ×8 IMPLANT
GOWN STRL REUS W/ TWL XL LVL3 (GOWN DISPOSABLE) ×4 IMPLANT
GOWN STRL REUS W/TWL LRG LVL3 (GOWN DISPOSABLE) ×16
GOWN STRL REUS W/TWL XL LVL3 (GOWN DISPOSABLE) ×8
HEMOSTAT ARISTA ABSORB 3G PWDR (MISCELLANEOUS) ×4 IMPLANT
HEMOSTAT SURGICEL 2X14 (HEMOSTASIS) ×4 IMPLANT
INSERT FOGARTY XLG (MISCELLANEOUS) IMPLANT
KIT BASIN OR (CUSTOM PROCEDURE TRAY) ×4 IMPLANT
KIT SUCTION CATH 14FR (SUCTIONS) ×8 IMPLANT
KIT TURNOVER KIT B (KITS) ×4 IMPLANT
KIT VASOVIEW HEMOPRO VH 3000 (KITS) ×4 IMPLANT
MARKER GRAFT CORONARY BYPASS (MISCELLANEOUS) ×12 IMPLANT
NS IRRIG 1000ML POUR BTL (IV SOLUTION) ×20 IMPLANT
PACK E OPEN HEART (SUTURE) ×4 IMPLANT
PACK OPEN HEART (CUSTOM PROCEDURE TRAY) ×4 IMPLANT
PAD ARMBOARD 7.5X6 YLW CONV (MISCELLANEOUS) ×8 IMPLANT
PAD ELECT DEFIB RADIOL ZOLL (MISCELLANEOUS) ×4 IMPLANT
PENCIL BUTTON HOLSTER BLD 10FT (ELECTRODE) ×4 IMPLANT
PUNCH AORTIC ROTATE 4.0MM (MISCELLANEOUS) IMPLANT
PUNCH AORTIC ROTATE 4.5MM 8IN (MISCELLANEOUS) ×2 IMPLANT
PUNCH AORTIC ROTATE 5MM 8IN (MISCELLANEOUS) IMPLANT
SET CARDIOPLEGIA MPS 5001102 (MISCELLANEOUS) ×2 IMPLANT
SUT BONE WAX W31G (SUTURE) ×4 IMPLANT
SUT MNCRL AB 4-0 PS2 18 (SUTURE) ×4 IMPLANT
SUT PROLENE 3 0 SH DA (SUTURE) ×4 IMPLANT
SUT PROLENE 4 0 RB 1 (SUTURE) ×8
SUT PROLENE 4 0 SH DA (SUTURE) IMPLANT
SUT PROLENE 4-0 RB1 .5 CRCL 36 (SUTURE) IMPLANT
SUT PROLENE 4-0 RB1 18X2 ARM (SUTURE) IMPLANT
SUT PROLENE 6 0 C 1 30 (SUTURE) ×12 IMPLANT
SUT PROLENE 7 0 BV 1 (SUTURE) ×2 IMPLANT
SUT PROLENE 7 0 BV1 MDA (SUTURE) ×6 IMPLANT
SUT PROLENE 8 0 BV175 6 (SUTURE) IMPLANT
SUT STEEL 6MS V (SUTURE) ×4 IMPLANT
SUT STEEL STERNAL CCS#1 18IN (SUTURE) IMPLANT
SUT STEEL SZ 6 DBL 3X14 BALL (SUTURE) ×4 IMPLANT
SUT VIC AB 1 CTX 36 (SUTURE) ×8
SUT VIC AB 1 CTX36XBRD ANBCTR (SUTURE) ×4 IMPLANT
SUT VIC AB 2-0 CT1 27 (SUTURE) ×8
SUT VIC AB 2-0 CT1 TAPERPNT 27 (SUTURE) IMPLANT
SUT VIC AB 2-0 CTX 27 (SUTURE) IMPLANT
SUT VIC AB 3-0 SH 27 (SUTURE)
SUT VIC AB 3-0 SH 27X BRD (SUTURE) IMPLANT
SUT VIC AB 3-0 X1 27 (SUTURE) IMPLANT
SUT VICRYL 4-0 PS2 18IN ABS (SUTURE) IMPLANT
SYSTEM SAHARA CHEST DRAIN ATS (WOUND CARE) ×4 IMPLANT
TAPE CLOTH SURG 4X10 WHT LF (GAUZE/BANDAGES/DRESSINGS) ×4 IMPLANT
TAPE PAPER 2X10 WHT MICROPORE (GAUZE/BANDAGES/DRESSINGS) ×2 IMPLANT
TOWEL GREEN STERILE (TOWEL DISPOSABLE) ×4 IMPLANT
TOWEL GREEN STERILE FF (TOWEL DISPOSABLE) ×4 IMPLANT
TRAY FOLEY SLVR 16FR TEMP STAT (SET/KITS/TRAYS/PACK) ×4 IMPLANT
TUBE FEEDING 8FR 16IN STR KANG (MISCELLANEOUS) ×4 IMPLANT
TUBING INSUFFLATION (TUBING) ×4 IMPLANT
UNDERPAD 30X30 (UNDERPADS AND DIAPERS) ×4 IMPLANT
WATER STERILE IRR 1000ML POUR (IV SOLUTION) ×8 IMPLANT

## 2018-02-20 NOTE — Transfer of Care (Signed)
Immediate Anesthesia Transfer of Care Note  Patient: Leslie Cobb  Procedure(s) Performed: CORONARY ARTERY BYPASS GRAFTING (CABG) x 4 (LIMA to LAD, SVG SEQUENTIALLY to RAMUS 1 and 2, SVG to PLB) with EVH from RIGHT GREATER SAPHENOUS VEIN and LEFT INTERNAL MAMMARY ARTERY HARVEST (N/A Chest) TRANSESOPHAGEAL ECHOCARDIOGRAM (TEE) (N/A )  Patient Location: SICU  Anesthesia Type:General  Level of Consciousness: Patient remains intubated per anesthesia plan  Airway & Oxygen Therapy: Patient remains intubated per anesthesia plan and Patient placed on Ventilator (see vital sign flow sheet for setting)  Post-op Assessment: Report given to RN and Post -op Vital signs reviewed and stable  Post vital signs: Reviewed and stable  Last Vitals:  Vitals Value Taken Time  BP    Temp    Pulse 90 02/20/2018  1:54 PM  Resp 16 02/20/2018  1:54 PM  SpO2 93 % 02/20/2018  1:54 PM  Vitals shown include unvalidated device data.  Last Pain:  Vitals:   02/20/18 0515  TempSrc: Oral  PainSc:       Patients Stated Pain Goal: 0 (02/19/18 1622)  Complications: No apparent anesthesia complications

## 2018-02-20 NOTE — Progress Notes (Signed)
  Echocardiogram Echocardiogram Transesophageal has been performed.  Leslie SkeenVijay  [name] 02/20/2018, 9:25 AM

## 2018-02-20 NOTE — Progress Notes (Signed)
Patient ID: Leslie Cobb, female   DOB: 06/25/1956, 62 y.o.   MRN: [id number] EVENING ROUNDS NOTE :     [address]       [place] [id number]             [phone]                 Day of Surgery Procedure(s) (LRB): CORONARY ARTERY BYPASS GRAFTING (CABG) x 4 (LIMA to LAD, SVG SEQUENTIALLY to RAMUS 1 and 2, SVG to PLB) with EVH from RIGHT GREATER SAPHENOUS VEIN and LEFT INTERNAL MAMMARY ARTERY HARVEST (N/A) TRANSESOPHAGEAL ECHOCARDIOGRAM (TEE) (N/A)  Total Length of Stay:  LOS: 2 days  BP 132/68   Pulse 90   Temp 97.7 F (36.5 C)   Resp 19   Ht 5\' 4"  (1.626 m)   Wt 205 lb 8 oz (93.2 kg)   SpO2 98%   BMI 35.27 kg/m   .Intake/Output      06/11 0701 - 06/12 0700 06/12 0701 - 06/13 0700   P.O. 840    I.V. (mL/kg) 287.9 (3.1) 2880.5 (30.9)   Blood  400   IV Piggyback 0 1893.5   Total Intake(mL/kg) 1127.9 (12.1) 5174 (55.5)   Urine (mL/kg/hr) 2300 (1) 1485 (1.4)   Blood  600   Chest Tube  110   Total Output 2300 2195   Net -1172.1 +2979          . sodium chloride 20 mL/hr at 02/20/18 1350  . [START ON 02/21/2018] sodium chloride    . sodium chloride 20 mL/hr at 02/20/18 1350  . albumin human 250 mL (02/20/18 1430)  . cefUROXime (ZINACEF)  IV Stopped (02/20/18 1740)  . dexmedetomidine (PRECEDEX) IV infusion Stopped (02/20/18 1700)  . famotidine (PEPCID) IV Stopped (02/20/18 1420)  . insulin (NOVOLIN-R) infusion 4.1 Units/hr (02/20/18 1811)  . lactated ringers    . lactated ringers 20 mL/hr at 02/20/18 1350  . lactated ringers 20 mL/hr at 02/20/18 1350  . magnesium sulfate 4 g (02/20/18 1522)  . nitroGLYCERIN 20 mcg/min (02/20/18 1745)  . phenylephrine (NEO-SYNEPHRINE) Adult infusion Stopped (02/20/18 1350)  . vancomycin       Lab Results  Component Value Date   WBC 7.4 02/20/2018   HGB 9.4 (L) 02/20/2018   HCT 28.1 (L) 02/20/2018   PLT 128 (L) 02/20/2018   GLUCOSE 143 (H) 02/20/2018   CHOL 132 02/20/2018   TRIG 143 02/20/2018   HDL  46 02/20/2018   LDLCALC 57 02/20/2018   NA 141 02/20/2018   K 3.8 02/20/2018   CL 100 (L) 02/20/2018   CREATININE 0.60 02/20/2018   BUN 9 02/20/2018   CO2 26 02/19/2018   INR 1.38 02/20/2018   HGBA1C 7.0 (H) 02/19/2018   cabg today Now extubated neuro intact Not bleeding On ntg and insulin   Delight OvensEdward B Lindy Garczynski MD  Beeper [phone] Office [phone] 02/20/2018 6:45 PM

## 2018-02-20 NOTE — Anesthesia Procedure Notes (Signed)
Central Venous Catheter Insertion Performed by: Marcene DuosFitzgerald, Ravis Herne, MD, anesthesiologist Start/End6/08/2018 7:50 AM, 02/20/2018 8:00 AM Patient location: Pre-op. Preanesthetic checklist: patient identified, IV checked, site marked, risks and benefits discussed, surgical consent, monitors and equipment checked, pre-op evaluation, timeout performed and anesthesia consent Hand hygiene performed  and maximum sterile barriers used  PA cath was placed.Swan type:thermodilution PA Cath depth:40 Procedure performed without using ultrasound guided technique. Attempts: 1 Patient tolerated the procedure well with no immediate complications.

## 2018-02-20 NOTE — Anesthesia Preprocedure Evaluation (Addendum)
Anesthesia Evaluation  Patient identified by MRN, date of birth, ID band Patient awake    Reviewed: Allergy & Precautions, NPO status , Patient's Chart, lab work & pertinent test results, reviewed documented beta blocker date and time   Airway Mallampati: III  TM Distance: >3 FB Neck ROM: Full    Dental no notable dental hx.    Pulmonary neg pulmonary ROS,    Pulmonary exam normal breath sounds clear to auscultation       Cardiovascular hypertension, Pt. on home beta blockers and Pt. on medications + CAD  Normal cardiovascular exam Rhythm:Regular Rate:Normal  ECG: NSR, rate 61  CATH: Ost LAD to Prox LAD lesion is 95% stenosed. Prox LAD lesion is 80% stenosed. Ost Ramus to Ramus lesion is 90% stenosed. Ramus lesion is 45% stenosed. Lat Ramus lesion is 100% stenosed. Prox Cx lesion is 85% stenosed. Mid Cx lesion is 70% stenosed. 1st RPLB lesion is 95% stenosed. The left ventricular systolic function is normal. The left ventricular ejection fraction is 55-65% by visual estimate. LV end diastolic pressure is mildly elevated. 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 Wall thickness was increased in a pattern of mild LVH. Systolic function was normal. The estimated ejection fraction was in the range of 55% to 60%. Wall motion was normal; there were no regional wall motion abnormalities. Aortic valve: Mildly to moderately calcified annulus. Trileaflet; mildly calcified leaflets. Mitral valve: There was trivial regurgitation. Right atrium: Central venous pressure (est): 3 mm Hg. Atrial septum: No defect or patent foramen ovale was identified. Tricuspid valve: There was trivial regurgitation. Pulmonary arteries: Systolic pressure could not be accurately estimated. Pericardium, extracardiac: A prominent pericardial fat pad was  present.  Myocardial perfusion: Nonspecific ST segment changes in the high lateral leads and aVR. Medium sized, mild intensity, partially reversible anterolateral defect from mid to apical level consistent with ischemia although breast attenuation is also present. This is an intermediate risk study. Nuclear stress EF: 64%.   Neuro/Psych negative neurological ROS  negative psych ROS   GI/Hepatic negative GI ROS, Neg liver ROS,   Endo/Other  diabetes, Oral Hypoglycemic Agents  Renal/GU negative Renal ROS     Musculoskeletal Low back pain   Abdominal (+) + obese,   Peds  Hematology  (+) anemia ,   Anesthesia Other Findings   Reproductive/Obstetrics                            Anesthesia Physical Anesthesia Plan  ASA: IV  Anesthesia Plan: General   Post-op Pain Management:    Induction: Intravenous  PONV Risk Score and Plan: 3 and Ondansetron, Dexamethasone, Midazolam and Treatment may vary due to age or medical condition  Airway Management Planned: Oral ETT  Additional Equipment: Arterial line, CVP, PA Cath, TEE and Ultrasound Guidance Line Placement  Intra-op Plan:   Post-operative Plan: Extubation in OR  Informed Consent: I have reviewed the patients History and Physical, chart, labs and discussed the procedure including the risks, benefits and alternatives for the proposed anesthesia with the patient or authorized representative who has indicated his/her understanding and acceptance.   Dental advisory given  Plan Discussed with: CRNA  Anesthesia Plan Comments:         Anesthesia Quick Evaluation

## 2018-02-20 NOTE — Brief Op Note (Signed)
02/18/2018 - 02/20/2018  7:27 PM  PATIENT:  Leslie Cobb  62 y.o. female  PRE-OPERATIVE DIAGNOSIS:  3 VESSEL CAD  POST-OPERATIVE DIAGNOSIS:  3 VESSEL CAD  PROCEDURE: TRANSESOPHAGEAL ECHOCARDIOGRAM (TEE),  MEDIAN STERNOTOMY for CORONARY ARTERY BYPASS GRAFTING (CABG) x 4  LIMA to LAD,   SVG SEQUENTIALLY to RAMUS 1 and 2,   SVG to PLB EVH from RIGHT GREATER SAPHENOUS VEIN and LEFT INTERNAL MAMMARY ARTERY HARVEST  SURGEON:  Surgeon(s) and Role:    Loreli SlotHendrickson, Pauletta Pickney C, MD - Primary  PHYSICIAN ASSISTANT: Doree Fudgeonielle Zimmerman PA-C  ASSISTANTS: Benay SpiceMarie Irwin RNFA   ANESTHESIA:   general  EBL:  600 mL   DRAINS: Chest tubes placed in the mediastinal and pleural spaces   COUNTS CORRECT:   YES  DICTATION: .Dragon Dictation  PLAN OF CARE: Admit to inpatient   PATIENT DISPOSITION:  ICU - intubated and hemodynamically stable.   Delay start of Pharmacological VTE agent (>24hrs) due to surgical blood loss or risk of bleeding: yes   BASELINE WEIGHT: 93.2 kg  XC= 66 min CPB= 97 min  Good targets, vein fair, LIMA good

## 2018-02-20 NOTE — Anesthesia Procedure Notes (Signed)
Arterial Line Insertion Start/End6/08/2018 8:00 AM, 02/20/2018 8:15 AM Performed by: Carmela RimaMartinelli, Emmely Bittinger F, CRNA, CRNA  Patient location: Pre-op. Preanesthetic checklist: patient identified, IV checked, site marked, risks and benefits discussed and surgical consent Patient sedated Left, radial was placed Catheter size: 20 G Hand hygiene performed  and maximum sterile barriers used   Attempts: 1 Procedure performed without using ultrasound guided technique. Following insertion, dressing applied and Biopatch. Post procedure assessment: normal and unchanged  Patient tolerated the procedure well with no immediate complications.

## 2018-02-20 NOTE — Anesthesia Procedure Notes (Signed)
Central Venous Catheter Insertion Performed by: Marcene DuosFitzgerald, Benino Korinek, MD, anesthesiologist Start/End6/08/2018 7:50 AM, 02/20/2018 8:00 AM Patient location: Pre-op. Preanesthetic checklist: patient identified, IV checked, site marked, risks and benefits discussed, surgical consent, monitors and equipment checked, pre-op evaluation, timeout performed and anesthesia consent Position: Trendelenburg Lidocaine 1% used for infiltration and patient sedated Hand hygiene performed , maximum sterile barriers used  and Seldinger technique used Catheter size: 8.5 Fr Total catheter length 10. Central line and PA cath was placed.Sheath introducer Swan type:thermodilution PA Cath depth:40 Procedure performed using ultrasound guided technique. Ultrasound Notes:anatomy identified, needle tip was noted to be adjacent to the nerve/plexus identified, no ultrasound evidence of intravascular and/or intraneural injection and image(s) printed for medical record Attempts: 1 Following insertion, line sutured, dressing applied and Biopatch. Post procedure assessment: blood return through all ports, free fluid flow and no air  Patient tolerated the procedure well with no immediate complications.

## 2018-02-20 NOTE — Anesthesia Postprocedure Evaluation (Signed)
Anesthesia Post Note  Patient: Arrie AranDiane Elizabeth Schreiner  Procedure(s) Performed: CORONARY ARTERY BYPASS GRAFTING (CABG) x 4 (LIMA to LAD, SVG SEQUENTIALLY to RAMUS 1 and 2, SVG to PLB) with EVH from RIGHT GREATER SAPHENOUS VEIN and LEFT INTERNAL MAMMARY ARTERY HARVEST (N/A Chest) TRANSESOPHAGEAL ECHOCARDIOGRAM (TEE) (N/A )     Patient location during evaluation: ICU Anesthesia Type: General Level of consciousness: sedated Pain management: pain level controlled Vital Signs Assessment: post-procedure vital signs reviewed and stable Respiratory status: patient remains intubated per anesthesia plan Cardiovascular status: stable Postop Assessment: no apparent nausea or vomiting Anesthetic complications: no    Last Vitals:  Vitals:   02/20/18 2000 02/20/18 2100  BP: (!) 97/58 113/71  Pulse: 90 90  Resp: 18 18  Temp: 37.1 C 37.1 C  SpO2: 97% 99%    Last Pain:  Vitals:   02/20/18 2018  TempSrc:   PainSc: 8                  Joseph Johns P Maureen Delatte

## 2018-02-20 NOTE — Anesthesia Procedure Notes (Signed)
Procedure Name: Intubation Date/Time: 02/20/2018 8:41 AM Performed by: Neldon Newport, CRNA Pre-anesthesia Checklist: Timeout performed, Patient being monitored, Suction available, Emergency Drugs available and Patient identified Patient Re-evaluated:Patient Re-evaluated prior to induction Oxygen Delivery Method: Circle system utilized Preoxygenation: Pre-oxygenation with 100% oxygen Induction Type: IV induction Ventilation: Mask ventilation without difficulty Laryngoscope Size: Mac and 3 Grade View: Grade I Tube type: Oral Tube size: 8.0 mm Number of attempts: 1 Placement Confirmation: breath sounds checked- equal and bilateral,  positive ETCO2 and ETT inserted through vocal cords under direct vision Secured at: 21 cm Tube secured with: Tape Dental Injury: Teeth and Oropharynx as per pre-operative assessment

## 2018-02-20 NOTE — Procedures (Signed)
Extubation Procedure Note  Patient Details:   Name: Leslie Cobb DOB: August 11, 1956 MRN: [id number]   Airway Documentation:    Vent end date: 02/20/18 Vent end time: 1843   Evaluation  O2 sats: stable throughout Complications: No apparent complications Patient did tolerate procedure well. Bilateral Breath Sounds: Clear   Yes pt able to vocalize.  Pt extubated at this time per Rapid wean protocol. Patient able to breathe around deflated cuff. No stridor noted. Pt placed on 4L [place] and tolerating well.    Loyal Jacobsonhompson, Keala Drum [facility] 02/20/2018, 6:56 PM

## 2018-02-20 NOTE — Interval H&P Note (Signed)
History and Physical Interval Note:  02/20/2018 8:18 AM  Leslie Cobb  has presented today for surgery, with the diagnosis of CAD  The various methods of treatment have been discussed with the patient and family. After consideration of risks, benefits and other options for treatment, the patient has consented to  Procedure(s): CORONARY ARTERY BYPASS GRAFTING (CABG) (N/A) TRANSESOPHAGEAL ECHOCARDIOGRAM (TEE) (N/A) as a surgical intervention .  The patient's history has been reviewed, patient examined, no change in status, stable for surgery.  I have reviewed the patient's chart and labs.  Questions were answered to the patient's satisfaction.     Loreli SlotSteven C Hendrickson

## 2018-02-21 ENCOUNTER — Inpatient Hospital Stay (HOSPITAL_COMMUNITY): Payer: BC Managed Care – PPO

## 2018-02-21 ENCOUNTER — Encounter (HOSPITAL_COMMUNITY): Payer: Self-pay | Admitting: Thoracic Surgery (Cardiothoracic Vascular Surgery)

## 2018-02-21 LAB — GLUCOSE, CAPILLARY
GLUCOSE-CAPILLARY: 101 mg/dL — AB (ref 65–99)
GLUCOSE-CAPILLARY: 102 mg/dL — AB (ref 65–99)
GLUCOSE-CAPILLARY: 108 mg/dL — AB (ref 65–99)
GLUCOSE-CAPILLARY: 113 mg/dL — AB (ref 65–99)
GLUCOSE-CAPILLARY: 116 mg/dL — AB (ref 65–99)
GLUCOSE-CAPILLARY: 118 mg/dL — AB (ref 65–99)
GLUCOSE-CAPILLARY: 149 mg/dL — AB (ref 65–99)
GLUCOSE-CAPILLARY: 157 mg/dL — AB (ref 65–99)
GLUCOSE-CAPILLARY: 166 mg/dL — AB (ref 65–99)
GLUCOSE-CAPILLARY: 84 mg/dL (ref 65–99)
Glucose-Capillary: 119 mg/dL — ABNORMAL HIGH (ref 65–99)
Glucose-Capillary: 102 mg/dL — ABNORMAL HIGH (ref 65–99)
Glucose-Capillary: 102 mg/dL — ABNORMAL HIGH (ref 65–99)
Glucose-Capillary: 235 mg/dL — ABNORMAL HIGH (ref 65–99)
Glucose-Capillary: 204 mg/dL — ABNORMAL HIGH (ref 65–99)

## 2018-02-21 LAB — CBC
HCT: 27.7 % — ABNORMAL LOW (ref 36.0–46.0)
HCT: 28.1 % — ABNORMAL LOW (ref 36.0–46.0)
HEMOGLOBIN: 9.2 g/dL — AB (ref 12.0–15.0)
Hemoglobin: 9.1 g/dL — ABNORMAL LOW (ref 12.0–15.0)
MCH: 29 pg (ref 26.0–34.0)
MCH: 29 pg (ref 26.0–34.0)
MCHC: 33.2 g/dL (ref 30.0–36.0)
MCHC: 32.4 g/dL (ref 30.0–36.0)
MCV: 87.4 fL (ref 78.0–100.0)
MCV: 89.5 fL (ref 78.0–100.0)
PLATELETS: 188 10*3/uL (ref 150–400)
Platelets: 169 10*3/uL (ref 150–400)
RBC: 3.17 MIL/uL — AB (ref 3.87–5.11)
RBC: 3.14 MIL/uL — ABNORMAL LOW (ref 3.87–5.11)
RDW: 14.2 % (ref 11.5–15.5)
RDW: 14.8 % (ref 11.5–15.5)
WBC: 10.3 10*3/uL (ref 4.0–10.5)
WBC: 10.3 10*3/uL (ref 4.0–10.5)

## 2018-02-21 LAB — MAGNESIUM
MAGNESIUM: 2 mg/dL (ref 1.7–2.4)
MAGNESIUM: 1.8 mg/dL (ref 1.7–2.4)

## 2018-02-21 LAB — BASIC METABOLIC PANEL
ANION GAP: 9 (ref 5–15)
BUN: 11 mg/dL (ref 6–20)
CHLORIDE: 106 mmol/L (ref 101–111)
CO2: 25 mmol/L (ref 22–32)
Calcium: 7.9 mg/dL — ABNORMAL LOW (ref 8.9–10.3)
Creatinine, Ser: 0.88 mg/dL (ref 0.44–1.00)
Glucose, Bld: 101 mg/dL — ABNORMAL HIGH (ref 65–99)
Potassium: 4.3 mmol/L (ref 3.5–5.1)
SODIUM: 140 mmol/L (ref 135–145)

## 2018-02-21 LAB — POCT I-STAT, CHEM 8
BUN: 14 mg/dL (ref 6–20)
CALCIUM ION: 1.13 mmol/L — AB (ref 1.15–1.40)
CHLORIDE: 97 mmol/L — AB (ref 101–111)
CREATININE: 1 mg/dL (ref 0.44–1.00)
Glucose, Bld: 266 mg/dL — ABNORMAL HIGH (ref 65–99)
HEMATOCRIT: 26 % — AB (ref 36.0–46.0)
Hemoglobin: 8.8 g/dL — ABNORMAL LOW (ref 12.0–15.0)
Potassium: 3.7 mmol/L (ref 3.5–5.1)
SODIUM: 134 mmol/L — AB (ref 135–145)
TCO2: 21 mmol/L — ABNORMAL LOW (ref 22–32)

## 2018-02-21 LAB — CREATININE, SERUM
CREATININE: 1.22 mg/dL — AB (ref 0.44–1.00)
GFR calc Af Amer: 55 mL/min — ABNORMAL LOW (ref >60)
GFR, EST NON AFRICAN AMERICAN: 47 mL/min — AB (ref >60)

## 2018-02-21 MED ORDER — INSULIN ASPART 100 UNIT/ML ~~LOC~~ SOLN
4.0000 [IU] | Freq: Three times a day (TID) | SUBCUTANEOUS | Status: DC
  Administered 2018-02-21 – 2018-02-22 (×3): 4 [IU] via SUBCUTANEOUS

## 2018-02-21 MED ORDER — POTASSIUM CHLORIDE 10 MEQ/50ML IV SOLN
10.0000 meq | INTRAVENOUS | Status: AC
  Administered 2018-02-21 (×3): 10 meq via INTRAVENOUS
  Filled 2018-02-21 (×2): qty 50

## 2018-02-21 MED ORDER — GLIPIZIDE ER 10 MG PO TB24
10.0000 mg | ORAL_TABLET | Freq: Every day | ORAL | Status: DC
  Administered 2018-02-21 – 2018-02-24 (×4): 10 mg via ORAL
  Filled 2018-02-21 (×6): qty 1

## 2018-02-21 MED ORDER — ENOXAPARIN SODIUM 40 MG/0.4ML ~~LOC~~ SOLN
40.0000 mg | Freq: Every day | SUBCUTANEOUS | Status: DC
  Administered 2018-02-21 – 2018-02-24 (×4): 40 mg via SUBCUTANEOUS
  Filled 2018-02-21 (×4): qty 0.4

## 2018-02-21 MED ORDER — INSULIN ASPART 100 UNIT/ML ~~LOC~~ SOLN
0.0000 [IU] | SUBCUTANEOUS | Status: DC
  Administered 2018-02-21 (×2): 8 [IU] via SUBCUTANEOUS
  Administered 2018-02-21 – 2018-02-22 (×2): 2 [IU] via SUBCUTANEOUS

## 2018-02-21 MED ORDER — INSULIN DETEMIR 100 UNIT/ML ~~LOC~~ SOLN
30.0000 [IU] | Freq: Two times a day (BID) | SUBCUTANEOUS | Status: DC
  Administered 2018-02-21 (×2): 30 [IU] via SUBCUTANEOUS
  Filled 2018-02-21 (×3): qty 0.3

## 2018-02-21 MED ORDER — METFORMIN HCL 500 MG PO TABS
750.0000 mg | ORAL_TABLET | Freq: Two times a day (BID) | ORAL | Status: DC
  Administered 2018-02-21 – 2018-02-25 (×8): 750 mg via ORAL
  Filled 2018-02-21 (×9): qty 2

## 2018-02-21 MED ORDER — FUROSEMIDE 10 MG/ML IJ SOLN
40.0000 mg | Freq: Once | INTRAMUSCULAR | Status: AC
  Administered 2018-02-21: 40 mg via INTRAVENOUS
  Filled 2018-02-21: qty 4

## 2018-02-21 MED FILL — Magnesium Sulfate Inj 50%: INTRAMUSCULAR | Qty: 10 | Status: AC

## 2018-02-21 MED FILL — Potassium Chloride Inj 2 mEq/ML: INTRAVENOUS | Qty: 40 | Status: AC

## 2018-02-21 MED FILL — Heparin Sodium (Porcine) Inj 1000 Unit/ML: INTRAMUSCULAR | Qty: 30 | Status: AC

## 2018-02-21 NOTE — Progress Notes (Signed)
1 Day Post-Op Procedure(s) (LRB): CORONARY ARTERY BYPASS GRAFTING (CABG) x 4 (LIMA to LAD, SVG SEQUENTIALLY to RAMUS 1 and 2, SVG to PLB) with EVH from RIGHT GREATER SAPHENOUS VEIN and LEFT INTERNAL MAMMARY ARTERY HARVEST (N/A) TRANSESOPHAGEAL ECHOCARDIOGRAM (TEE) (N/A) Subjective: C/o incisional pain  Objective: Vital signs in last 24 hours: Temp:  [96.3 F (35.7 C)-99.3 F (37.4 C)] 99 F (37.2 C) (06/13 0800) Pulse Rate:  [70-90] 78 (06/13 0800) Cardiac Rhythm: Normal sinus rhythm (06/13 0800) Resp:  [12-25] 18 (06/13 0800) BP: (80-132)/(39-72) 114/62 (06/13 0800) SpO2:  [94 %-100 %] 97 % (06/13 0800) Arterial Line BP: (97-192)/(43-96) 150/58 (06/13 0800) FiO2 (%):  [40 %-50 %] 40 % (06/12 1800) Weight:  [219 lb 9.3 oz (99.6 kg)] 219 lb 9.3 oz (99.6 kg) (06/13 0500)  Hemodynamic parameters for last 24 hours: PAP: (17-37)/(10-20) 36/20 CO:  [4.2 L/min-5.6 L/min] 5.4 L/min CI:  [2.1 L/min/m2-2.8 L/min/m2] 2.7 L/min/m2  Intake/Output from previous day: 06/12 0701 - 06/13 0700 In: 9978.8 [I.V.:3995.2; Blood:400; IV Piggyback:5583.5] Out: 2658 [Urine:1790; Blood:600; Chest Tube:268] Intake/Output this shift: Total I/O In: 165.8 [I.V.:165.8] Out: 45 [Urine:45]  General appearance: alert, cooperative and no distress Neurologic: intact Heart: regular rate and rhythm Lungs: diminished breath sounds bibasilar Abdomen: mildly distended, + BS, nontnedr  Lab Results: Recent Labs    02/20/18 1959 02/20/18 2014 02/21/18 0427  WBC 8.8  --  10.3  HGB 9.6* 7.8* 9.2*  HCT 28.5* 23.0* 27.7*  PLT 141*  --  188   BMET:  Recent Labs    02/19/18 2027  02/20/18 2014 02/21/18 0427  NA 138   < > 141 140  K 3.6   < > 4.2 4.3  CL 103   < > 105 106  CO2 26  --   --  25  GLUCOSE 209*   < > 133* 101*  BUN 12   < > 8 11  CREATININE 1.02*   < > 0.70 0.88  CALCIUM 9.5  --   --  7.9*   < > = values in this interval not displayed.    PT/INR:  Recent Labs    02/20/18 1359   LABPROT 16.9*  INR 1.38   ABG    Component Value Date/Time   PHART 7.340 (L) 02/20/2018 2008   HCO3 23.7 02/20/2018 2008   TCO2 23 02/20/2018 2014   ACIDBASEDEF 2.0 02/20/2018 2008   O2SAT 97.0 02/20/2018 2008   CBG (last 3)  Recent Labs    02/21/18 0555 02/21/18 0701 02/21/18 0751  GLUCAP 102* 102* 113*    Assessment/Plan: S/P Procedure(s) (LRB): CORONARY ARTERY BYPASS GRAFTING (CABG) x 4 (LIMA to LAD, SVG SEQUENTIALLY to RAMUS 1 and 2, SVG to PLB) with EVH from RIGHT GREATER SAPHENOUS VEIN and LEFT INTERNAL MAMMARY ARTERY HARVEST (N/A) TRANSESOPHAGEAL ECHOCARDIOGRAM (TEE) (N/A) -CV- hemodynamically stable with good CO- dc swan and A line  ASA, beta blocker, statin  RESP- IS  RENAL- creatinine normal, lytes OK  Diurese for volume overload  ENDO- type II DM, CBG well controlled on insulin drip-  Restart PO meds  Transition to levemir + SSI  Anemia secondary to ABL- mild, follow  SCD + enoxaparin for DVT prophylaxis  Mobilize  Dc chest tubes   LOS: 3 days    Leslie Cobb 02/21/2018

## 2018-02-21 NOTE — Discharge Summary (Signed)
Physician Discharge Summary       [address]       [place] [id number]             [phone]    Patient ID: Leslie AranDiane Elizabeth Cobb MRN: [id number] DOB/AGE: December 16, 1955 62 y.o.  Admit date: 02/18/2018 Discharge date: 02/25/2018  Admission Diagnoses: 1.  Angina, class III (HCC) 2.  CAD (coronary artery disease)  Discharge Diagnoses:  1. S/P CABG x 4 2. ABL anemia 3. History of type 2 diabetes mellitus (HCC) 4. History of hyperlipidemia 5. History of hypertension 6. History of obesity 7. History of low back pain 8. History of vaginal Pap smear, abnormal  Procedure (s):  LEFT HEART CATH AND CORONARY ANGIOGRAPHY by Dr. Herbie BaltimoreHarding on 02/18/2018:  Conclusion     Ost LAD to Prox LAD lesion is 95% stenosed. Prox LAD lesion is 80% stenosed.  Ost Ramus to Ramus lesion is 90% stenosed. Ramus lesion is 45% stenosed. Lat Ramus lesion is 100% stenosed.  Prox Cx lesion is 85% stenosed. Mid Cx lesion is 70% stenosed.  1st RPLB lesion is 95% stenosed.  The left ventricular systolic function is normal. The left ventricular ejection fraction is 55-65% by visual estimate.  LV end diastolic pressure is mildly elevated.    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 Multivessel PCI. Convert to High Dose Statin Continue Beta Blocker Convert from Metformin to SSI   Median sternotomy, extracorporeal circulation, coronary artery bypass grafting x4 (left internal mammary artery to left anterior descending, sequential saphenous vein graft to ramus intermedius 1 and 2, saphenous vein graft to  posterolateral)  endoscopic vein harvest right leg by Dr. Dorris FetchHendrickson on 02/20/2018.    History of Presenting Illness: Leslie Cobb is a 62 yo woman with multiple CRF including, type 2 diabetes without complication, hypertension, hyperlipidemia, obesity and a strong family  history of CAD. She has no prior history of CAD herself. She has been having exertional chest tightness and shortness of breath for several months. She was referred to Dr. Diona BrownerMcDowell. An echo showed preserved LV function with aortic sclerosis. A Lexiscan Myoview showed NSST changes and an aterolateral reversible defect. She had cath today which revealed 3 vessel CAD. Symptoms have been exertional only, but have become more frequent since the stress test. Dr. Dorris FetchHendrickson discussed the general nature of the procedure, the need for general anesthesia, the use of cardiopulmonary bypass, and the incisions to be used with Leslie Cobb. Dr. Dorris FetchHendrickson discussed the expected hospital stay, overall recovery and short and long term outcomes. Dr. Dorris FetchHendrickson  informed her of the indications, risks, benefits and alternatives. She understands the risks and agrees to proceed with surgery. Pre operative carotid duplex US showed no significant internal carotid artery stenosis bilaterally. Patient underwent a CABG x 4 on 02/20/2018.   Brief Hospital Course:  The patient was extubated the evening of surgery without difficulty. She remained afebrile and hemodynamically stable. Theone MurdochSwan Ganz, a line, chest tubes, and foley were removed early in the post operative course. Lopressor was started and titrated accordingly. She was volume over loaded and diuresed. She had ABL anemia. She did not require a post op transfusion. Last H and H was 8.2 and 25.4. She was weaned off the insulin drip.  Once she was tolerating a diet, Metformin and Glipizide XL were restarted.  The patient's glucose remained well controlled.The patient's HGA1C  pre op was  7. The patient was felt surgically stable for transfer from the ICU to PCTU for further convalescence on 02/22/2018. She continues to progress with cardiac rehab. She was ambulating on room air. She has been tolerating a diet and has had a bowel movement. Epicardial pacing wires were removed on  02/24/2018. She was restarted on low dose Losartan for better BP control. Chest tube sutures will be removed 06/17, the day of discharge. Dr. Dorris Fetch saw and evaluated the patient today. The patient is felt surgically stable for discharge today.   Latest Vital Signs: Blood pressure (!) 153/77, pulse 89, temperature 98.6 F (37 C), temperature source Oral, resp. rate 18, height 5\' 4"  (1.626 m), weight 211 lb 6.4 oz (95.9 kg), SpO2 98 %.   Physical Exam: Cardiovascular: RRR Pulmonary: Clear to auscultation bilaterally Abdomen: Soft, non tender, bowel sounds present. Extremities: Mild bilateral lower extremity edema. Wounds: Clean and dry.  No erythema or signs of infection.  Discharge Condition: Stable and discharged to home.  Recent laboratory studies:  Lab Results  Component Value Date   WBC 8.7 02/23/2018   HGB 8.2 (L) 02/23/2018   HCT 25.4 (L) 02/23/2018   MCV 89.4 02/23/2018   PLT 159 02/23/2018   Lab Results  Component Value Date   NA 133 (L) 02/23/2018   K 4.4 02/23/2018   CL 100 (L) 02/23/2018   CO2 25 02/23/2018   CREATININE 0.87 02/23/2018   GLUCOSE 105 (H) 02/23/2018     Diagnostic Studies: Dg Chest 2 View  Result Date: 02/23/2018 CLINICAL DATA:  Post CABG EXAM: CHEST - 2 VIEW COMPARISON:  02/23/2012 FINDINGS: Changes of CABG. Cardiomegaly. Hiatal hernia present. Left base atelectasis or infiltrate, improving since prior study. No focal opacity on the right. No effusions or acute bony abnormality. IMPRESSION: Left base atelectasis or infiltrate, improving since prior study. Cardiomegaly, hiatal hernia. Electronically Signed   By: Charlett Nose M.D.   On: 02/23/2018 07:28    Discharge Instructions    Amb Referral to Cardiac Rehabilitation   Complete by:  As directed    Diagnosis:  CABG   CABG X ___:  4      Discharge Medications: Allergies as of 02/25/2018   No Known Allergies     Medication List    STOP taking these medications   ADVIL ALLERGY SINUS  PO   atenolol 50 MG tablet Commonly known as:  TENORMIN   hydrochlorothiazide 25 MG tablet Commonly known as:  HYDRODIURIL   lovastatin 20 MG tablet Commonly known as:  MEVACOR Replaced by:  atorvastatin 80 MG tablet     TAKE these medications   acetaminophen 500 MG tablet Commonly known as:  TYLENOL Take 2 tablets (1,000 mg total) by mouth every 8 (eight) hours as needed for mild pain. What changed:  reasons to take this   aspirin 325 MG EC tablet Take 1 tablet (325 mg total) by mouth daily. What changed:    medication strength  how much to take   atorvastatin 80 MG tablet Commonly known as:  LIPITOR Take 1 tablet (80 mg total) by mouth daily at 6 PM. Replaces:  lovastatin 20 MG tablet   furosemide 40 MG tablet Commonly known as:  LASIX Take 1 tablet (40 mg total) by mouth daily. For 5 days then stop.   GLIPIZIDE XL 10 MG 24 hr tablet Generic drug:  glipiZIDE Take 10 mg by mouth at bedtime.   losartan 50 MG tablet Commonly known as:  COZAAR Take 1 tablet (50 mg total) by mouth at bedtime. What changed:    medication strength  how much to take   metFORMIN 500 MG tablet Commonly known as:  GLUCOPHAGE Take 750 mg by mouth 2 (two) times daily.   metoprolol tartrate 25 MG tablet Commonly known as:  LOPRESSOR Take 1 tablet (25 mg total) by mouth 2 (two) times daily.   multivitamin tablet Take 1 tablet by mouth daily.   OMEGA 3 PO Take 520 mg by mouth every other day.   oxyCODONE 5 MG immediate release tablet Commonly known as:  Oxy IR/ROXICODONE Take 5 mg by mouth every 4-6 hours PRN severe pain.   potassium chloride 10 MEQ tablet Commonly known as:  K-DUR Take 1 tablet (10 mEq total) by mouth daily. For 5 days then stop. What changed:  additional instructions   sodium chloride 0.65 % Soln nasal spray Commonly known as:  OCEAN Place 1 spray into both nostrils daily as needed for congestion.   VITAMIN B 12 PO Take 1,000 mcg by mouth 2 (two) times  a week. Saturday and Sunday   Vitamin D-3 1000 units Caps Take 1,000 Units by mouth daily.     The patient has been discharged on:   1.Beta Blocker:  Yes [  x ]                              No   [   ]                              If No, reason:  2.Ace Inhibitor/ARB: Yes [ x  ]                                     No  [    ]                                     If No, reason:  3.Statin:   Yes [  x ]                  No  [   ]                  If No, reason:  4.Ecasa:  Yes  [ x  ]                  No   [   ]                  If No, reason:  Follow Up Appointments: Follow-up Information    Loreli Slot, MD. Go on 03/26/2018.   Specialty:  Cardiothoracic Surgery Why:  PA/LAT CXR to be taken (at [facility] which is in the same building as Dr. Sunday Corn office) on 03/26/2018 at 2:30 pm;Appointment time is at 3:00 pm Contact information: [address] [place] [address] [phone]        Juliette Alcide, MD. Call.   Specialty:  Family Medicine Why:  for a follow up appointment within one month regarding further diabetes management and surveillance of HGA1C 7 Contact information: [address] [place] [address] [phone]  Iran Ouch, Lennart Pall, PA-C. Go on 03/19/2018.   Specialties:  Physician Assistant, Cardiology Why:  Appointment time is 3:30 pm Contact information: [address] [place] [address] [phone]           Signed: Lelon Huh [facility] 02/25/2018, 8:57 AM

## 2018-02-21 NOTE — Plan of Care (Signed)
Paitent sitting up in chair, denies any chest pain or shorness of breath.  Assisted back to bed with minimal assistance.  Steady on her feet.  States that she feels better now the chest tubes are out.  Rates pain 71/ with inspiration and increased movement.  Medicated as charted. Will continue to monitor as per protocol.

## 2018-02-21 NOTE — Discharge Instructions (Signed)
Coronary Artery Bypass Grafting, Care After °These instructions give you information on caring for yourself after your procedure. Your doctor may also give you more specific instructions. Call your doctor if you have any problems or questions after your procedure. °Follow these instructions at home: °· Only take medicine as told by your doctor. Take medicines exactly as told. Do not stop taking medicines or start any new medicines without talking to your doctor first. °· Take your pulse as told by your doctor. °· Do deep breathing as told by your doctor. Use your breathing device (incentive spirometer), if given, to practice deep breathing several times a day. Support your chest with a pillow or your arms when you take deep breaths or cough. °· Keep the area clean, dry, and protected where the surgery cuts (incisions) were made. Remove bandages (dressings) only as told by your doctor. If strips were applied to surgical area, do not take them off. They fall off on their own. °· Check the surgery area daily for puffiness (swelling), redness, or leaking fluid. °· If surgery cuts were made in your legs: °? Avoid crossing your legs. °? Avoid sitting for long periods of time. Change positions every 30 minutes. °? Raise your legs when you are sitting. Place them on pillows. °· Wear stockings that help keep blood clots from forming in your legs (compression stockings). °· Only take sponge baths until your doctor says it is okay to take showers. Pat the surgery area dry. Do not rub the surgery area with a washcloth or towel. Do not bathe, swim, or use a hot tub until your doctor says it is okay. °· Eat foods that are high in fiber. These include raw fruits and vegetables, whole grains, beans, and nuts. Choose lean meats. Avoid canned, processed, and fried foods. °· Drink enough fluids to keep your pee (urine) clear or pale yellow. °· Weigh yourself every day. °· Rest and limit activity as told by your doctor. You may be told  to: °? Stop any activity if you have chest pain, shortness of breath, changes in heartbeat, or dizziness. Get help right away if this happens. °? Move around often for short amounts of time or take short walks as told by your doctor. Gradually become more active. You may need help to strengthen your muscles and build endurance. °? Avoid lifting, pushing, or pulling anything heavier than 10 pounds (4.5 kg) for at least 6 weeks after surgery. °· Do not drive until your doctor says it is okay. °· Ask your doctor when you can go back to work. °· Ask your doctor when you can begin sexual activity again. °· Follow up with your doctor as told. °Contact a doctor if: °· You have puffiness, redness, more pain, or fluid draining from the incision site. °· You have a fever. °· You have puffiness in your ankles or legs. °· You have pain in your legs. °· You gain 2 or more pounds (0.9 kg) a day. °· You feel sick to your stomach (nauseous) or throw up (vomit). °· You have watery poop (diarrhea). °Get help right away if: °· You have chest pain that goes to your jaw or arms. °· You have shortness of breath. °· You have a fast or irregular heartbeat. °· You notice a "clicking" in your breastbone when you move. °· You have numbness or weakness in your arms or legs. °· You feel dizzy or light-headed. °This information is not intended to replace advice given to you by   your health care provider. Make sure you discuss any questions you have with your health care provider. °Document Released: 09/02/2013 Document Revised: 02/03/2016 Document Reviewed: 02/04/2013 °Elsevier Interactive Patient Education © 2017 [employer] ° °

## 2018-02-21 NOTE — Plan of Care (Signed)
  Problem: Education: Goal: Knowledge of General Education information will improve Outcome: Progressing   Problem: Clinical Measurements: Goal: Ability to maintain clinical measurements within normal limits will improve Outcome: Progressing Goal: Diagnostic test results will improve Outcome: Progressing Goal: Respiratory complications will improve Outcome: Progressing Goal: Cardiovascular complication will be avoided Outcome: Progressing   

## 2018-02-21 NOTE — Progress Notes (Signed)
Maintaining normal sinus rhythm 

## 2018-02-21 NOTE — Progress Notes (Signed)
CT surgery  Patient examined and record reviewed.Hemodynamics stable,labs satisfactory.Patient had stable day.Continue current care. Kathlee Nationseter Van Trigt III 02/21/2018

## 2018-02-21 NOTE — Plan of Care (Signed)
Patient alert and oriented, offers no complaints at this time.  Tolerated dangling at the side of bed, minimal amount dumped from chest tube.  Encouraged to utilize ISP.  Can achieve up to 650 at this time.  Medicated for pain, will continue to follow up.  Nitro gtt titrated off.Monitoring as per protocol.

## 2018-02-22 ENCOUNTER — Inpatient Hospital Stay (HOSPITAL_COMMUNITY): Payer: BC Managed Care – PPO

## 2018-02-22 LAB — GLUCOSE, CAPILLARY
Glucose-Capillary: 142 mg/dL — ABNORMAL HIGH (ref 65–99)
Glucose-Capillary: 85 mg/dL (ref 65–99)
Glucose-Capillary: 126 mg/dL — ABNORMAL HIGH (ref 65–99)
Glucose-Capillary: 93 mg/dL (ref 65–99)

## 2018-02-22 LAB — BASIC METABOLIC PANEL
Anion gap: 8 (ref 5–15)
BUN: 17 mg/dL (ref 6–20)
CHLORIDE: 102 mmol/L (ref 101–111)
CO2: 24 mmol/L (ref 22–32)
CREATININE: 1.02 mg/dL — AB (ref 0.44–1.00)
Calcium: 8 mg/dL — ABNORMAL LOW (ref 8.9–10.3)
GFR, EST NON AFRICAN AMERICAN: 59 mL/min — AB (ref >60)
Glucose, Bld: 148 mg/dL — ABNORMAL HIGH (ref 65–99)
POTASSIUM: 4.3 mmol/L (ref 3.5–5.1)
Sodium: 134 mmol/L — ABNORMAL LOW (ref 135–145)

## 2018-02-22 LAB — CBC
HEMATOCRIT: 26.8 % — AB (ref 36.0–46.0)
Hemoglobin: 8.7 g/dL — ABNORMAL LOW (ref 12.0–15.0)
MCH: 29.1 pg (ref 26.0–34.0)
MCHC: 32.5 g/dL (ref 30.0–36.0)
MCV: 89.6 fL (ref 78.0–100.0)
PLATELETS: 149 10*3/uL — AB (ref 150–400)
RBC: 2.99 MIL/uL — AB (ref 3.87–5.11)
RDW: 14.9 % (ref 11.5–15.5)
WBC: 9.2 10*3/uL (ref 4.0–10.5)

## 2018-02-22 MED ORDER — FUROSEMIDE 40 MG PO TABS
40.0000 mg | ORAL_TABLET | Freq: Every day | ORAL | Status: DC
  Administered 2018-02-22 – 2018-02-25 (×4): 40 mg via ORAL
  Filled 2018-02-22 (×4): qty 1

## 2018-02-22 MED ORDER — INSULIN ASPART 100 UNIT/ML ~~LOC~~ SOLN: 0.0000 [IU] | Freq: Every day | SUBCUTANEOUS | Status: DC

## 2018-02-22 MED ORDER — INSULIN DETEMIR 100 UNIT/ML ~~LOC~~ SOLN
20.0000 [IU] | Freq: Two times a day (BID) | SUBCUTANEOUS | Status: DC
  Administered 2018-02-22: 20 [IU] via SUBCUTANEOUS
  Filled 2018-02-22 (×2): qty 0.2

## 2018-02-22 MED ORDER — METOPROLOL TARTRATE 25 MG PO TABS
25.0000 mg | ORAL_TABLET | Freq: Two times a day (BID) | ORAL | Status: DC
  Administered 2018-02-22 – 2018-02-25 (×7): 25 mg via ORAL
  Filled 2018-02-22 (×7): qty 1

## 2018-02-22 MED ORDER — ALUM & MAG HYDROXIDE-SIMETH 200-200-20 MG/5ML PO SUSP: 15.0000 mL | Freq: Four times a day (QID) | ORAL | Status: DC | PRN

## 2018-02-22 MED ORDER — METOPROLOL TARTRATE 25 MG/10 ML ORAL SUSPENSION: 25.0000 mg | Freq: Two times a day (BID) | ORAL | Status: DC

## 2018-02-22 MED ORDER — SODIUM CHLORIDE 0.9% FLUSH: 3.0000 mL | INTRAVENOUS | Status: DC | PRN

## 2018-02-22 MED ORDER — MAGNESIUM HYDROXIDE 400 MG/5ML PO SUSP: 30.0000 mL | Freq: Every day | ORAL | Status: DC | PRN

## 2018-02-22 MED ORDER — MOVING RIGHT ALONG BOOK
Freq: Once | Status: DC
  Filled 2018-02-22 (×2): qty 1

## 2018-02-22 MED ORDER — INSULIN ASPART 100 UNIT/ML ~~LOC~~ SOLN
0.0000 [IU] | Freq: Three times a day (TID) | SUBCUTANEOUS | Status: DC
  Administered 2018-02-22 (×2): 2 [IU] via SUBCUTANEOUS

## 2018-02-22 MED ORDER — SODIUM CHLORIDE 0.9% FLUSH
3.0000 mL | Freq: Two times a day (BID) | INTRAVENOUS | Status: DC
  Administered 2018-02-22 – 2018-02-25 (×6): 3 mL via INTRAVENOUS

## 2018-02-22 MED ORDER — ZOLPIDEM TARTRATE 5 MG PO TABS: 5.0000 mg | ORAL_TABLET | Freq: Every evening | ORAL | Status: DC | PRN

## 2018-02-22 MED ORDER — SODIUM CHLORIDE 0.9 % IV SOLN: 250.0000 mL | INTRAVENOUS | Status: DC | PRN

## 2018-02-22 MED ORDER — POTASSIUM CHLORIDE CRYS ER 10 MEQ PO TBCR
20.0000 meq | EXTENDED_RELEASE_TABLET | Freq: Every day | ORAL | Status: DC
  Administered 2018-02-22 – 2018-02-24 (×3): 20 meq via ORAL
  Filled 2018-02-22 (×5): qty 2

## 2018-02-22 MED FILL — Sodium Chloride IV Soln 0.9%: INTRAVENOUS | Qty: 2000 | Status: AC

## 2018-02-22 MED FILL — Lidocaine HCl Local Soln Prefilled Syringe 100 MG/5ML (2%): INTRAMUSCULAR | Qty: 5 | Status: AC

## 2018-02-22 MED FILL — Mannitol IV Soln 20%: INTRAVENOUS | Qty: 500 | Status: AC

## 2018-02-22 MED FILL — Electrolyte-R (PH 7.4) Solution: INTRAVENOUS | Qty: 3000 | Status: AC

## 2018-02-22 MED FILL — Heparin Sodium (Porcine) Inj 1000 Unit/ML: INTRAMUSCULAR | Qty: 20 | Status: AC

## 2018-02-22 NOTE — Progress Notes (Signed)
      [address]       [place] [id number]             [phone]      Up in chair  BP 109/77   Pulse 75   Temp 98.3 F (36.8 C) (Oral)   Resp 16   Ht 5\' 4"  (1.626 m)   Wt 220 lb 7.4 oz (100 kg)   SpO2 98%   BMI 37.84 kg/m   Intake/Output Summary (Last 24 hours) at 02/22/2018 1709 Last data filed at 02/22/2018 1600 Gross per 24 hour  Intake 1269.17 ml  Output 1470 ml  Net -200.83 ml   CBG better- will dc levemir transfer orders written  Viviann SpareSteven C. Dorris FetchHendrickson, MD [facility] [phone]

## 2018-02-22 NOTE — Plan of Care (Signed)
  Problem: Education: Goal: Knowledge of General Education information will improve Outcome: Progressing   Problem: Health Behavior/Discharge Planning: Goal: Ability to manage health-related needs will improve Outcome: Progressing   Problem: Clinical Measurements: Goal: Ability to maintain clinical measurements within normal limits will improve Outcome: Progressing   

## 2018-02-22 NOTE — Op Note (Signed)
NAME: Leslie Cobb, [name] MEDICAL RECORD [id number] ACCOUNT [id number] DATE OF BIRTH:09/30/1955 FACILITY: [facility] LOCATION: [facility] PHYSICIAN:Hadessah Grennan Lars Pinks. Reina Wilton, MD  OPERATIVE REPORT  DATE OF PROCEDURE:  02/20/2018  PREOPERATIVE DIAGNOSIS:  Three-vessel coronary disease with progressive angina.  POSTOPERATIVE DIAGNOSIS:   Three-vessel coronary disease with progressive angina.  PROCEDURES:  Median sternotomy, extracorporeal circulation, Coronary artery bypass grafting x 4  Left internal mammary artery to left anterior descending,  Sequential saphenous vein graft to ramus intermedius 1 and 2,  Saphenous vein graft to  Posterolateral Endoscopic vein harvest right leg.  SURGEON:   Salvatore DecentSteven C. Dorris FetchHendrickson, MD  ASSISTANT:   Ponciano Ortanielle Zimmerman, PA-C  ANESTHESIA:  General.  FINDINGS:  Transesophageal echocardiography showed preserved left ventricular function with no significant valvular pathology.  Good quality targets, left mammary good quality; saphenous vein fair quality.  OM too small to graft.  CLINICAL NOTE:  The patient is a 62 year old woman with multiple cardiac risk factors who presented with progressive exertional chest tightness and shortness of breath.  An echocardiogram showed preserved left ventricular function.  A Lexiscan Myoview  showed nonspecific ST changes and an anterolateral reversible defect.  At catheterization, she had severe 3-vessel coronary disease.  She was referred for coronary artery bypass grafting.  The indications, risks, benefits, and alternatives were discussed  in detail with the patient.  She understood and accepted the risks and agreed to proceed.  DESCRIPTION OF PROCEDURE:  The patient was brought to the preoperative holding area on 02/20/2018.  Anesthesia placed a Swan-Ganz catheter and an arterial blood pressure monitoring line. She was taken to the operating room and anesthetized and intubated.   A Foley catheter was placed.  Intravenous  antibiotics were administered.  The chest, abdomen and legs were prepped and draped in the usual sterile fashion.  After performing a timeout, an incision was made in the medial aspect of the right leg at the  level of the knee. The greater saphenous vein was harvested from the upper calf to the groin endoscopically.  The saphenous vein was of fair quality.  It was satisfactory for use as a bypass graft.  Simultaneously, a median sternotomy was performed and the left  internal mammary artery was harvested using standard technique.  The mammary artery was a good quality vessel with excellent flow and divided distally.  Five thousand units of heparin was administered during the vessel harvest.  The remainder of the full  heparin dose was given prior to opening the pericardium.  After harvesting the conduits, the pericardium was opened.  The ascending aorta was inspected.  After confirming adequate anticoagulation with ACT measurement, the aorta was cannulated via concentric 2-0 Ethibond pledgeted pursestring sutures.  A dual  stage venous cannula was placed via a pursestring suture in the right atrial appendage.  Cardiopulmonary bypass was initiated.  Flows were maintained per protocol.  The patient was cooled to 32 degrees Celsius.  The coronary arteries were inspected and  anastomotic sites were chosen.  The obtuse marginal branch of the left circumflex was a very small tortuous vessel where it could be identified on the posterolateral wall.  It was not suitable for grafting.  The remaining targets were good quality.  The  conduits were inspected and cut to length.  A foam pad was placed in the pericardium to insulate the heart.  A temperature probe was placed in the myocardial septum and a cardioplegia cannula was placed in the ascending aorta.  The aorta was cross clamped.  The left ventricle  was then emptied via the aortic root vent.  Cardiac arrest was achieved with a combination of cold antegrade  blood cardioplegia and topical iced saline.  After achieving a complete diastolic arrest and  adequate septal cooling to 10 degrees Celsius, the following distal anastomoses were performed:  A reversed saphenous vein graft was placed end-to-side to the posterolateral branch of the right coronary.  This was a 1.5 mm thin walled, but otherwise good quality target.  The vein was of fair quality.  The end-to-side anastomosis was performed with a  running 7-0 Prolene suture.  All anastomoses were probed proximally and distally at their completion before tying the suture.  Cardioplegia was administered down the graft. Flow and hemostasis were both good.  Next, a reversed saphenous vein graft was placed sequentially to the first and second branches of the ramus intermedius.  The ramus was a dominant anterolateral branch that bifurcated giving off a first branch that was more of a diagonal  distribution and then a lateral branch, which was more of an OM distribution.  Both of these were good quality targets at the site of the anastomosis.  A side-to-side anastomosis was performed to the first branch and an end-to-side to the second.  Both  were done with running 7-0 Prolene sutures.  A probe was passed easily proximally and distally in both anastomoses.  Cardioplegia was administered down the graft and there was good flow and good hemostasis.  Additional cardioplegia was administered down the aortic root.  The left internal mammary artery then was brought through a window in the pericardium.  The distal limb was bevelled.  It was anastomosed end-to-side to the LAD.  The LAD was a 2 mm good  quality target at the site of the anastomosis.  The mammary was a 2 mm good quality conduit.  The end-to-side anastomosis was performed with a running 8-0 Prolene suture.  After completion of the anastomosis, the bulldog clamp was briefly removed and  inspected for hemostasis.  Rapid septal rewarming was noted.  The bulldog  clamp was replaced.  The mammary pedicle was tacked to the epicardial surface of the heart with 6-0 Prolene sutures.  Additional cardioplegia was administered.  The vein grafts were cut to length.  The proximal vein graft anastomoses were performed to 4.0 mm punch aortotomies with running 6-0 Prolene sutures.  At the completion of the final proximal anastomosis, the  patient was placed in Trendelenburg position.  Lidocaine was administered.  The bulldog clamp was removed from the left mammary artery.  The left ventricle and aortic root were deaired and the aortic crossclamp was removed.  The total crossclamp time was  66 minutes.  The patient spontaneously resumed sinus rhythm and did not require defibrillation.  While rewarming was completed, all proximal and distal anastomoses were inspected for hemostasis.  Epicardial pacing wires were placed on the right ventricle and right  atrium.  When the patient had rewarmed to a core temperature of 37 degrees Celsius.  She was weaned from cardiopulmonary bypass on the first attempt.  The total bypass time was 97 minutes.  She did not require inotropic support.  Post-bypass  transesophageal echocardiography showed good left ventricular function with no significant valvular pathology.  The initial cardiac index was greater than 2 liters per minute peter meter squared.  A test dose of protamine was administered and was well tolerated.  The atrial and aortic cannula were removed.  The remainder of the protamine was administered without  incident.  The chest was irrigated with warm saline.  Hemostasis was achieved.  The pericardium was reapproximated over the heart with interrupted 3-0 silk sutures.  It came together without tension.  Left pleural and mediastinal chest tubes were placed  through separate subcostal incisions.  The sternum was closed with a combination of single and double heavy gauge stainless steel wires after closure of the sternum, there was a  transient drop in cardiac output which responded to volume administration.   There were no other changes noted.  The pectoralis fascia, subcutaneous tissue and skin were closed in standard fashion.  The leg incisions were closed in standard fashion.    She was taken from the operating room to the Cardiac Intensive Care Unit, intubated and in good condition.  AN/NUANCE  D:02/21/2018 T:02/22/2018 JOB:[id number]

## 2018-02-22 NOTE — Progress Notes (Signed)
Cybill RN will remove central line. Consuello Masseimmons, Domenica Weightman M

## 2018-02-22 NOTE — Progress Notes (Signed)
2 Days Post-Op Procedure(s) (LRB): CORONARY ARTERY BYPASS GRAFTING (CABG) x 4 (LIMA to LAD, SVG SEQUENTIALLY to RAMUS 1 and 2, SVG to PLB) with EVH from RIGHT GREATER SAPHENOUS VEIN and LEFT INTERNAL MAMMARY ARTERY HARVEST (N/A) TRANSESOPHAGEAL ECHOCARDIOGRAM (TEE) (N/A) Subjective: Some incisional pain  Objective: Vital signs in last 24 hours: Temp:  [98.3 F (36.8 C)-99 F (37.2 C)] 98.6 F (37 C) (06/13 2000) Pulse Rate:  [66-85] 81 (06/14 0700) Cardiac Rhythm: Normal sinus rhythm (06/13 1941) Resp:  [12-25] 15 (06/14 0700) BP: (107-139)/(52-68) 135/52 (06/14 0700) SpO2:  [90 %-98 %] 95 % (06/14 0700) Arterial Line BP: (127-152)/(48-60) 127/48 (06/13 1200) Weight:  [220 lb 7.4 oz (100 kg)] 220 lb 7.4 oz (100 kg) (06/14 0500)  Hemodynamic parameters for last 24 hours: PAP: (34-36)/(15-20) 34/15 CO:  [5.4 L/min] 5.4 L/min CI:  [2.7 L/min/m2] 2.7 L/min/m2  Intake/Output from previous day: 06/13 0701 - 06/14 0700 In: 2332.5 [P.O.:1320; I.V.:730; IV Piggyback:282.5] Out: 1415 [Urine:1345; Chest Tube:70] Intake/Output this shift: No intake/output data recorded.  General appearance: alert, cooperative and no distress Neurologic: intact Heart: regular rate and rhythm Lungs: diminished breath sounds bibasilar and L>R Abdomen: normal findings: soft, non-tender  Lab Results: Recent Labs    02/21/18 1729 02/21/18 1735 02/22/18 0405  WBC 10.3  --  9.2  HGB 9.1* 8.8* 8.7*  HCT 28.1* 26.0* 26.8*  PLT 169  --  149*   BMET:  Recent Labs    02/21/18 0427  02/21/18 1735 02/22/18 0405  NA 140  --  134* 134*  K 4.3  --  3.7 4.3  CL 106  --  97* 102  CO2 25  --   --  24  GLUCOSE 101*  --  266* 148*  BUN 11  --  14 17  CREATININE 0.88   < > 1.00 1.02*  CALCIUM 7.9*  --   --  8.0*   < > = values in this interval not displayed.    PT/INR:  Recent Labs    02/20/18 1359  LABPROT 16.9*  INR 1.38   ABG    Component Value Date/Time   PHART 7.340 (L) 02/20/2018 2008   HCO3 23.7 02/20/2018 2008   TCO2 21 (L) 02/21/2018 1735   ACIDBASEDEF 2.0 02/20/2018 2008   O2SAT 97.0 02/20/2018 2008   CBG (last 3)  Recent Labs    02/21/18 1621 02/21/18 1950 02/21/18 2337  GLUCAP 235* 204* 84    Assessment/Plan: S/P Procedure(s) (LRB): CORONARY ARTERY BYPASS GRAFTING (CABG) x 4 (LIMA to LAD, SVG SEQUENTIALLY to RAMUS 1 and 2, SVG to PLB) with EVH from RIGHT GREATER SAPHENOUS VEIN and LEFT INTERNAL MAMMARY ARTERY HARVEST (N/A) TRANSESOPHAGEAL ECHOCARDIOGRAM (TEE) (N/A) Plan for transfer to step-down: see transfer orders  CV- stable in SR- continue ASA, statin, beta blocker  RESP- continue IS for atelectasis  RENAL- still volume overloaded, creatinine and lytes OK  Diurese with PO lasix  Dc Foley  ENDO- CBG elevated last night, then low this AM  Decrease lantus, continue oral meds, CBG/ SSI AC and HS  Anemia secondary to ABL_ mild, follow  SCD + enoxaparin for DVT prophylaxis   LOS: 4 days    Leslie SlotSteven C Rikayla Demmon 02/22/2018

## 2018-02-22 NOTE — Progress Notes (Signed)
Patient arrived from 2H to 4E room 4 post-op day 2 from CABG x4.  Telemetry monitor applied and CCMD notified.  Patient oriented to unit and room to include call light and phone.  Will continue to monitor.

## 2018-02-22 NOTE — Care Management Note (Signed)
Case Management Note Donn PieriniKristi Jahnia Hewes RN,BSN Unit [facility]2H 1-22 Case Manager  [phone]  Patient Details  Name: Leslie Cobb MRN: [id number] Date of Birth: 1956-08-01  Subjective/Objective:  Pt admitted with abnormal stress- s/p CABGx4 on 02/20/18                 Action/Plan: PTA pt lived at home, independent- CM to follow for transition of care needs  Expected Discharge Date:                  Expected Discharge Plan:  Home/Self Care  In-House Referral:     Discharge planning Services  CM Consult  Post Acute Care Choice:    Choice offered to:     DME Arranged:    DME Agency:     HH Arranged:    HH Agency:     Status of Service:  In process, will continue to follow  If discussed at Long Length of Stay Meetings, dates discussed:    Additional Comments:  Leslie Cobb, Leslie Zywicki Hall, RN 02/22/2018, 4:16 PM

## 2018-02-23 ENCOUNTER — Inpatient Hospital Stay (HOSPITAL_COMMUNITY): Payer: BC Managed Care – PPO

## 2018-02-23 LAB — GLUCOSE, CAPILLARY
GLUCOSE-CAPILLARY: 83 mg/dL (ref 65–99)
GLUCOSE-CAPILLARY: 113 mg/dL — AB (ref 65–99)
GLUCOSE-CAPILLARY: 110 mg/dL — AB (ref 65–99)
Glucose-Capillary: 70 mg/dL (ref 65–99)

## 2018-02-23 LAB — BPAM RBC
BLOOD PRODUCT EXPIRATION DATE: 201907112359
Blood Product Expiration Date: 201907102359
Blood Product Expiration Date: 201907112359
Blood Product Expiration Date: 201907112359
ISSUE DATE / TIME: 201906121614
ISSUE DATE / TIME: 201906121625
UNIT TYPE AND RH: 5100
Unit Type and Rh: 5100
Unit Type and Rh: 5100
Unit Type and Rh: 5100

## 2018-02-23 LAB — CBC
HCT: 25.4 % — ABNORMAL LOW (ref 36.0–46.0)
Hemoglobin: 8.2 g/dL — ABNORMAL LOW (ref 12.0–15.0)
MCH: 28.9 pg (ref 26.0–34.0)
MCHC: 32.3 g/dL (ref 30.0–36.0)
MCV: 89.4 fL (ref 78.0–100.0)
PLATELETS: 159 10*3/uL (ref 150–400)
RBC: 2.84 MIL/uL — ABNORMAL LOW (ref 3.87–5.11)
RDW: 14.6 % (ref 11.5–15.5)
WBC: 8.7 10*3/uL (ref 4.0–10.5)

## 2018-02-23 LAB — TYPE AND SCREEN
ABO/RH(D): O POS
Antibody Screen: NEGATIVE
UNIT DIVISION: 0
Unit division: 0
Unit division: 0
Unit division: 0

## 2018-02-23 LAB — BASIC METABOLIC PANEL
Anion gap: 8 (ref 5–15)
BUN: 19 mg/dL (ref 6–20)
CALCIUM: 8 mg/dL — AB (ref 8.9–10.3)
CHLORIDE: 100 mmol/L — AB (ref 101–111)
CO2: 25 mmol/L (ref 22–32)
CREATININE: 0.87 mg/dL (ref 0.44–1.00)
GFR calc Af Amer: >60 mL/min (ref >60)
Glucose, Bld: 105 mg/dL — ABNORMAL HIGH (ref 65–99)
Potassium: 4.4 mmol/L (ref 3.5–5.1)
SODIUM: 133 mmol/L — AB (ref 135–145)

## 2018-02-23 NOTE — Progress Notes (Signed)
EPW removed, tips intact. PT educated on bedrest for 1 hour. VSS. Call light in reach. Will continue to monitor.  Versie StarksHanna  Tanesha Arambula, RN

## 2018-02-23 NOTE — Progress Notes (Signed)
CARDIAC REHAB PHASE I   PRE:  Rate/Rhythm: 86  BP:  Sitting: 146/68     SaO2: 97ra  MODE:  Ambulation: 344 ft   POST:  Rate/Rhythm: 87  BP:  Sitting: 122/60     SaO2: 95ra  10:23a-11:05a Patient ambulated with walker needing no rest breaks. No complaints. Education completed. [facility].   Barnabas ListerMolly M Sadye Kiernan, MS 02/23/2018 11:02 AM

## 2018-02-23 NOTE — Progress Notes (Addendum)
      [address]       [place] [id number]             [phone]      3 Days Post-Op Procedure(s) (LRB): CORONARY ARTERY BYPASS GRAFTING (CABG) x 4 (LIMA to LAD, SVG SEQUENTIALLY to RAMUS 1 and 2, SVG to PLB) with EVH from RIGHT GREATER SAPHENOUS VEIN and LEFT INTERNAL MAMMARY ARTERY HARVEST (N/A) TRANSESOPHAGEAL ECHOCARDIOGRAM (TEE) (N/A) Subjective: Feels good today but very tired. Slept okay last night.   Objective: Vital signs in last 24 hours: Temp:  [98.1 F (36.7 C)-98.5 F (36.9 C)] 98.2 F (36.8 C) (06/15 0535) Pulse Rate:  [75-82] 77 (06/15 0535) Cardiac Rhythm: Normal sinus rhythm (06/15 0700) Resp:  [14-22] 17 (06/15 0535) BP: (109-142)/(56-77) 132/72 (06/15 0535) SpO2:  [91 %-98 %] 98 % (06/15 0535) Weight:  [98.9 kg (218 lb 1.6 oz)] 98.9 kg (218 lb 1.6 oz) (06/15 0535)     Intake/Output from previous day: 06/14 0701 - 06/15 0700 In: 660 [P.O.:600; I.V.:60] Out: 2060 [Urine:2060] Intake/Output this shift: No intake/output data recorded.  General appearance: alert, cooperative and no distress Heart: regular rate and rhythm, S1, S2 normal, no murmur, click, rub or gallop Lungs: clear to auscultation bilaterally Abdomen: soft, non-tender; bowel sounds normal; no masses,  no organomegaly Extremities: 1-2+ non pitting pedal edema Wound: clean and dry  Lab Results: Recent Labs    02/22/18 0405 02/23/18 0134  WBC 9.2 8.7  HGB 8.7* 8.2*  HCT 26.8* 25.4*  PLT 149* 159   BMET:  Recent Labs    02/22/18 0405 02/23/18 0134  NA 134* 133*  K 4.3 4.4  CL 102 100*  CO2 24 25  GLUCOSE 148* 105*  BUN 17 19  CREATININE 1.02* 0.87  CALCIUM 8.0* 8.0*    PT/INR:  Recent Labs    02/20/18 1359  LABPROT 16.9*  INR 1.38   ABG    Component Value Date/Time   PHART 7.340 (L) 02/20/2018 2008   HCO3 23.7 02/20/2018 2008   TCO2 21 (L) 02/21/2018 1735   ACIDBASEDEF 2.0 02/20/2018 2008   O2SAT 97.0 02/20/2018 2008   CBG (last 3)    Recent Labs    02/22/18 1622 02/22/18 2134 02/23/18 0637  GLUCAP 126* 93 83    Assessment/Plan: S/P Procedure(s) (LRB): CORONARY ARTERY BYPASS GRAFTING (CABG) x 4 (LIMA to LAD, SVG SEQUENTIALLY to RAMUS 1 and 2, SVG to PLB) with EVH from RIGHT GREATER SAPHENOUS VEIN and LEFT INTERNAL MAMMARY ARTERY HARVEST (N/A) TRANSESOPHAGEAL ECHOCARDIOGRAM (TEE) (N/A)  1. CV- NSR in the 70s. BP well controlled. Tolerating ASA, statin, and lopressor. Continue 2. Pulm-tolerating room air with good oxygen saturation. CXR with left base atelectasis or infiltrate, improving since prior study.  3. Renal-creatinine 0.87, electrolytes okay. Weight is trending down. Continue daily lasix for fluid overload. Making good urine 4. Endo-on Metformin, Glipizide, and SSI. Blood glucose has been well controlled.  5. H and H is stable.  Platelets trending up  Plan: Continue to wean oxygen as tolerated. Her rhythm has been stable so okay to remove EPW today.    LOS: 5 days    Leslie Cobb 02/23/2018 Patient seen and examined, agree with above CXR shows improving LLL atelectasis  Leslie DecentSteven C. Dorris FetchHendrickson, MD [facility] [phone]

## 2018-02-24 LAB — GLUCOSE, CAPILLARY
GLUCOSE-CAPILLARY: 91 mg/dL (ref 65–99)
GLUCOSE-CAPILLARY: 93 mg/dL (ref 65–99)
Glucose-Capillary: 91 mg/dL (ref 65–99)
Glucose-Capillary: 94 mg/dL (ref 65–99)
Glucose-Capillary: 103 mg/dL — ABNORMAL HIGH (ref 65–99)

## 2018-02-24 NOTE — Progress Notes (Addendum)
      [address]       [place] [id number]             [phone]      4 Days Post-Op Procedure(s) (LRB): CORONARY ARTERY BYPASS GRAFTING (CABG) x 4 (LIMA to LAD, SVG SEQUENTIALLY to RAMUS 1 and 2, SVG to PLB) with EVH from RIGHT GREATER SAPHENOUS VEIN and LEFT INTERNAL MAMMARY ARTERY HARVEST (N/A) TRANSESOPHAGEAL ECHOCARDIOGRAM (TEE) (N/A) Subjective: She feels pretty good this morning. Still gets tired easily. She is sleeping better through the night.  Objective: Vital signs in last 24 hours: Temp:  [98.1 F (36.7 C)-99.5 F (37.5 C)] 98.1 F (36.7 C) (06/16 0411) Pulse Rate:  [79-102] 80 (06/16 0411) Cardiac Rhythm: Other (Comment) (06/15 2000) Resp:  [17-20] 20 (06/16 0411) BP: (121-133)/(61-66) 121/65 (06/16 0411) SpO2:  [94 %-98 %] 98 % (06/16 0411) Weight:  [96.8 kg (213 lb 8 oz)] 96.8 kg (213 lb 8 oz) (06/16 0411)     Intake/Output from previous day: 06/15 0701 - 06/16 0700 In: 720 [P.O.:720] Out: 3001 [Urine:3000; Stool:1] Intake/Output this shift: No intake/output data recorded.  General appearance: alert, cooperative and no distress Heart: regular rate and rhythm, S1, S2 normal, no murmur, click, rub or gallop Lungs: clear to auscultation bilaterally Abdomen: soft, non-tender; bowel sounds normal; no masses,  no organomegaly Extremities: 1-2+ nonpitting edema in lower extremity Wound: clean and dry  Lab Results: Recent Labs    02/22/18 0405 02/23/18 0134  WBC 9.2 8.7  HGB 8.7* 8.2*  HCT 26.8* 25.4*  PLT 149* 159   BMET:  Recent Labs    02/22/18 0405 02/23/18 0134  NA 134* 133*  K 4.3 4.4  CL 102 100*  CO2 24 25  GLUCOSE 148* 105*  BUN 17 19  CREATININE 1.02* 0.87  CALCIUM 8.0* 8.0*    PT/INR: No results for input(s): LABPROT, INR in the last 72 hours. ABG    Component Value Date/Time   PHART 7.340 (L) 02/20/2018 2008   HCO3 23.7 02/20/2018 2008   TCO2 21 (L) 02/21/2018 1735   ACIDBASEDEF 2.0 02/20/2018 2008   O2SAT 97.0 02/20/2018 2008   CBG (last 3)  Recent Labs    02/23/18 1602 02/23/18 2059 02/24/18 0601  GLUCAP 70 110* 91    Assessment/Plan: S/P Procedure(s) (LRB): CORONARY ARTERY BYPASS GRAFTING (CABG) x 4 (LIMA to LAD, SVG SEQUENTIALLY to RAMUS 1 and 2, SVG to PLB) with EVH from RIGHT GREATER SAPHENOUS VEIN and LEFT INTERNAL MAMMARY ARTERY HARVEST (N/A) TRANSESOPHAGEAL ECHOCARDIOGRAM (TEE) (N/A)  1. CV- NSR in the 80s. BP well controlled. Tolerating ASA, statin, and lopressor. Continue 2. Pulm-tolerating room air with good oxygen saturation. CXR with left base atelectasis. Continue incentive spirometer.   3. Renal-creatinine 0.87, electrolytes okay. Weight is trending down. Still about 6 lbs over baseline. Continue daily lasix for fluid overload. Making good urine 4. Endo-on Metformin, Glipizide, and SSI. Blood glucose has been well controlled.  5. H and H is stable.  Platelets trending up  Plan: Continue to diuresis. Work on Facilities managerincentive spirometer. Continue to ambulate in the halls. Possibly home tomorrow.    LOS: 6 days    Leslie Cobb 02/24/2018 Patient seen and examined, agree with above Looks much better today Home in AM if continues to progress  Viviann SpareSteven C. Dorris FetchHendrickson, MD [facility] [phone]

## 2018-02-25 LAB — GLUCOSE, CAPILLARY
GLUCOSE-CAPILLARY: 94 mg/dL (ref 65–99)
GLUCOSE-CAPILLARY: 87 mg/dL (ref 65–99)

## 2018-02-25 MED ORDER — ACETAMINOPHEN 500 MG PO TABS: 1000.0000 mg | ORAL_TABLET | Freq: Three times a day (TID) | ORAL | 0 refills | Status: AC | PRN

## 2018-02-25 MED ORDER — ASPIRIN 325 MG PO TBEC: 325.0000 mg | DELAYED_RELEASE_TABLET | Freq: Every day | ORAL | 0 refills | Status: DC

## 2018-02-25 MED ORDER — POTASSIUM CHLORIDE ER 10 MEQ PO TBCR: 10.0000 meq | EXTENDED_RELEASE_TABLET | Freq: Every day | ORAL | 0 refills | Status: DC

## 2018-02-25 MED ORDER — LOSARTAN POTASSIUM 50 MG PO TABS
50.0000 mg | ORAL_TABLET | Freq: Every day | ORAL | Status: DC
  Administered 2018-02-25: 50 mg via ORAL
  Filled 2018-02-25: qty 1

## 2018-02-25 MED ORDER — FUROSEMIDE 40 MG PO TABS: 40.0000 mg | ORAL_TABLET | Freq: Every day | ORAL | 0 refills | Status: DC

## 2018-02-25 MED ORDER — METOPROLOL TARTRATE 25 MG PO TABS: 25.0000 mg | ORAL_TABLET | Freq: Two times a day (BID) | ORAL | 1 refills | Status: DC

## 2018-02-25 MED ORDER — LOSARTAN POTASSIUM 50 MG PO TABS: 50.0000 mg | ORAL_TABLET | Freq: Every day | ORAL | 1 refills | Status: DC

## 2018-02-25 MED ORDER — ATORVASTATIN CALCIUM 80 MG PO TABS: 80.0000 mg | ORAL_TABLET | Freq: Every day | ORAL | 1 refills | Status: AC

## 2018-02-25 MED ORDER — LOSARTAN POTASSIUM 25 MG PO TABS: 25.0000 mg | ORAL_TABLET | Freq: Every day | ORAL | Status: DC

## 2018-02-25 MED ORDER — OXYCODONE HCL 5 MG PO TABS: ORAL_TABLET | ORAL | 0 refills | Status: DC

## 2018-02-25 NOTE — Progress Notes (Signed)
Chest tube sutures removed per order. 

## 2018-02-25 NOTE — Progress Notes (Signed)
Mazikeen Norva PavlovElizabeth Hulsey to be D/C'd Home per MD order. Discussed with the patient and all questions fully answered.    IV catheter discontinued intact. Site without signs and symptoms of complications. Dressing and pressure applied.  An After Visit Summary was printed and given to the patient.  Patient escorted via WC, and D/C home via private auto.  Kai LevinsJacobs, Marai Teehan N  02/25/2018 4:09 PM

## 2018-02-25 NOTE — Progress Notes (Signed)
Progress Note  [name] Date of Encounter: 02/25/2018  Primary Cardiologist: Nona Dell, MD   Subjective   No chest pain or dyspnea   Inpatient Medications    Scheduled Meds: . acetaminophen  1,000 mg Oral Q6H   Or  . acetaminophen (TYLENOL) oral liquid 160 mg/5 mL  1,000 mg Per Tube Q6H  . aspirin EC  325 mg Oral Daily   Or  . aspirin  324 mg Per Tube Daily  . atorvastatin  80 mg Oral q1800  . bisacodyl  10 mg Oral Daily   Or  . bisacodyl  10 mg Rectal Daily  . cholecalciferol  1,000 Units Oral Daily  . docusate sodium  200 mg Oral Daily  . enoxaparin (LOVENOX) injection  40 mg Subcutaneous QHS  . furosemide  40 mg Oral Daily  . glipiZIDE  10 mg Oral QHS  . insulin aspart  0-15 Units Subcutaneous TID WC  . insulin aspart  0-5 Units Subcutaneous QHS  . losartan  50 mg Oral Daily  . metFORMIN  750 mg Oral BID PC  . metoprolol tartrate  25 mg Oral BID  . moving right along book   Does not apply Once  . multivitamin with minerals  1 tablet Oral Daily  . omega-3 acid ethyl esters  1 g Oral QODAY  . pantoprazole  40 mg Oral Daily  . sodium chloride flush  3 mL Intravenous Q12H  . vitamin B-12  1,000 mcg Oral Once per day on Mon Thu   Continuous Infusions: . sodium chloride     PRN Meds: sodium chloride, alum & mag hydroxide-simeth, magnesium hydroxide, metoprolol tartrate, ondansetron (ZOFRAN) IV, oxyCODONE, sodium chloride flush, traMADol, zolpidem   Vital Signs    Vitals:   02/24/18 1555 02/24/18 1938 02/25/18 0304 02/25/18 0736  BP: (!) 166/77 (!) 150/75 (!) 162/66 (!) 153/77  Pulse: 90 100 96 89  Resp: (!) 22 16 19 18   Temp: 98.1 F (36.7 C) 98.7 F (37.1 C) 97.9 F (36.6 C) 98.6 F (37 C)  TempSrc: Oral Oral Oral Oral  SpO2: 97% 100% 97% 98%  Weight:   211 lb 6.4 oz (95.9 kg)   Height:        Intake/Output Summary (Last 24 hours) at 02/25/2018 0849 Last data filed at 02/25/2018 0739 Gross per 24 hour  Intake 360 ml    Output 2200 ml  Net -1840 ml   Filed Weights   02/23/18 0535 02/24/18 0411 02/25/18 0304  Weight: 218 lb 1.6 oz (98.9 kg) 213 lb 8 oz (96.8 kg) 211 lb 6.4 oz (95.9 kg)    Telemetry    sinus - Personally Reviewed  ECG    No am EKG - Personally Reviewed  Physical Exam   GEN: No acute distress.   Neck: No JVD Cardiac: RRR, no murmurs, rubs, or gallops.  Respiratory: Clear to auscultation bilaterally. GI: Soft, nontender, non-distended  MS: No edema; No deformity. Neuro:  Nonfocal  Psych: Normal affect   Labs    Chemistry Recent Labs  Lab 02/21/18 0427 02/21/18 1729 02/21/18 1735 02/22/18 0405 02/23/18 0134  NA 140  --  134* 134* 133*  K 4.3  --  3.7 4.3 4.4  CL 106  --  97* 102 100*  CO2 25  --   --  24 25  GLUCOSE 101*  --  266* 148* 105*  BUN 11  --  14 17 19   CREATININE 0.88 1.22* 1.00 1.02*  0.87  CALCIUM 7.9*  --   --  8.0* 8.0*  GFRNONAA >60 47*  --  59* >60  GFRAA >60 55*  --  >60 >60  ANIONGAP 9  --   --  8 8     Hematology Recent Labs  Lab 02/21/18 1729 02/21/18 1735 02/22/18 0405 02/23/18 0134  WBC 10.3  --  9.2 8.7  RBC 3.14*  --  2.99* 2.84*  HGB 9.1* 8.8* 8.7* 8.2*  HCT 28.1* 26.0* 26.8* 25.4*  MCV 89.5  --  89.6 89.4  MCH 29.0  --  29.1 28.9  MCHC 32.4  --  32.5 32.3  RDW 14.8  --  14.9 14.6  PLT 169  --  149* 159    Cardiac EnzymesNo results for input(s): TROPONINI in the last 168 hours. No results for input(s): TROPIPOC in the last 168 hours.   BNPNo results for input(s): BNP, PROBNP in the last 168 hours.   DDimer No results for input(s): DDIMER in the last 168 hours.   Radiology    No results found.  Cardiac Studies     Patient Profile     62 y.o. female with history of HTN. 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 CABG on 02/20/18  Assessment & Plan    1. CAD with unstable angina: Now s/p CABG. No chest pain. Continue ASA, statin, beta blocker. Agree with adding ARB  for better BP control. Planning for probable d/c home today. She will need f/u with Dr. Diona BrownerMcDowell.   For questions or updates, please contact [facility] Please consult [email or web address] for contact info under Cardiology/STEMI.      Signed, Verne Carrowhristopher McAlhany, MD  02/25/2018, 8:49 AM

## 2018-02-25 NOTE — Progress Notes (Signed)
CARDIAC REHAB PHASE I   PRE:  Rate/Rhythm: 93 SR  BP:  Sitting: 155/78      SaO2: 97 RA  MODE:  Ambulation: 344 ft 120 peak HR  POST:  Rate/Rhythm: 97 SR  BP:  Sitting: 158/79    SaO2: 98 RA  Pt ambulated 32444ft in hallway independently with front wheel rolling walker. Pt states she would like one for home, RN made aware. Reviewed d/c education with pt. Pt demonstrates understanding of sternal precautions and importance of continued use of IS. Pt feels ready for d/c and has 24 hour care arranged. Hopeful for d/c today.  [phone] Reynold Boweneresa  Jazyiah Yiu, RN BSN 02/25/2018 9:05 AM

## 2018-02-25 NOTE — Progress Notes (Addendum)
      [address]       [place], [id number]             [phone]        5 Days Post-Op Procedure(s) (LRB): CORONARY ARTERY BYPASS GRAFTING (CABG) x 4 (LIMA to LAD, SVG SEQUENTIALLY to RAMUS 1 and 2, SVG to PLB) with EVH from RIGHT GREATER SAPHENOUS VEIN and LEFT INTERNAL MAMMARY ARTERY HARVEST (N/A) TRANSESOPHAGEAL ECHOCARDIOGRAM (TEE) (N/A)  Subjective: Patient just waking up. She slept well. She wants to go home.  Objective: Vital signs in last 24 hours: Temp:  [97.9 F (36.6 C)-98.7 F (37.1 C)] 97.9 F (36.6 C) (06/17 0304) Pulse Rate:  [85-100] 96 (06/17 0304) Cardiac Rhythm: Sinus tachycardia (06/16 1901) Resp:  [16-22] 19 (06/17 0304) BP: (141-166)/(65-77) 162/66 (06/17 0304) SpO2:  [97 %-100 %] 97 % (06/17 0304) Weight:  [211 lb 6.4 oz (95.9 kg)] 211 lb 6.4 oz (95.9 kg) (06/17 0304)  Pre op weight  93.2 kg Current Weight  02/25/18 211 lb 6.4 oz (95.9 kg)      Intake/Output from previous day: 06/16 0701 - 06/17 0700 In: 600 [P.O.:600] Out: 2100 [Urine:2100]   Physical Exam:  Cardiovascular: RRR Pulmonary: Clear to auscultation bilaterally Abdomen: Soft, non tender, bowel sounds present. Extremities: Mild bilateral lower extremity edema. Wounds: Clean and dry.  No erythema or signs of infection.  Lab Results: CBC: Recent Labs    02/23/18 0134  WBC 8.7  HGB 8.2*  HCT 25.4*  PLT 159   BMET:  Recent Labs    02/23/18 0134  NA 133*  K 4.4  CL 100*  CO2 25  GLUCOSE 105*  BUN 19  CREATININE 0.87  CALCIUM 8.0*    PT/INR:  Lab Results  Component Value Date   INR 1.38 02/20/2018   INR 1.04 02/19/2018   ABG:  INR: Will add last result for INR, ABG once components are confirmed Will add last 4 CBG results once components are confirmed  Assessment/Plan:  1. CV - SR in the 90's. On Lopressor 25 mg bid. Will restart low dose Losartan for better BP control. 2.  Pulmonary - On room air. Encourage incentive spirometer. 3.  Volume Overload - On Lasix 40 mg daily 4.  Acute blood loss anemia - H and H stable 8.2 and 25.4. 5. DM-CBGs 94/103/94. On Metformin 750 mg bid, Glipizide 10 mg at hs, and Insulin. Pre op HGA1C 7. 6. Remove chest tube sutures 7. Likely discharge later today  Lelon HuhDonielle M [facility] HospitalZimmermanPA-C 02/25/2018,7:11 AM [phone]  Patient seen and examined, agree with above Home today  Salvatore DecentSteven C. Dorris FetchHendrickson, MD [facility] [phone]

## 2018-03-07 ENCOUNTER — Telehealth: Payer: Self-pay

## 2018-03-07 ENCOUNTER — Encounter: Payer: Self-pay | Admitting: Student

## 2018-03-07 ENCOUNTER — Telehealth: Payer: Self-pay | Admitting: Adult Health

## 2018-03-07 NOTE — Telephone Encounter (Signed)
Patient called c/o bloody drainage from chest tube sites. She states that she removed the steri strips from incision sites after 3 days of hospital discharge. The sites began to open with some pink to red drainage developing. Her daughter works in a medical office and  brought steri strips home and placed on the chest tube sites.   When was your surgery date 02/20/2018/s/p CABG  When were you discharged from the hospital/ 02/25/2018  1. Symptoms- c/o pink drainage from incisions of abdomen area  2. Onset of Symptoms- 2 days ago  3. Location- Abdomen   4. Severity (how bad is it?)-  The drainage is off and on   5. Are there any signs or symptoms of infection? NO  a.  NO Redness b.  NO Warm to touch c.  NO Red streaks d. NO Swelling e. NO Purulent drainage/  f. NO Fowl odor/ smell g.  NO fevers   6.  Pain (scale 0- 10)- 3/ tender  7.  Other Symptoms? NO   Patient was instructed to not remove steri strips from incision sites. To keep the area clean with soap and water. If any signs of infection develop to call our office back. She was also instructed to send a picture of the area to MD/nurse staff through her my chart. For review. She agreed to do so.  Our nursing staff will call her back after picture is reviewed.

## 2018-03-07 NOTE — Telephone Encounter (Signed)
Tashaya called to let me know she had bypass surgery 6/14, (4) and went home on the 17th and is feeling good, and just wanted to tell me thank you for listening to her, and getting her to cardiologist.

## 2018-03-08 ENCOUNTER — Ambulatory Visit (INDEPENDENT_AMBULATORY_CARE_PROVIDER_SITE_OTHER): Payer: Self-pay

## 2018-03-08 DIAGNOSIS — Z4889 Encounter for other specified surgical aftercare: Secondary | ICD-10-CM

## 2018-03-08 DIAGNOSIS — Z5189 Encounter for other specified aftercare: Secondary | ICD-10-CM

## 2018-03-08 NOTE — Progress Notes (Signed)
Patient came in today for wound check. She has been having some drainage from chest tube sites. That opened up after she removed the steri strips.  The 2 incision sites were open and swallow, no tunneling, the drainage was serosanguinous, no redness, no fevers, no pain. Dr Tyrone SageGerhardt looked at the chest tube sites and recommend to keep clean with soap and water, cover with 2x2 and tape. He did not think the area should be packed or needed any stiches or steri strips  She could use peroxide on sites when scabbing occurs.   Reviewed signs of infection with her and if any develop she will call our office.

## 2018-03-19 ENCOUNTER — Encounter: Payer: Self-pay | Admitting: Student

## 2018-03-19 ENCOUNTER — Ambulatory Visit: Payer: BC Managed Care – PPO | Admitting: Student

## 2018-03-19 VITALS — BP 128/82 | HR 94 | Ht 64.0 in | Wt 197.0 lb

## 2018-03-19 DIAGNOSIS — E785 Hyperlipidemia, unspecified: Secondary | ICD-10-CM

## 2018-03-19 DIAGNOSIS — E119 Type 2 diabetes mellitus without complications: Secondary | ICD-10-CM

## 2018-03-19 DIAGNOSIS — I10 Essential (primary) hypertension: Secondary | ICD-10-CM

## 2018-03-19 DIAGNOSIS — I2510 Atherosclerotic heart disease of native coronary artery without angina pectoris: Secondary | ICD-10-CM | POA: Diagnosis not present

## 2018-03-19 DIAGNOSIS — Z951 Presence of aortocoronary bypass graft: Secondary | ICD-10-CM | POA: Diagnosis not present

## 2018-03-19 NOTE — Progress Notes (Signed)
Cardiology Office Note    Date:  03/19/2018   ID:  Leslie Cobb, DOB 09-04-1956, MRN [id number]  PCP:  Juliette Alcide, MD  Cardiologist: Nona Dell, MD    Chief Complaint  Patient presents with  . Hospitalization Follow-up    s/p CABG in 02/2018    History of Present Illness:    Leslie Cobb is a 62 y.o. female with past medical history of HTN, HLD, and Type 2 DM who presents to the office today for hospital follow-up.  She was last examined by Dr. Diona Browner in 02/2018 following a recent NST for dyspnea on exertion and chest discomfort. NST had shown findings consistent with ischemia and was overall an intermediate risk study, therefore a cardiac catheterization was recommended for definitive evaluation. This was performed by Dr. Herbie Baltimore on 02/18/2018 and showed 95% Ost LAD stenosis, 80% Prox-LAD, 90% Ost Ramus, 85% Prox LCx, 70% mid LCx, and 95% 1st RBLB stenosis. She underwent CABG by Dr. Dorris Fetch on 02/20/2018 with LIMA-LAD, Seq SVG-Ramus 1 and 2, and SVG-PLB.  She progressed well following surgery and did develop a postoperative anemia but hemoglobin was stable at 8.2 at the time of discharge. She was discharged on ASA 325 mg daily, Atorvastatin 80 mg daily, Lasix 40 mg daily for 5 days, Losartan 50 mg daily, and Lopressor 25 mg twice daily.  In talking with the patient today, she reports overall doing well since her recent hospitalization and surgery. Reports her sternal discomfort continues to improve and is being well controlled with Tylenol. Did experience oozing along her chest tube sites but this was evaluated by CT Surgery last week and she is cleaning this daily. She denies any exertional chest discomfort and reports significant improvement in her breathing as compared to how she felt prior to surgery. Denies any recent orthopnea, PND, lower extremity edema, or palpitations.  She has been following blood pressure regularly at home and says this has been  well-controlled in the 120's-130's/80's when checked. It is initially elevated to 148/88 during today's visit, improved to 128/82 on recheck.   Past Medical History:  Diagnosis Date  . CAD (coronary artery disease)    a. 02/2018: cath due to unstable angina --> showed multivessel CAD with CABG recommended. b. 02/20/2018: s/p CABG with LIMA-LAD, Seq SVG-Ramus 1 and 2, and SVG-PLB   . Hyperlipidemia   . Hypertension   . Low back pain 08/06/2013  . Obesity   . Type 2 diabetes mellitus (HCC)   . Vaginal Pap smear, abnormal     Past Surgical History:  Procedure Laterality Date  . BREAST SURGERY Left   . CORONARY ARTERY BYPASS GRAFT N/A 02/20/2018   Procedure: CORONARY ARTERY BYPASS GRAFTING (CABG) x 4 (LIMA to LAD, SVG SEQUENTIALLY to RAMUS 1 and 2, SVG to PLB) with EVH from RIGHT GREATER SAPHENOUS VEIN and LEFT INTERNAL MAMMARY ARTERY HARVEST;  Surgeon: Loreli Slot, MD;  Location: [facility] OR;  Service: Open Heart Surgery;  Laterality: N/A;  . GALLBLADDER SURGERY    . LEFT HEART CATH AND CORONARY ANGIOGRAPHY N/A 02/18/2018   Procedure: LEFT HEART CATH AND CORONARY ANGIOGRAPHY;  Surgeon: Marykay Lex, MD;  Location: [facility] INVASIVE CV LAB;  Service: Cardiovascular;  Laterality: N/A;  . TEE WITHOUT CARDIOVERSION N/A 02/20/2018   Procedure: TRANSESOPHAGEAL ECHOCARDIOGRAM (TEE);  Surgeon: Loreli Slot, MD;  Location: [facility] OR;  Service: Open Heart Surgery;  Laterality: N/A;    Current Medications: Outpatient Medications Prior to Visit  Medication Sig  Dispense Refill  . acetaminophen (TYLENOL) 500 MG tablet Take 2 tablets (1,000 mg total) by mouth every 8 (eight) hours as needed for mild pain. 30 tablet 0  . aspirin EC 325 MG EC tablet Take 1 tablet (325 mg total) by mouth daily. 30 tablet 0  . atorvastatin (LIPITOR) 80 MG tablet Take 1 tablet (80 mg total) by mouth daily at 6 PM. 30 tablet 1  . Cholecalciferol (VITAMIN D-3) 1000 units CAPS Take 1,000 Units by mouth daily.     .  Cyanocobalamin (VITAMIN B 12 PO) Take 1,000 mcg by mouth 2 (two) times a week. Saturday and Sunday    . GLIPIZIDE XL 10 MG 24 hr tablet Take 10 mg by mouth at bedtime.  3  . losartan (COZAAR) 50 MG tablet Take 1 tablet (50 mg total) by mouth at bedtime. 30 tablet 1  . metFORMIN (GLUCOPHAGE) 500 MG tablet Take 750 mg by mouth 2 (two) times daily.     . metoprolol tartrate (LOPRESSOR) 25 MG tablet Take 1 tablet (25 mg total) by mouth 2 (two) times daily. 60 tablet 1  . Multiple Vitamin (MULTIVITAMIN) tablet Take 1 tablet by mouth daily.    . Omega-3 Fatty Acids (OMEGA 3 PO) Take 520 mg by mouth every other day.     . oxyCODONE (OXY IR/ROXICODONE) 5 MG immediate release tablet Take 5 mg by mouth every 4-6 hours PRN severe pain. 28 tablet 0  . sodium chloride (OCEAN) 0.65 % SOLN nasal spray Place 1 spray into both nostrils daily as needed for congestion.    . furosemide (LASIX) 40 MG tablet Take 1 tablet (40 mg total) by mouth daily. For 5 days then stop. 5 tablet 0  . potassium chloride (K-DUR) 10 MEQ tablet Take 1 tablet (10 mEq total) by mouth daily. For 5 days then stop. 5 tablet 0   No facility-administered medications prior to visit.      Allergies:   Patient has no known allergies.   Social History   Socioeconomic History  . Marital status: Single    Spouse name: Not on file  . Number of children: Not on file  . Years of education: Not on file  . Highest education level: Not on file  Occupational History  . Not on file  Social Needs  . Financial resource strain: Not on file  . Food insecurity:    Worry: Not on file    Inability: Not on file  . Transportation needs:    Medical: Not on file    Non-medical: Not on file  Tobacco Use  . Smoking status: Never Smoker  . Smokeless tobacco: Never Used  Substance and Sexual Activity  . Alcohol use: No  . Drug use: No  . Sexual activity: Not Currently    Birth control/protection: Post-menopausal  Lifestyle  . Physical activity:     Days per week: Not on file    Minutes per session: Not on file  . Stress: Not on file  Relationships  . Social connections:    Talks on phone: Not on file    Gets together: Not on file    Attends religious service: Not on file    Active member of club or organization: Not on file    Attends meetings of clubs or organizations: Not on file    Relationship status: Not on file  Other Topics Concern  . Not on file  Social History Narrative  . Not on file  Family History:  [name] history includes Arthritis in her mother; Diabetes in her maternal aunt, maternal grandmother, and mother; Heart disease in her brother, brother, brother, father, and mother; Hypertension in her brother and mother; Kidney disease in her mother; Lung cancer in her father.   Review of Systems:   Please see the history of present illness.     General:  No chills, fever, night sweats or weight changes.  Cardiovascular:  No dyspnea on exertion, edema, orthopnea, palpitations, paroxysmal nocturnal dyspnea. Positive for sternal pain (improving).  Dermatological: No rash, lesions/masses Respiratory: No cough, dyspnea Urologic: No hematuria, dysuria Abdominal:   No nausea, vomiting, diarrhea, bright red blood per rectum, melena, or hematemesis Neurologic:  No visual changes, wkns, changes in mental status. All other systems reviewed and are otherwise negative except as noted above.   Physical Exam:    VS:  BP 128/82   Pulse 94   Ht 5\' 4"  (1.626 m)   Wt 197 lb (89.4 kg)   SpO2 96%   BMI 33.81 kg/m    General: Well developed, well nourished Caucasian female appearing in no acute distress. Head: Normocephalic, atraumatic, sclera non-icteric, no xanthomas, nares are without discharge.  Neck: No carotid bruits. JVD not elevated.  Lungs: Respirations regular and unlabored, without wheezes or rales.  Heart: Regular rate and rhythm. No S3 or S4.  No murmur, no rubs, or gallops appreciated. Sternal  incision appears well-healing with no erythema or drainage present. Steri-strips had been removed along chest tube sites prior to today's visit but no active drainage noted. Abdomen: Soft, non-tender, non-distended with normoactive bowel sounds. No hepatomegaly. No rebound/guarding. No obvious abdominal masses. Msk:  Strength and tone appear normal for age. No joint deformities or effusions. Extremities: No clubbing or cyanosis. No lower extremity edema.  Distal pedal pulses are 2+ bilaterally. Vein graft harvest sites without significant erythema or drainage.  Neuro: Alert and oriented X 3. Moves all extremities spontaneously. No focal deficits noted. Psych:  Responds to questions appropriately with a normal affect. Skin: No rashes or lesions noted  Wt Readings from Last 3 Encounters:  03/19/18 197 lb (89.4 kg)  02/25/18 211 lb 6.4 oz (95.9 kg)  02/15/18 207 lb (93.9 kg)     Studies/Labs Reviewed:   EKG:  EKG is not ordered today. EKG from 02/21/2018 is reviewed which shows NSR, HR 78, with no acute ST changes when compared to prior tracings.   Recent Labs: 02/21/2018: Magnesium 1.8 02/23/2018: BUN 19; Creatinine, Ser 0.87; Hemoglobin 8.2; Platelets 159; Potassium 4.4; Sodium 133   Lipid Panel    Component Value Date/Time   CHOL 132 02/20/2018 0447   TRIG 143 02/20/2018 0447   HDL 46 02/20/2018 0447   CHOLHDL 2.9 02/20/2018 0447   VLDL 29 02/20/2018 0447   LDLCALC 57 02/20/2018 0447    Additional studies/ records that were reviewed today include:   Echocardiogram: 01/23/2018 Study Conclusions  - Left ventricle: The cavity size was normal. Wall thickness was   increased in a pattern of mild LVH. Systolic function was normal.   The estimated ejection fraction was in the range of 55% to 60%.   Wall motion was normal; there were no regional wall motion   abnormalities. - Aortic valve: Mildly to moderately calcified annulus. Trileaflet;   mildly calcified leaflets. - Mitral  valve: There was trivial regurgitation. - Right atrium: Central venous pressure (est): 3 mm Hg. - Atrial septum: No defect or patent foramen ovale was identified. -  Tricuspid valve: There was trivial regurgitation. - Pulmonary arteries: Systolic pressure could not be accurately   estimated. - Pericardium, extracardiac: A prominent pericardial fat pad was   present.   Cardiac Catheterization: 02/18/2018  Ost LAD to Prox LAD lesion is 95% stenosed. Prox LAD lesion is 80% stenosed.  Ost Ramus to Ramus lesion is 90% stenosed. Ramus lesion is 45% stenosed. Lat Ramus lesion is 100% stenosed.  Prox Cx lesion is 85% stenosed. Mid Cx lesion is 70% stenosed.  1st RPLB lesion is 95% stenosed.  The left ventricular systolic function is normal. The left ventricular ejection fraction is 55-65% by visual estimate.  LV end diastolic pressure is mildly elevated.    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 Multivessel PCI. Convert to High Dose Statin Continue Beta Blocker Convert from Metformin to SSI  Assessment:    1. Coronary artery disease involving native coronary artery of native heart without angina pectoris   2. Hx of CABG   3. Essential hypertension   4. Hyperlipidemia LDL goal <70   5. Type 2 diabetes mellitus without complication, without long-term current use of insulin (HCC)      Plan:   In order of problems listed above:  1. CAD/ Recent CABG - recent catheterization showed multivessel CAD as outlined above and she underwent CABG by Dr. Dorris Fetch on 02/20/2018 with LIMA-LAD, Seq SVG-Ramus 1 and 2, and SVG-PLB.  - has overall progressed well since hospital discharge and denies any recent exertional chest pain. Reports significant improvement in her respiratory status. Sternal and SVG harvest sites appear well-healing on examination today. - will continue on ASA  325 mg daily, Atorvastatin 80 mg daily, Losartan 50 mg daily, and Lopressor 25 mg twice daily. She does need a repeat CBC due to post-operative anemia but wishes to wait for 2 more weeks as she is due for labs with her PCP at that time. I advised her that if this is delayed, she can reach out to [place] and lab work can be obtained at [facility].   2. HTN - BP initially elevated to 148/88 during today's visit, improved to 128/82 on recheck. Reports this has been well-controlled when checked at home. - will continue on Lopressor 25mg  BID and Losartan 50mg  daily.   3. HLD - FLP in 02/2018 showed total cholesterol of 132, HDL 46, and LDL 57. At goal of LDL < 70. Was switched to high-dose Atorvastatin 80mg  daily during her hospitalization. Will need a repeat FLP and LFT's within the next 2-4 weeks as this was initiated approximately 4 weeks ago. She is due for repeat labs with her PCP in 2 weeks and wishes to have the appropriate labs at that time.   4. Type 2 DM - Hgb A1c at 7.0 in 02/2018. Followed by PCP. She has been monitoring her carbohydrate intake closely since hospital discharge.     Medication Adjustments/Labs and Tests Ordered: Current medicines are reviewed at length with the patient today.  Concerns regarding medicines are outlined above.  Medication changes, Labs and Tests ordered today are listed in the Patient Instructions below. Patient Instructions  Medication Instructions:   No changes.   If you need a refill on your cardiac medications before your next appointment, please call your pharmacy.  Labwork: Make sure a Fasting Lipid Panel is obtained with your labs in 04/2018.   Follow-Up: Your physician  wants you to follow-up in: 3 months with Dr. Diona Browner   Thank you for choosing [facility] at [facility]!!        Signed, Ellsworth Lennox, PA-C  03/19/2018 7:20 PM    [facility] [address] [place], [place] [address] Phone: [phone]

## 2018-03-19 NOTE — Patient Instructions (Signed)
Medication Instructions:   No changes.   If you need a refill on your cardiac medications before your next appointment, please call your pharmacy.  Labwork: Make sure a Fasting Lipid Panel is obtained with your labs in 04/2018.   Follow-Up: Your physician wants you to follow-up in: 3 months with Dr. Diona BrownerMcDowell   Thank you for choosing [facility]!!

## 2018-03-25 ENCOUNTER — Other Ambulatory Visit: Payer: Self-pay | Admitting: Thoracic Surgery (Cardiothoracic Vascular Surgery)

## 2018-03-25 DIAGNOSIS — I2510 Atherosclerotic heart disease of native coronary artery without angina pectoris: Secondary | ICD-10-CM

## 2018-03-26 ENCOUNTER — Ambulatory Visit
Admission: RE | Admit: 2018-03-26 | Discharge: 2018-03-26 | Disposition: A | Discharge status: Home / Self Care | Payer: BC Managed Care – PPO | Source: Ambulatory Visit | Attending: Thoracic Surgery (Cardiothoracic Vascular Surgery) | Admitting: Thoracic Surgery (Cardiothoracic Vascular Surgery)

## 2018-03-26 ENCOUNTER — Ambulatory Visit (INDEPENDENT_AMBULATORY_CARE_PROVIDER_SITE_OTHER): Payer: Self-pay | Admitting: Thoracic Surgery (Cardiothoracic Vascular Surgery)

## 2018-03-26 ENCOUNTER — Other Ambulatory Visit: Payer: Self-pay | Admitting: Thoracic Surgery (Cardiothoracic Vascular Surgery)

## 2018-03-26 VITALS — BP 149/85 | HR 84 | Resp 20 | Ht 64.0 in | Wt 197.0 lb

## 2018-03-26 DIAGNOSIS — I2510 Atherosclerotic heart disease of native coronary artery without angina pectoris: Secondary | ICD-10-CM

## 2018-03-26 DIAGNOSIS — Z951 Presence of aortocoronary bypass graft: Secondary | ICD-10-CM

## 2018-03-26 DIAGNOSIS — Z736 Limitation of activities due to disability: Secondary | ICD-10-CM

## 2018-03-26 MED ORDER — HYDROCHLOROTHIAZIDE 12.5 MG PO TABS: 25.0000 mg | ORAL_TABLET | Freq: Every day | ORAL | 6 refills | Status: DC

## 2018-03-26 MED ORDER — LOSARTAN POTASSIUM 100 MG PO TABS: 100.0000 mg | ORAL_TABLET | Freq: Every day | ORAL | 1 refills | Status: DC

## 2018-03-26 NOTE — Progress Notes (Signed)
[address]       [place] [id number]             [phone]      HPI: [name] returns for a scheduled postoperative follow-up visit.  [place] Manson PasseyBrown is a 62 year old woman with a past history significant for hypertension, hyperlipidemia, obesity, type 2 diabetes, and a strong family history of coronary disease.  She presented with exertional chest tightness and shortness of breath.  A Lexiscan Myoview showed a reversible anterolateral defect and nonspecific ST changes.  At catheterization she had severe three-vessel disease.  She underwent coronary bypass grafting x4 on 02/20/2018.  Her postoperative course was uncomplicated and she went home on day 5.  She has been feeling well.  She has not had any recurrent angina.  She has some mild incisional discomfort.  She is not taking any narcotics.  She occasionally will take Tylenol.  She has been driving on a limited basis.   Past Medical History:  Diagnosis Date  . CAD (coronary artery disease)    a. 02/2018: cath due to unstable angina --> showed multivessel CAD with CABG recommended. b. 02/20/2018: s/p CABG with LIMA-LAD, Seq SVG-Ramus 1 and 2, and SVG-PLB   . Hyperlipidemia   . Hypertension   . Low back pain 08/06/2013  . Obesity   . Type 2 diabetes mellitus (HCC)   . Vaginal Pap smear, abnormal     Current Outpatient Medications  Medication Sig Dispense Refill  . acetaminophen (TYLENOL) 500 MG tablet Take 2 tablets (1,000 mg total) by mouth every 8 (eight) hours as needed for mild pain. 30 tablet 0  . aspirin EC 325 MG EC tablet Take 1 tablet (325 mg total) by mouth daily. 30 tablet 0  . atorvastatin (LIPITOR) 80 MG tablet Take 1 tablet (80 mg total) by mouth daily at 6 PM. 30 tablet 1  . Cholecalciferol (VITAMIN D-3) 1000 units CAPS Take 1,000 Units by mouth daily.     . Cyanocobalamin (VITAMIN B 12 PO) Take 1,000 mcg by mouth 2 (two) times a week. Saturday and Sunday    . GLIPIZIDE XL 10 MG 24 hr tablet  Take 10 mg by mouth at bedtime.  3  . losartan (COZAAR) 50 MG tablet Take 1 tablet (50 mg total) by mouth at bedtime. 30 tablet 1  . metFORMIN (GLUCOPHAGE) 500 MG tablet Take 750 mg by mouth 2 (two) times daily.     . metoprolol tartrate (LOPRESSOR) 25 MG tablet Take 1 tablet (25 mg total) by mouth 2 (two) times daily. 60 tablet 1  . Multiple Vitamin (MULTIVITAMIN) tablet Take 1 tablet by mouth daily.    . Omega-3 Fatty Acids (OMEGA 3 PO) Take 520 mg by mouth every other day.     . oxyCODONE (OXY IR/ROXICODONE) 5 MG immediate release tablet Take 5 mg by mouth every 4-6 hours PRN severe pain. 28 tablet 0  . sodium chloride (OCEAN) 0.65 % SOLN nasal spray Place 1 spray into both nostrils daily as needed for congestion.     No current facility-administered medications for this visit.     Physical Exam BP (!) 149/85   Pulse 84   Resp 20   Ht 5\' 4"  (1.626 m)   Wt 197 lb (89.4 kg)   SpO2 97% Comment: RA  BMI 33.81 kg/m  Obese 62 year old woman in no acute distress Alert and oriented x3 with no focal deficits Lungs clear with equal breath sounds bilaterally  Sternum stable, incision healing well Cardiac regular rate and rhythm normal S1 and S2 Leg incisions healing well with minimal eschar, no peripheral edema  Diagnostic Tests: CHEST - 2 VIEW  COMPARISON:  02/23/2018  FINDINGS: Continued improvement left lower lobe atelectasis which is now minimal. No significant effusion. Right lung clear  Cardiac enlargement with changes of CABG. Negative for heart failure  IMPRESSION: Near complete clearing of left lower lobe atelectasis.   Electronically Signed   By: Marlan Palau M.D.   On: 03/26/2018 14:44   Impression: Mrs. Sitton is a 62 year old woman with multiple cardiac risk factors who presented with exertional chest tightness and shortness of breath.  She had a positive Lexiscan Myoview.  At catheterization she had severe three-vessel coronary disease.  She underwent  coronary bypass grafting x4 on 02/20/2018.  Her postoperative recovery was unremarkable.  She is doing very well at this point in time.  She has minimal discomfort.  She is not requiring any narcotics.  She has not had any recurrent angina.  She should not lift anything over 10 pounds for another 2 weeks.  After that her activities are unrestricted, but she should build into new activities gradually.  She may drive on a limited basis.  Appropriate precautions were reviewed.  She had questions about returning to work.  I recommended that she wait until 12 weeks postoperatively.  We set a tentative date for September 16.  She sees Dr. Diona Browner in August and can talk with him further about that at that time.  Hypertension-pressure was elevated.  I am going to put her back on her preoperative medication,  hydrochlorothiazide 25 mg daily and also increase her losartan to her preoperative dose of 100 mg daily  Plan: Restart hydrochlorothiazide 25 mg daily  Increase losartan to 100 mg daily  Follow-up with Dr. Diona Browner as scheduled.  I will be happy to see Ms. Cadden back anytime if I can be of any further assistance with her care  Loreli Slot, MD [facility] [phone]

## 2018-04-22 ENCOUNTER — Other Ambulatory Visit: Payer: Self-pay | Admitting: Physician Assistant

## 2018-04-24 ENCOUNTER — Telehealth: Payer: Self-pay

## 2018-04-24 NOTE — Telephone Encounter (Signed)
Patient called and had questions about weight restrictions.  She wanted clarification for work.  Per Dr. Sunday CornHendrickson's note patient is not on any weight restrictions, however she is to gradually work into new activities.  Patient made aware and verbalized receipt.

## 2018-06-21 NOTE — Progress Notes (Signed)
Cardiology Office Note  Date: 06/24/2018   ID: Leslie Cobb, DOB 04-25-1956, MRN [id number]  PCP: Juliette Alcide, MD  Primary Cardiologist: Nona Dell, MD   Chief Complaint  Patient presents with  . Coronary Artery Disease    History of Present Illness: Leslie Cobb is a 62 y.o. female last seen by Ms. Strader PA-C in July.  6 she presents for a routine follow-up visit.  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  She states that she is gradually getting back her stamina.  No exertional chest pain or unusual shortness of breath.  She reports compliance with her medications.  Present cardiac regimen includes aspirin, Lipitor, hydrochlorothiazide, Cozaar, and Lopressor.  She has had no cough, fevers or chills.  Recent allergy symptoms.  No orthopnea or PND.  Follow-up lab work from August is outlined below.  Past Medical History:  Diagnosis Date  . CAD (coronary artery disease)    a. 02/2018: cath due to unstable angina --> showed multivessel CAD with CABG recommended. b. 02/20/2018: s/p CABG with LIMA-LAD, Seq SVG-Ramus 1 and 2, and SVG-PLB   . Hyperlipidemia   . Hypertension   . Low back pain 08/06/2013  . Obesity   . Type 2 diabetes mellitus (HCC)   . Vaginal Pap smear, abnormal     Past Surgical History:  Procedure Laterality Date  . BREAST SURGERY Left   . CORONARY ARTERY BYPASS GRAFT N/A 02/20/2018   Procedure: CORONARY ARTERY BYPASS GRAFTING (CABG) x 4 (LIMA to LAD, SVG SEQUENTIALLY to RAMUS 1 and 2, SVG to PLB) with EVH from RIGHT GREATER SAPHENOUS VEIN and LEFT INTERNAL MAMMARY ARTERY HARVEST;  Surgeon: Loreli Slot, MD;  Location: [facility] OR;  Service: Open Heart Surgery;  Laterality: N/A;  . GALLBLADDER SURGERY    . LEFT HEART CATH AND CORONARY ANGIOGRAPHY N/A 02/18/2018   Procedure: LEFT HEART CATH AND CORONARY ANGIOGRAPHY;  Surgeon: Marykay Lex, MD;  Location[facility] INVASIVE CV LAB;  Service: Cardiovascular;   Laterality: N/A;  . TEE WITHOUT CARDIOVERSION N/A 02/20/2018   Procedure: TRANSESOPHAGEAL ECHOCARDIOGRAM (TEE);  Surgeon: Loreli Slot, MD;  Location: [facility] OR;  Service: Open Heart Surgery;  Laterality: N/A;    Current Outpatient Medications  Medication Sig Dispense Refill  . acetaminophen (TYLENOL) 500 MG tablet Take 2 tablets (1,000 mg total) by mouth every 8 (eight) hours as needed for mild pain. 30 tablet 0  . aspirin 81 MG tablet Take 1 tablet (81 mg total) by mouth daily.    Marland Kitchen atorvastatin (LIPITOR) 80 MG tablet Take 1 tablet (80 mg total) by mouth daily at 6 PM. 30 tablet 1  . Cholecalciferol (VITAMIN D-3) 1000 units CAPS Take 1,000 Units by mouth daily.     . Cyanocobalamin (VITAMIN B 12 PO) Take 1,000 mcg by mouth 2 (two) times a week. Saturday and Sunday    . GLIPIZIDE XL 10 MG 24 hr tablet Take 10 mg by mouth at bedtime.  3  . hydrochlorothiazide (HYDRODIURIL) 25 MG tablet Take 25 mg by mouth daily.    Marland Kitchen losartan (COZAAR) 100 MG tablet Take 1 tablet (100 mg total) by mouth at bedtime. 30 tablet 1  . metFORMIN (GLUCOPHAGE) 500 MG tablet Take 750 mg by mouth 2 (two) times daily.     . metoprolol tartrate (LOPRESSOR) 25 MG tablet Take 1 tablet (25 mg total) by mouth 2 (two) times daily. 60 tablet 1  . Multiple Vitamin (MULTIVITAMIN) tablet  Take 1 tablet by mouth daily.    . Omega-3 Fatty Acids (OMEGA 3 PO) Take 520 mg by mouth every other day.     . sodium chloride (OCEAN) 0.65 % SOLN nasal spray Place 1 spray into both nostrils daily as needed for congestion.     No current facility-administered medications for this visit.    Allergies:  Patient has no known allergies.   Social History: The patient  reports that she has never smoked. She has never used smokeless tobacco. She reports that she does not drink alcohol or use drugs.   ROS:  Please see the history of present illness. Otherwise, complete review of systems is positive for none.  All other systems are reviewed and  negative.   Physical Exam: VS:  BP 118/78   Pulse 73   Ht 5\' 4"  (1.626 m)   Wt 195 lb (88.5 kg)   SpO2 98%   BMI 33.47 kg/m , BMI Body mass index is 33.47 kg/m.  Wt Readings from Last 3 Encounters:  06/24/18 195 lb (88.5 kg)  03/26/18 197 lb (89.4 kg)  03/19/18 197 lb (89.4 kg)    General: Patient appears comfortable at rest. HEENT: Conjunctiva and lids normal, oropharynx clear. Neck: Supple, no elevated JVP or carotid bruits, no thyromegaly. Lungs: Clear to auscultation, nonlabored breathing at rest. Thorax: Well-healed sternal incision. Cardiac: Regular rate and rhythm, no S3, soft systolic murmur. Abdomen: Soft, nontender, bowel sounds present. Extremities: No pitting edema, distal pulses 2+. Skin: Warm and dry. Musculoskeletal: No kyphosis. Neuropsychiatric: Alert and oriented x3, affect grossly appropriate.  ECG: I personally reviewed the tracing from 02/21/2018 which showed sinus rhythm with nonspecific ST changes.   Recent Labwork: 02/21/2018: Magnesium 1.8 02/23/2018: BUN 19; Creatinine, Ser 0.87; Hemoglobin 8.2; Platelets 159; Potassium 4.4; Sodium 133     Component Value Date/Time   CHOL 132 02/20/2018 0447   TRIG 143 02/20/2018 0447   HDL 46 02/20/2018 0447   CHOLHDL 2.9 02/20/2018 0447   VLDL 29 02/20/2018 0447   LDLCALC 57 02/20/2018 0447  August 2019: BUN 16, creatinine 0.83, potassium 3.8, AST 20, ALT 21, cholesterol 112, triglycerides 124, HDL 42, LDL 45, hemoglobin A1c 5.7, hemoglobin 11.8, platelets 314  Other Studies Reviewed Today:  Cardiac catheterization 02/18/2018:  Ost LAD to Prox LAD lesion is 95% stenosed. Prox LAD lesion is 80% stenosed.  Ost Ramus to Ramus lesion is 90% stenosed. Ramus lesion is 45% stenosed. Lat Ramus lesion is 100% stenosed.  Prox Cx lesion is 85% stenosed. Mid Cx lesion is 70% stenosed.  1st RPLB lesion is 95% stenosed.  The left ventricular systolic function is normal. The left ventricular ejection fraction is 55-65%  by visual estimate.  LV end diastolic pressure is mildly elevated.    Severe Multivessel CAD with Class III Angina  Normal LVEF with mildly elevated LVEDP  Echocardiogram 01/23/2018: Study Conclusions  - Left ventricle: The cavity size was normal. Wall thickness was   increased in a pattern of mild LVH. Systolic function was normal.   The estimated ejection fraction was in the range of 55% to 60%.   Wall motion was normal; there were no regional wall motion   abnormalities. - Aortic valve: Mildly to moderately calcified annulus. Trileaflet;   mildly calcified leaflets. - Mitral valve: There was trivial regurgitation. - Right atrium: Central venous pressure (est): 3 mm Hg. - Atrial septum: No defect or patent foramen ovale was identified. - Tricuspid valve: There was trivial regurgitation. - Pulmonary  arteries: Systolic pressure could not be accurately   estimated. - Pericardium, extracardiac: A prominent pericardial fat pad was   present.  Assessment and Plan:  1.  Multivessel CAD status post CABG in June.  She is returning to regular activities, back at work.  We discussed her medications, after she finishes her current bottle of aspirin this will be reduced to 81 mg daily.  Otherwise no changes made today.  2.  Mixed hyperlipidemia, on Lipitor with follow-up LDL 45 in August.  3.  Essential hypertension, blood pressure is well controlled today.  No changes made to current regimen.  Keep follow-up with Dr. Leandrew Koyanagi.  4.  Type 2 diabetes mellitus, continues follow-up with Dr. Leandrew Koyanagi.  Last hemoglobin A1c 5.7.  Current medicines were reviewed with the patient today.  Disposition: Follow-up in 6 months.  Signed, Jonelle Sidle, MD, [facility] 06/24/2018 8:52 AM    [facility] at [facility] [address] [place], [place], [place] [address] Phone: [phone]; Fax: [phone]

## 2018-06-24 ENCOUNTER — Ambulatory Visit: Payer: BC Managed Care – PPO | Admitting: Cardiology

## 2018-06-24 ENCOUNTER — Encounter: Payer: Self-pay | Admitting: Cardiology

## 2018-06-24 VITALS — BP 118/78 | HR 73 | Ht 64.0 in | Wt 195.0 lb

## 2018-06-24 DIAGNOSIS — E782 Mixed hyperlipidemia: Secondary | ICD-10-CM

## 2018-06-24 DIAGNOSIS — E119 Type 2 diabetes mellitus without complications: Secondary | ICD-10-CM

## 2018-06-24 DIAGNOSIS — I10 Essential (primary) hypertension: Secondary | ICD-10-CM

## 2018-06-24 DIAGNOSIS — I25119 Atherosclerotic heart disease of native coronary artery with unspecified angina pectoris: Secondary | ICD-10-CM

## 2018-06-24 MED ORDER — ASPIRIN EC 81 MG PO TBEC: 81.0000 mg | DELAYED_RELEASE_TABLET | Freq: Every day | ORAL | Status: AC

## 2018-06-24 NOTE — Patient Instructions (Signed)
Medication Instructions:   Decrease Aspirin to 81mg  daily after finishing current bottle of your 325mg  tabs.   Continue all other medications.    Labwork: none  Testing/Procedures: none  Follow-Up: Your physician wants you to follow up in: 6 months.  You will receive a reminder letter in the mail one-two months in advance.  If you don't receive a letter, please call our office to schedule the follow up appointment   Any Other Special Instructions Will Be Listed Below (If Applicable).  If you need a refill on your cardiac medications before your next appointment, please call your pharmacy.

## 2018-09-02 ENCOUNTER — Other Ambulatory Visit: Payer: Self-pay | Admitting: Physician Assistant

## 2018-10-08 ENCOUNTER — Telehealth: Payer: Self-pay | Admitting: Adult Health

## 2018-10-08 NOTE — Telephone Encounter (Signed)
Patient called, would like to speak to [facility] because she knows she'll give her a straight answer.  She would not tell me the reason for her call.  [phone]

## 2018-10-08 NOTE — Telephone Encounter (Signed)
Pt complains of ?boil vs hair bump where was shaved when had heart surgery, they come and go but hurt, to come in 1/29 at 8:30 am for check

## 2018-10-09 ENCOUNTER — Ambulatory Visit: Payer: BC Managed Care – PPO | Admitting: Adult Health

## 2018-10-09 ENCOUNTER — Encounter: Payer: Self-pay | Admitting: Adult Health

## 2018-10-09 VITALS — BP 130/73 | HR 68 | Ht 64.0 in | Wt 200.5 lb

## 2018-10-09 DIAGNOSIS — L0293 Carbuncle, unspecified: Secondary | ICD-10-CM | POA: Diagnosis not present

## 2018-10-09 MED ORDER — SILVER SULFADIAZINE 1 % EX CREA: 1.0000 "application " | TOPICAL_CREAM | Freq: Two times a day (BID) | CUTANEOUS | 0 refills | Status: DC

## 2018-10-09 MED ORDER — SULFAMETHOXAZOLE-TRIMETHOPRIM 800-160 MG PO TABS: 1.0000 | ORAL_TABLET | Freq: Two times a day (BID) | ORAL | 1 refills | Status: DC

## 2018-10-09 NOTE — Progress Notes (Signed)
Patient ID: Leslie Cobb, female   DOB: [birth year], 63 y.o.   MRN: [id number] History of Present Illness: Leslie Cobb is a 63 year old white female,worked in for bumps on labia, they are tender and recurrent. These started last year, after having heart surgery, and have recurrent.  PCP is Dr Leandrew Koyanagi.  Current Medications, Allergies, Past Medical History, Past Surgical History, Family History and Social History were reviewed in [employer] record.     Review of Systems: Tender bumps on labia    Physical Exam:BP 130/73 (BP Location: Left Arm, Patient Position: Sitting, Cuff Size: Normal)   Pulse 68   Ht 5\' 4"  (1.626 m)   Wt 200 lb 8 oz (90.9 kg)   BMI 34.42 kg/m  General:  Well developed, well nourished, no acute distress Skin:  Warm and dry Pelvic:  External genitalia:has 3 red tender nodules, 1 on each labia and on butt cheek, none draining, all about 1 cm Psych:  No mood changes, alert and cooperative,seems happy Fall risk is low. Pt gave verbal consent for exam with no chaperone.  Impression: 1. Recurrent boils       Plan: Meds ordered this encounter  Medications  . sulfamethoxazole-trimethoprim (BACTRIM DS,SEPTRA DS) 800-160 MG tablet    Sig: Take 1 tablet by mouth 2 (two) times daily. Take 1 bid    Dispense:  28 tablet    Refill:  1    Order Specific Question:   Supervising Provider    Answer:   EURE, LUTHER H [[id number]]  . silver sulfADIAZINE (SILVADENE) 1 % cream    Sig: Apply 1 application topically 2 (two) times daily.    Dispense:  50 g    Refill:  0    Order Specific Question:   Supervising Provider    Answer:   Duane Lope H [[id number]]  F/U prn Review handout on boils  Use antibacterial bar soap

## 2018-10-09 NOTE — Patient Instructions (Addendum)
Skin Abscess  A skin abscess is an infected area of your skin that contains pus and other material. An abscess can happen in any part of your body. Some abscesses break open (rupture) on their own. Most continue to get worse unless they are treated. The infection can spread deeper into the body and into your blood, which can make you feel sick. A skin abscess is caused by germs that enter the skin through a cut or scrape. It can also be caused by blocked oil and sweat glands or infected hair follicles. This condition is usually treated by:  Draining the pus.  Taking antibiotic medicines.  Placing a warm, wet washcloth over the abscess. Follow these instructions at home: Medicines   Take over-the-counter and prescription medicines only as told by your doctor.  If you were prescribed an antibiotic medicine, take it as told by your doctor. Do not stop taking the antibiotic even if you start to feel better. Abscess care   If you have an abscess that has not drained, place a warm, clean, wet washcloth over the abscess several times a day. Do this as told by your doctor.  Follow instructions from your doctor about how to take care of your abscess. Make sure you: ? Cover the abscess with a bandage (dressing). ? Change your bandage or gauze as told by your doctor. ? Wash your hands with soap and water before you change the bandage or gauze. If you cannot use soap and water, use hand sanitizer.  Check your abscess every day for signs that the infection is getting worse. Check for: ? More redness, swelling, or pain. ? More fluid or blood. ? Warmth. ? More pus or a bad smell. General instructions  To avoid spreading the infection: ? Do not share personal care items, towels, or hot tubs with others. ? Avoid making skin-to-skin contact with other people.  Keep all follow-up visits as told by your doctor. This is important. Contact a doctor if:  You have more redness, swelling, or pain  around your abscess.  You have more fluid or blood coming from your abscess.  Your abscess feels warm when you touch it.  You have more pus or a bad smell coming from your abscess.  You have a fever.  Your muscles ache.  You have chills.  You feel sick. Get help right away if:  You have very bad (severe) pain.  You see red streaks on your skin spreading away from the abscess. Summary  A skin abscess is an infected area of your skin that contains pus and other material.  The abscess is caused by germs that enter the skin through a cut or scrape. It can also be caused by blocked oil and sweat glands or infected hair follicles.  Follow your doctor's instructions on caring for your abscess, taking medicines, preventing infections, and keeping follow-up visits. This information is not intended to replace advice given to you by your health care provider. Make sure you discuss any questions you have with your health care provider. Document Released: 02/14/2008 Document Revised: 10/11/2017 Document Reviewed: 10/11/2017 Elsevier Interactive Patient Education  2019 Elsevier Inc. Take antibiotics til finished Use antibacterial soap Use silvadene thin layer bid

## 2018-12-16 ENCOUNTER — Other Ambulatory Visit: Payer: Self-pay | Admitting: Adult Health

## 2018-12-16 ENCOUNTER — Ambulatory Visit: Payer: BC Managed Care – PPO | Admitting: Cardiology

## 2019-02-11 ENCOUNTER — Other Ambulatory Visit: Payer: BC Managed Care – PPO | Admitting: Adult Health

## 2019-03-12 ENCOUNTER — Other Ambulatory Visit: Payer: BC Managed Care – PPO | Admitting: Adult Health

## 2019-03-17 ENCOUNTER — Encounter: Payer: Self-pay | Admitting: Cardiology

## 2019-03-17 ENCOUNTER — Ambulatory Visit: Payer: BC Managed Care – PPO | Admitting: Cardiology

## 2019-03-17 ENCOUNTER — Other Ambulatory Visit: Payer: Self-pay

## 2019-03-17 VITALS — BP 157/80 | HR 62 | Temp 98.3°F | Ht 64.0 in | Wt 204.0 lb

## 2019-03-17 DIAGNOSIS — I10 Essential (primary) hypertension: Secondary | ICD-10-CM | POA: Diagnosis not present

## 2019-03-17 DIAGNOSIS — E119 Type 2 diabetes mellitus without complications: Secondary | ICD-10-CM | POA: Diagnosis not present

## 2019-03-17 DIAGNOSIS — I25119 Atherosclerotic heart disease of native coronary artery with unspecified angina pectoris: Secondary | ICD-10-CM | POA: Diagnosis not present

## 2019-03-17 DIAGNOSIS — E782 Mixed hyperlipidemia: Secondary | ICD-10-CM

## 2019-03-17 NOTE — Patient Instructions (Signed)
Medication Instructions: Your physician recommends that you continue on your current medications as directed. Please refer to the Current Medication list given to you today.   Labwork: none  Procedures/Testing: none  Follow-Up: 6 months with Dr.McDowell  Any Additional Special Instructions Will Be Listed Below (If Applicable).     If you need a refill on your cardiac medications before your next appointment, please call your pharmacy.      Thank you for choosing [facility] !        

## 2019-03-17 NOTE — Progress Notes (Signed)
Cardiology Office Note  Date: 03/17/2019   ID: Leslie Cobb, DOB 1955-12-08, MRN [id number]  PCP:  Juliette AlcideBurdine, Steven E, MD  Cardiologist:  Nona DellSamuel Terese Heier, MD Electrophysiologist:  None   Chief Complaint  Patient presents with  . Coronary Artery Disease    History of Present Illness: Leslie Cobb is a 63 y.o. female last seen in October 2019.  She presents for a routine visit.  She denies any significant angina symptoms or breathlessness beyond NYHA class II.  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  Right now she is working 4 days a week, 10-hour days, helping to strip the floors.  I reviewed her medications which are stable from a cardiac perspective and outlined below.  She states that she is due to see her PCP in early August for full set of lab work and a physical.  She is due for a follow-up lipid panel.  I personally reviewed her ECG today which shows a sinus rhythm with low voltage, decreased R wave progression, nonspecific T wave changes.  Past Medical History:  Diagnosis Date  . CAD (coronary artery disease)    a. 02/2018: cath due to unstable angina --> showed multivessel CAD with CABG recommended. b. 02/20/2018: s/p CABG with LIMA-LAD, Seq SVG-Ramus 1 and 2, and SVG-PLB   . Hyperlipidemia   . Hypertension   . Low back pain 08/06/2013  . Obesity   . Type 2 diabetes mellitus (HCC)   . Vaginal Pap smear, abnormal     Past Surgical History:  Procedure Laterality Date  . BREAST SURGERY Left   . CORONARY ARTERY BYPASS GRAFT N/A 02/20/2018   Procedure: CORONARY ARTERY BYPASS GRAFTING (CABG) x 4 (LIMA to LAD, SVG SEQUENTIALLY to RAMUS 1 and 2, SVG to PLB) with EVH from RIGHT GREATER SAPHENOUS VEIN and LEFT INTERNAL MAMMARY ARTERY HARVEST;  Surgeon: Loreli SlotHendrickson, Steven C, MD;  Location: [facility] OR;  Service: Open Heart Surgery;  Laterality: N/A;  . GALLBLADDER SURGERY    . LEFT HEART CATH AND CORONARY ANGIOGRAPHY N/A 02/18/2018    Procedure: LEFT HEART CATH AND CORONARY ANGIOGRAPHY;  Surgeon: Marykay LexHarding, David W, MD;  [facility];  Service: Cardiovascular;  Laterality: N/A;  . TEE WITHOUT CARDIOVERSION N/A 02/20/2018   Procedure: TRANSESOPHAGEAL ECHOCARDIOGRAM (TEE);  Surgeon: Loreli SlotHendrickson, Steven C, MD;  [facility];  Service: Open Heart Surgery;  Laterality: N/A;    Current Outpatient Medications  Medication Sig Dispense Refill  . acetaminophen (TYLENOL) 500 MG tablet Take 2 tablets (1,000 mg total) by mouth every 8 (eight) hours as needed for mild pain. 30 tablet 0  . aspirin 81 MG tablet Take 1 tablet (81 mg total) by mouth daily.    Marland Kitchen. atorvastatin (LIPITOR) 80 MG tablet Take 1 tablet (80 mg total) by mouth daily at 6 PM. 30 tablet 1  . Cholecalciferol (VITAMIN D-3) 1000 units CAPS Take 1,000 Units by mouth daily.     . Cyanocobalamin (VITAMIN B 12 PO) Take 1,000 mcg by mouth 2 (two) times a week. Saturday and Sunday    . fluticasone (FLONASE) 50 MCG/ACT nasal spray USE 2 SPRAY(S) IN EACH NOSTRIL ONCE DAILY    . GLIPIZIDE XL 10 MG 24 hr tablet Take 10 mg by mouth at bedtime.  3  . hydrochlorothiazide (HYDRODIURIL) 25 MG tablet Take 25 mg by mouth daily.    Marland Kitchen. losartan (COZAAR) 100 MG tablet Take 1 tablet (100 mg total) by  mouth at bedtime. 30 tablet 1  . metFORMIN (GLUCOPHAGE) 500 MG tablet Take 750 mg by mouth 2 (two) times daily.     . metoprolol tartrate (LOPRESSOR) 25 MG tablet Take 1 tablet (25 mg total) by mouth 2 (two) times daily. 60 tablet 1  . Multiple Vitamin (MULTIVITAMIN) tablet Take 1 tablet by mouth daily.    . Omega-3 Fatty Acids (OMEGA 3 PO) Take 520 mg by mouth every other day.     . silver sulfADIAZINE (SILVADENE) 1 % cream Apply 1 application topically 2 (two) times daily. 50 g 0  . sodium chloride (OCEAN) 0.65 % SOLN nasal spray Place 1 spray into both nostrils daily as needed for congestion.    . sulfamethoxazole-trimethoprim (BACTRIM DS,SEPTRA DS) 800-160 MG tablet Take 1  tablet by mouth 2 (two) times daily. Take 1 bid 28 tablet 1   No current facility-administered medications for this visit.    Allergies:  Patient has no known allergies.   Social History: The patient  reports that she has never smoked. She has never used smokeless tobacco. She reports that she does not drink alcohol or use drugs.   ROS:  Please see the history of present illness. Otherwise, complete review of systems is positive for none.  She does have some hypersensitivity to her sternal scar at times.  All other systems are reviewed and negative.   Physical Exam: VS:  BP (!) 157/80 (BP Location: Left Arm)   Pulse 62   Temp 98.3 F (36.8 C)   Ht 5\' 4"  (1.626 m)   Wt 204 lb (92.5 kg)   SpO2 98%   BMI 35.02 kg/m , BMI Body mass index is 35.02 kg/m.  Wt Readings from Last 3 Encounters:  03/17/19 204 lb (92.5 kg)  10/09/18 200 lb 8 oz (90.9 kg)  06/24/18 195 lb (88.5 kg)    General: Patient appears comfortable at rest. HEENT: Conjunctiva and lids normal, oropharynx clear. Neck: Supple, no elevated JVP or carotid bruits, no thyromegaly. Thorax: Well-healed sternal incision. Lungs: Clear to auscultation, nonlabored breathing at rest. Cardiac: Regular rate and rhythm, no S3 or significant systolic murmur, no pericardial rub. Abdomen: Soft, nontender, bowel sounds present. Extremities: No pitting edema, distal pulses 2+. Skin: Warm and dry. Musculoskeletal: No kyphosis. Neuropsychiatric: Alert and oriented x3, affect grossly appropriate.  ECG:  An ECG dated 02/21/2018 was personally reviewed today and demonstrated:  Sinus rhythm with nonspecific T wave changes.  Recent Labwork:    Component Value Date/Time   CHOL 132 02/20/2018 0447   TRIG 143 02/20/2018 0447   HDL 46 02/20/2018 0447   CHOLHDL 2.9 02/20/2018 0447   VLDL 29 02/20/2018 0447   LDLCALC 57 02/20/2018 0447  August 2019: BUN 16, creatinine 0.83, potassium 3.8, AST 20, ALT 21, cholesterol 112, triglycerides 124,  HDL 42, LDL 45, hemoglobin A1c 5.7%, hemoglobin 11.8, platelets 314  Other Studies Reviewed Today:  Cardiac catheterization 02/18/2018:  Ost LAD to Prox LAD lesion is 95% stenosed. Prox LAD lesion is 80% stenosed.  Ost Ramus to Ramus lesion is 90% stenosed. Ramus lesion is 45% stenosed. Lat Ramus lesion is 100% stenosed.  Prox Cx lesion is 85% stenosed. Mid Cx lesion is 70% stenosed.  1st RPLB lesion is 95% stenosed.  The left ventricular systolic function is normal. The left ventricular ejection fraction is 55-65% by visual estimate.  LV end diastolic pressure is mildly elevated.   Severe Multivessel CAD with Class III Angina  Normal LVEF with mildly elevated  LVEDP  Echocardiogram 01/23/2018: Study Conclusions  - Left ventricle: The cavity size was normal. Wall thickness was increased in a pattern of mild LVH. Systolic function was normal. The estimated ejection fraction was in the range of 55% to 60%. Wall motion was normal; there were no regional wall motion abnormalities. - Aortic valve: Mildly to moderately calcified annulus. Trileaflet; mildly calcified leaflets. - Mitral valve: There was trivial regurgitation. - Right atrium: Central venous pressure (est): 3 mm Hg. - Atrial septum: No defect or patent foramen ovale was identified. - Tricuspid valve: There was trivial regurgitation. - Pulmonary arteries: Systolic pressure could not be accurately estimated. - Pericardium, extracardiac: A prominent pericardial fat pad was present.  Assessment and Plan:  1.  Multivessel CAD status post CABG in June 2019.  She is clinically stable at this time.  Continue aspirin and statin therapy, also on beta-blocker and ARB.  Recommended regular walking plan, although at this time she is fairly busy, up on her feet working 10-hour days this summer.  Routine follow-up scheduled.  2.  Mixed hyperlipidemia.  She continues on Lipitor and is due for follow-up lipid panel  with PCP in early August.  Last LDL was 45.  3.  Essential hypertension, blood pressure is elevated today with systolic 157.  Keep follow-up with PCP.  No changes made to current medications.  4.  Type 2 diabetes mellitus, followed by Dr. Leandrew KoyanagiBurdine.  Last hemoglobin A1c was 5.7%.  She is not on insulin at this point.  Medication Adjustments/Labs and Tests Ordered: Current medicines are reviewed at length with the patient today.  Concerns regarding medicines are outlined above.   Tests Ordered: Orders Placed This Encounter  Procedures  . EKG 12-Lead    Medication Changes: No orders of the defined types were placed in this encounter.   Disposition:  Follow up 6 months in the [place] office.  Signed, Jonelle SidleSamuel G. Tatym Schermer, MD, [facility] 03/17/2019 8:44 AM    [facility] [address], [place], [place] [address] Phone: [phone]; Fax: [phone]

## 2019-04-15 ENCOUNTER — Ambulatory Visit (INDEPENDENT_AMBULATORY_CARE_PROVIDER_SITE_OTHER): Payer: BC Managed Care – PPO | Admitting: Adult Health

## 2019-04-15 ENCOUNTER — Other Ambulatory Visit: Payer: Self-pay

## 2019-04-15 ENCOUNTER — Encounter: Payer: Self-pay | Admitting: Adult Health

## 2019-04-15 VITALS — BP 149/76 | HR 62 | Ht 63.5 in | Wt 201.0 lb

## 2019-04-15 DIAGNOSIS — Z1211 Encounter for screening for malignant neoplasm of colon: Secondary | ICD-10-CM | POA: Diagnosis not present

## 2019-04-15 DIAGNOSIS — Z1212 Encounter for screening for malignant neoplasm of rectum: Secondary | ICD-10-CM | POA: Diagnosis not present

## 2019-04-15 DIAGNOSIS — Z01419 Encounter for gynecological examination (general) (routine) without abnormal findings: Secondary | ICD-10-CM | POA: Insufficient documentation

## 2019-04-15 LAB — HEMOCCULT GUIAC POC 1CARD (OFFICE): Fecal Occult Blood, POC: NEGATIVE

## 2019-04-15 NOTE — Progress Notes (Signed)
Patient ID: Leslie Cobb, female   DOB: 1956/05/08, 63 y.o.   MRN: [id number] History of Present Illness: Leslie Cobb is a 63 year old white female, PM in for a well woman gyn exam, she had a normal pap with negative HPV 12/12/17.She has seen her cardiologist recently and has labs tomorrow. She is still working but plans on retiring in March 2021.  PCP is Dr Pleas Koch.   Current Medications, Allergies, Past Medical History, Past Surgical History, Family History and Social History were reviewed in [employer] record.     Review of Systems:  Patient denies any headaches, hearing loss, fatigue, blurred vision, shortness of breath, chest pain, abdominal pain, problems with bowel movements, urination, or intercourse(not having sex). No joint pain or mood swings. No bleeding   Physical Exam:BP (!) 149/76 (BP Location: Left Arm, Patient Position: Sitting, Cuff Size: Normal)   Pulse 62   Ht 5' 3.5" (1.613 m)   Wt 201 lb (91.2 kg)   BMI 35.05 kg/m  General:  Well developed, well nourished, no acute distress Skin:  Warm and dry Neck:  Midline trachea, normal thyroid, good ROM, no lymphadenopathy,no carotid bruits heard  Lungs; Clear to auscultation bilaterally Breast:  No dominant palpable mass, retraction, or nipple discharge Cardiovascular: Regular rate and rhythm Abdomen:  Soft, non tender, no hepatosplenomegaly Pelvic:  External genitalia is normal in appearance, no lesions,but scarring from prior boils.  The vagina is pale with loss of moisture and rugae. Urethra has no lesions or masses. The cervix is smooth.  Uterus is felt to be normal size, shape, and contour.  No adnexal masses or tenderness noted.Bladder is non tender, no masses felt. Rectal: Good sphincter tone, no polyps, or hemorrhoids felt.  Hemoccult negative. Extremities/musculoskeletal:  No swelling or varicosities noted, no clubbing or cyanosis Psych:  No mood changes, alert and cooperative,seems happy Fall  risk is low. PHQ 2 score is 0 Examination chaperoned by Levy Pupa LPN.  Impression: 1. Encounter for well woman exam with routine gynecological exam   2. Screening for colorectal cancer       Plan: Physical in 1 year Pap in 2022 Mammogram yearly gets in [facility] tomorrow with PCP

## 2019-04-30 IMAGING — DX DG CHEST 1V PORT
1 series · 1 of 1 positions shown · non-contrast
Comparison: 02/19/2018

CLINICAL DATA: Status post CABG.

EXAM:
PORTABLE CHEST 1 VIEW

[chest]
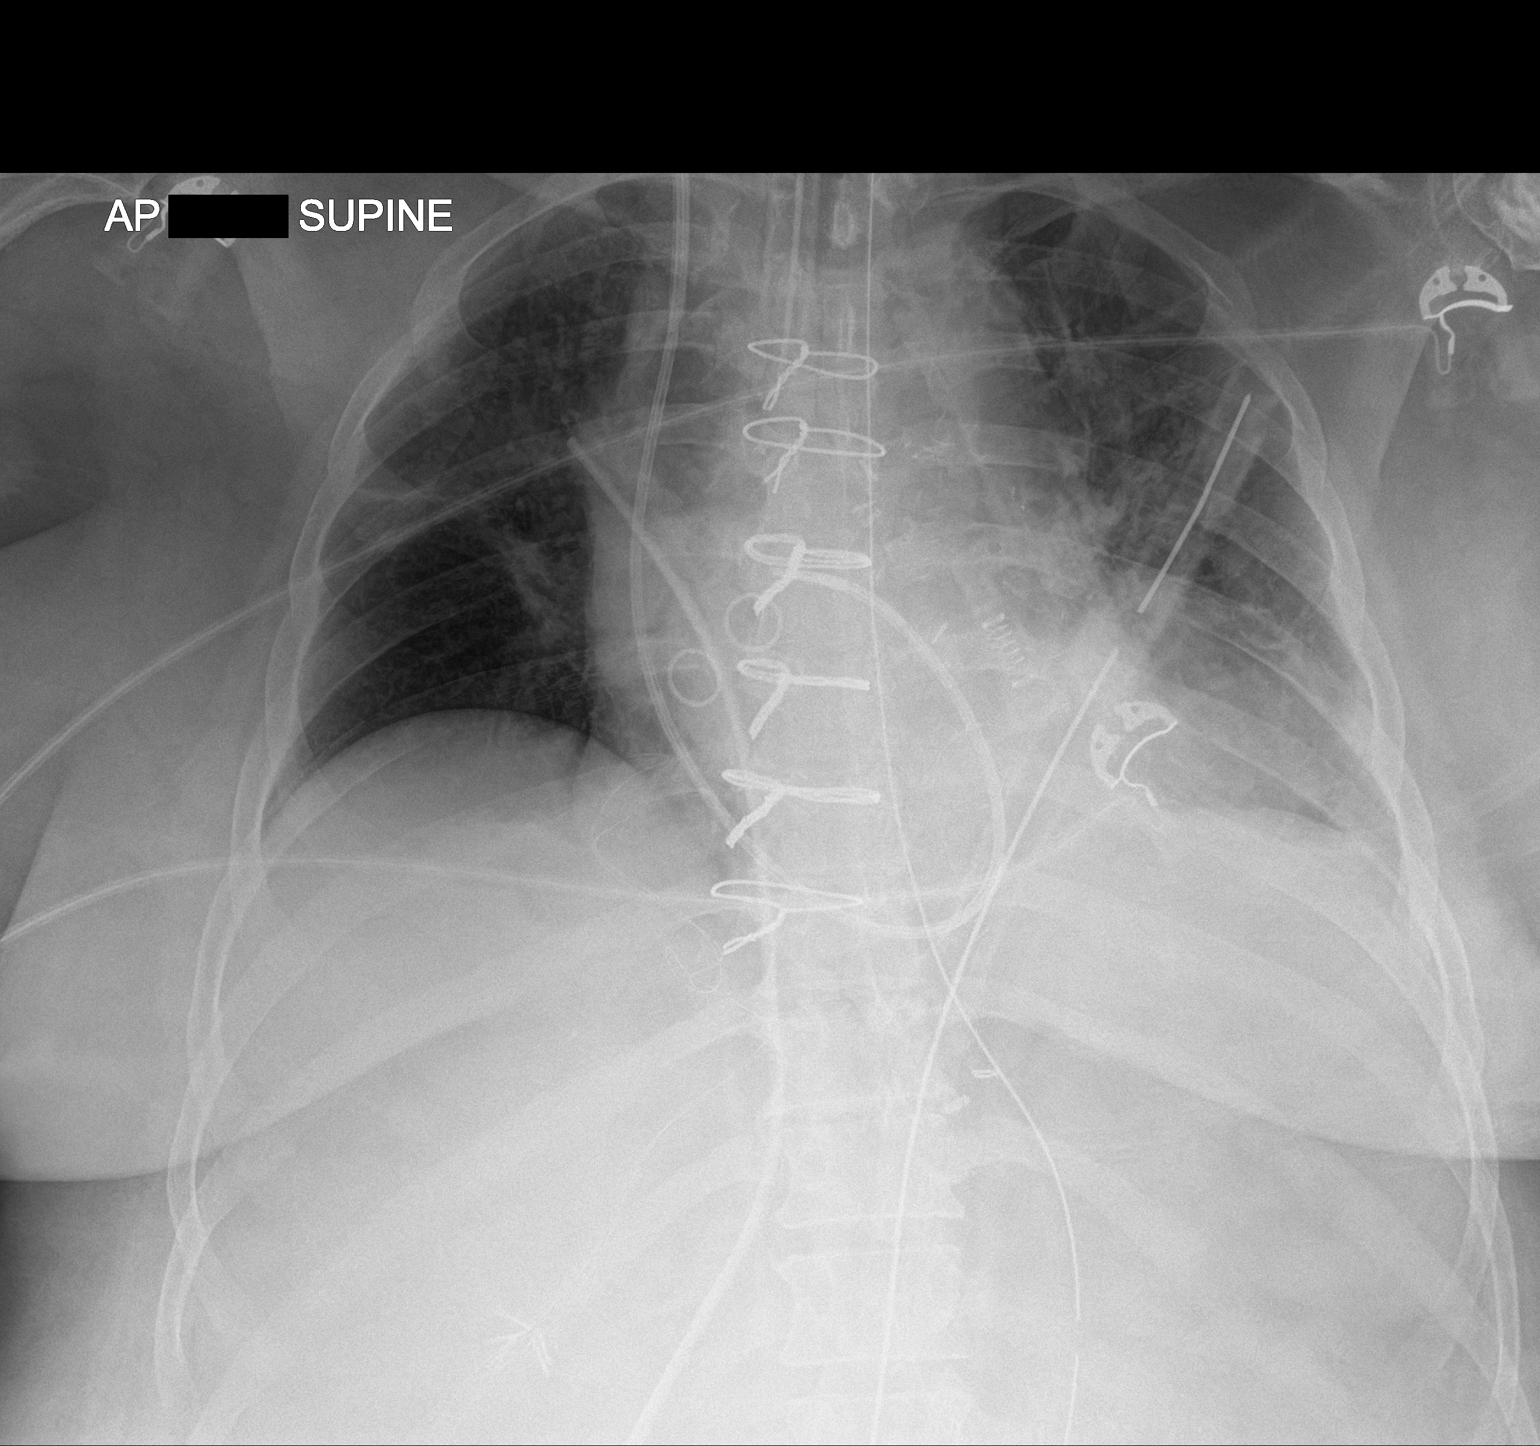

[1 of 1 positions shown; findings below may reference images not displayed]

FINDINGS: 3458 hours. Endotracheal tube tip is approximately 14 mm above the
base of the carina. The NG tube passes into the stomach although the
distal tip position is not included on the film. Right IJ pulmonary
artery catheter tip is in the right main pulmonary artery. Right
Thoracic drain overlies the right heart border. Left chest tube
evident. No pneumothorax. Atelectasis noted in the lungs
bilaterally, left greater than right. The cardio pericardial
silhouette is enlarged. Telemetry leads overlie the chest.
IMPRESSION: 1. Low volume film with bilateral atelectasis.
2. No evidence for pneumothorax.

## 2019-05-01 IMAGING — CR DG CHEST 1V PORT
1 series · 1 of 1 positions shown · non-contrast
Comparison: 02/20/2018

CLINICAL DATA: Post CABG

EXAM:
PORTABLE CHEST 1 VIEW

[AP]
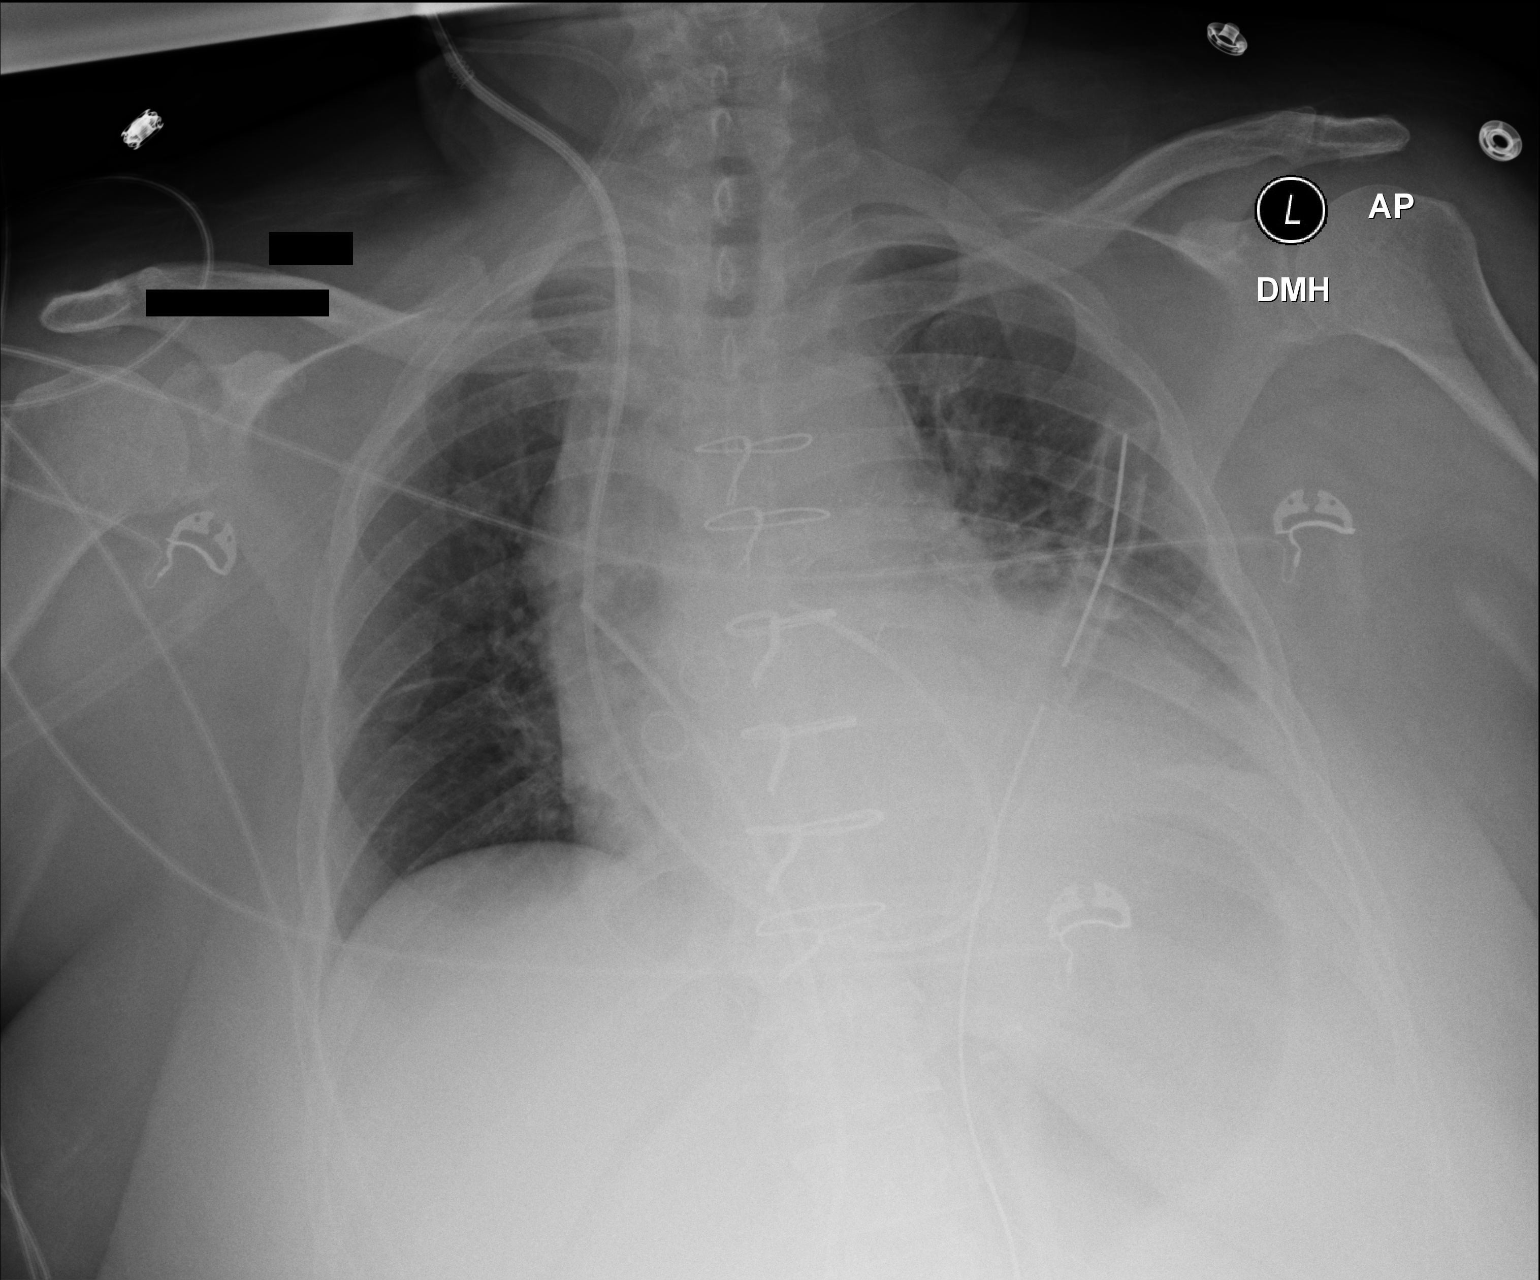

[1 of 1 positions shown; findings below may reference images not displayed]

FINDINGS: Interval removal of endotracheal tube and NG tube. Left chest tube
and Swan-Ganz catheter remain in place, unchanged. No pneumothorax.
Cardiomegaly with vascular congestion and left base atelectasis.
IMPRESSION: Interval extubation.

Cardiomegaly, vascular congestion and left base atelectasis.

## 2019-05-02 IMAGING — DX DG CHEST 1V PORT
1 series · 1 of 1 positions shown · non-contrast
Comparison: 02/21/2018.

CLINICAL DATA: Sore chest.  CABG.

EXAM:
PORTABLE CHEST 1 VIEW

[chest ap]
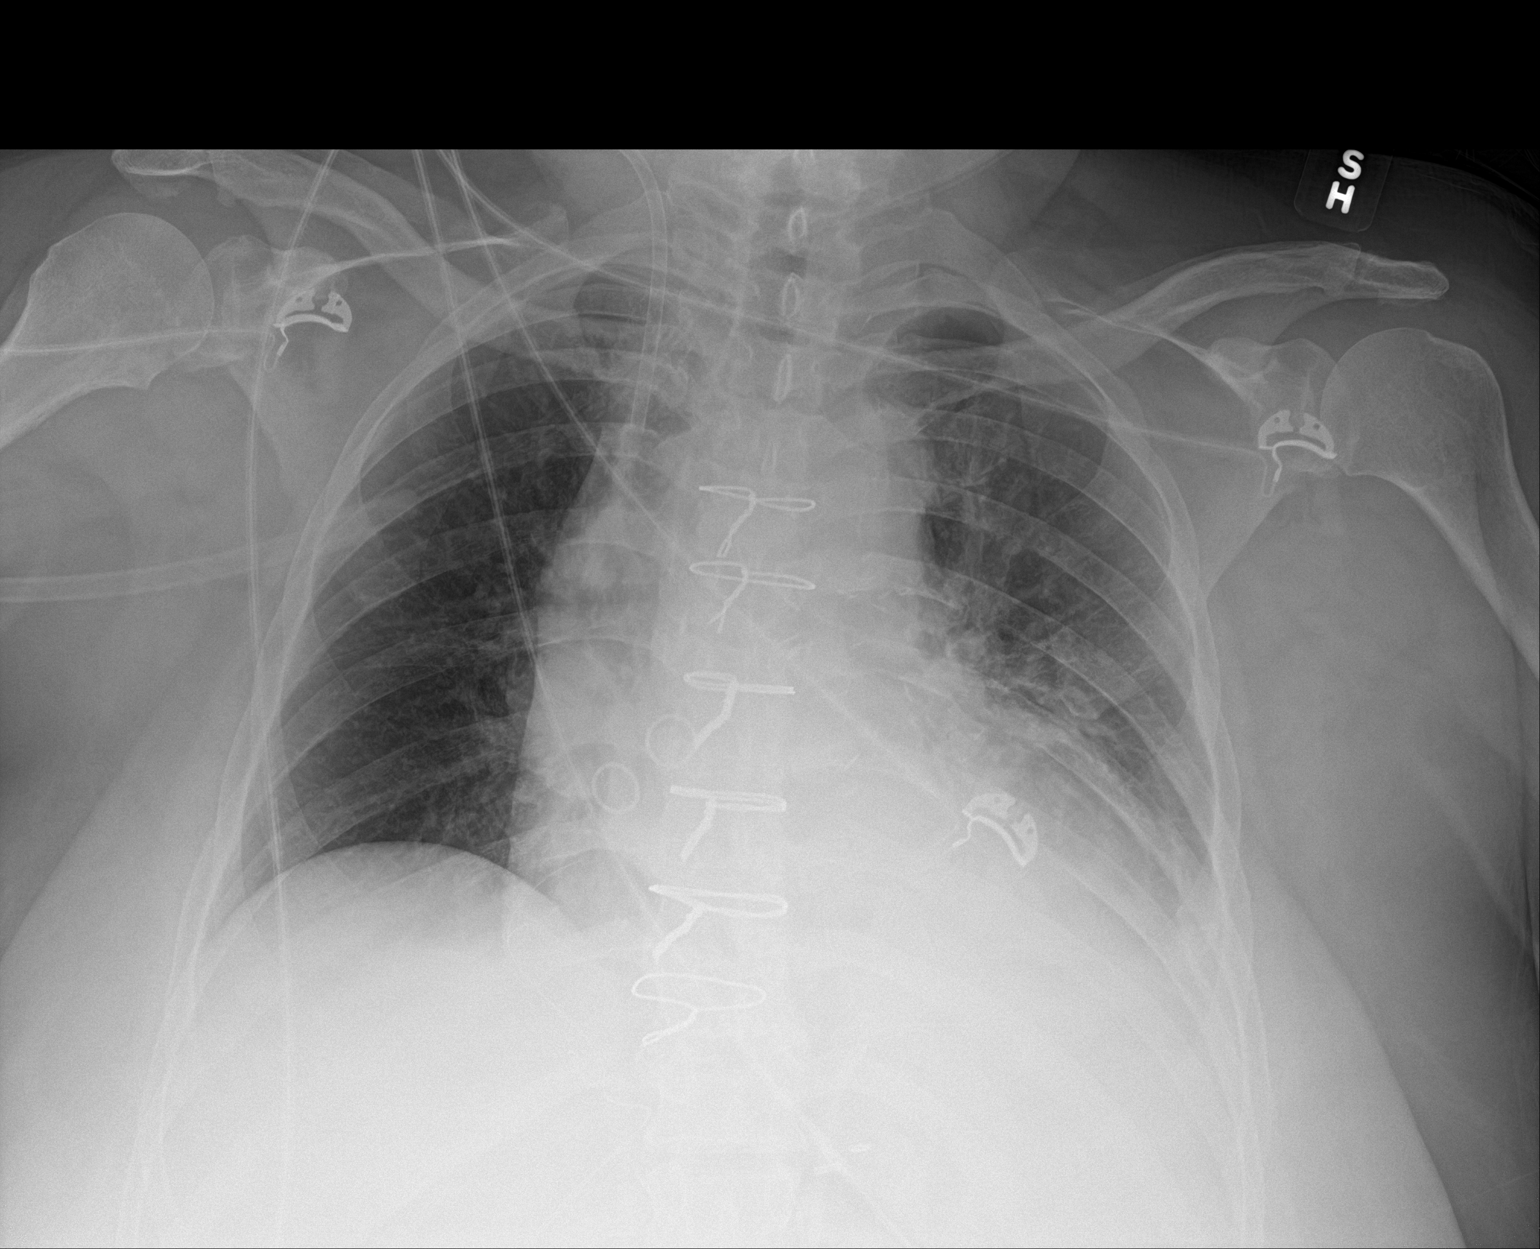

[1 of 1 positions shown; findings below may reference images not displayed]

FINDINGS: Interim removal of Swan-Ganz catheter and left chest tube. Right IJ
sheath in stable position. Prior CABG. Cardiomegaly with normal
pulmonary vascularity. Left lower lobe atelectasis/infiltrate with
small left pleural effusion. No pneumothorax.
IMPRESSION: 1. Interim removal of Swan-Ganz catheter left chest tube. A sheath
in stable position. No pneumothorax.

2. Persistent left lower lobe atelectasis/infiltrate and small left
pleural effusion. No interim change.

3. Prior CABG. Stable cardiomegaly. No pulmonary venous congestion.

## 2019-05-03 IMAGING — CR DG CHEST 2V
2 series · 2 of 2 positions shown · non-contrast
Comparison: 02/23/2012

CLINICAL DATA: Post CABG

EXAM:
CHEST - 2 VIEW

[chest pa]
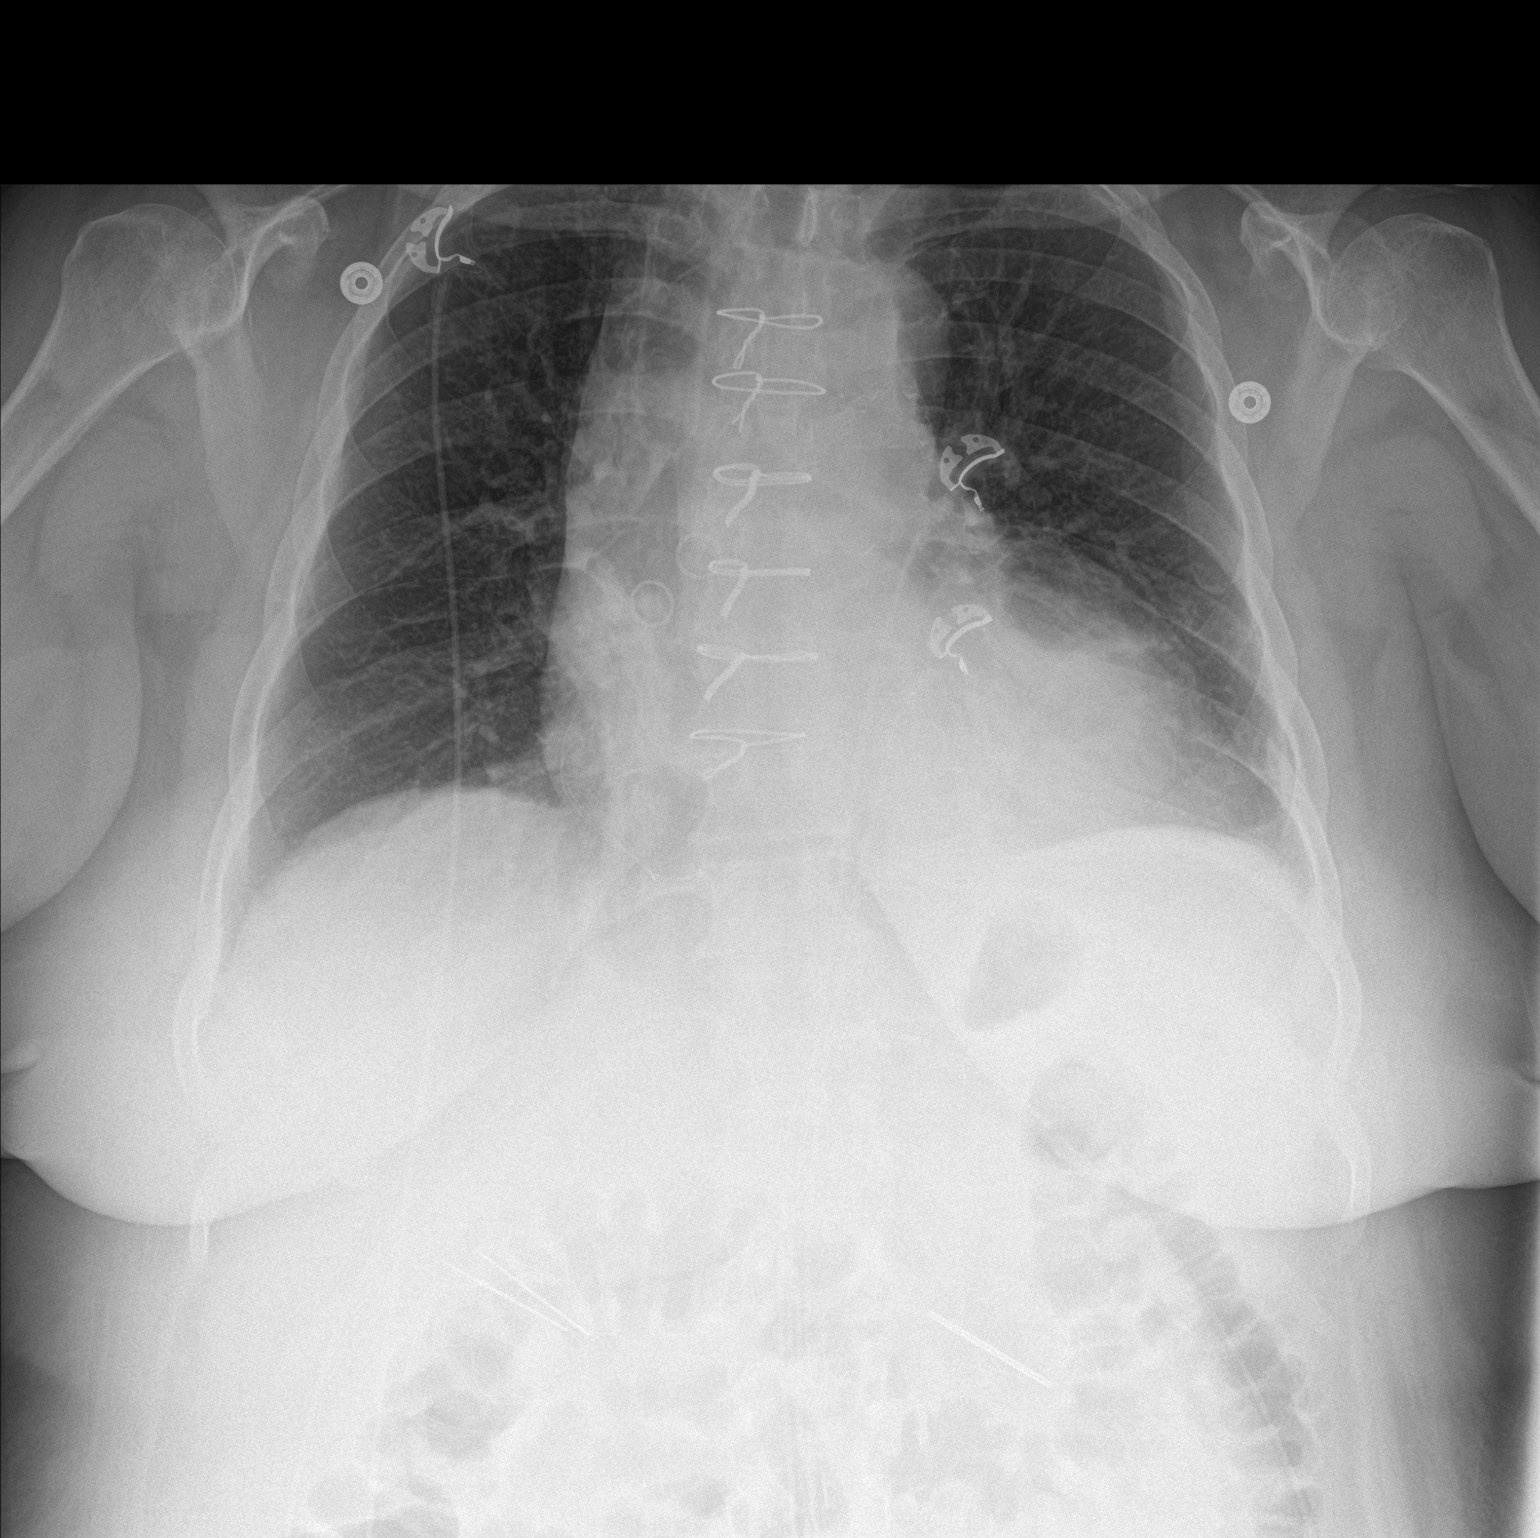

[chest lat]
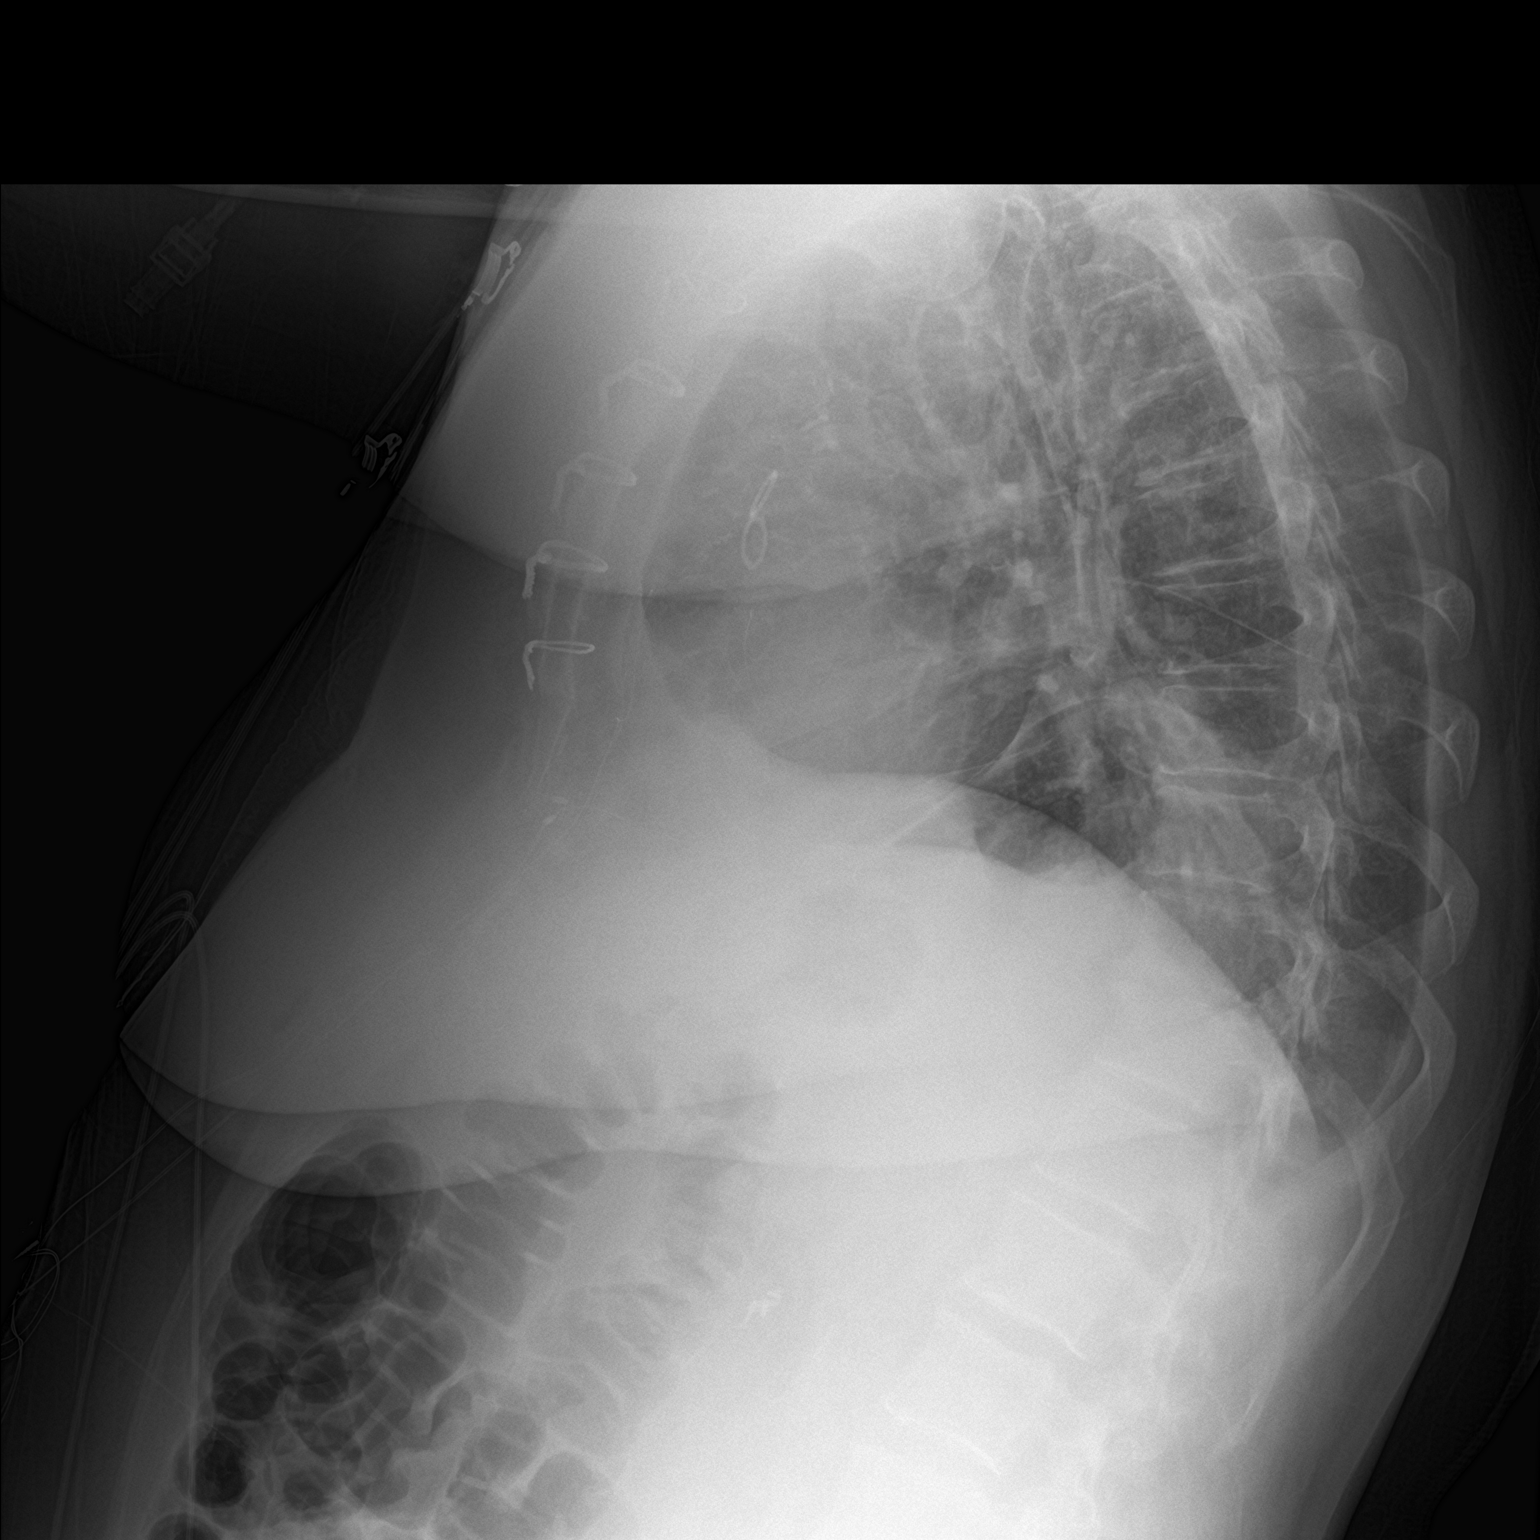

[2 of 2 positions shown; findings below may reference images not displayed]

FINDINGS: Changes of CABG. Cardiomegaly. Hiatal hernia present. Left base
atelectasis or infiltrate, improving since prior study. No focal
opacity on the right. No effusions or acute bony abnormality.
IMPRESSION: Left base atelectasis or infiltrate, improving since prior study.

Cardiomegaly, hiatal hernia.

## 2019-08-12 ENCOUNTER — Encounter: Payer: Self-pay | Admitting: *Deleted

## 2019-08-12 ENCOUNTER — Telehealth: Payer: Self-pay | Admitting: Cardiology

## 2019-08-12 NOTE — Telephone Encounter (Signed)
Pt made aware that this communication was sent - pt appreciative

## 2019-08-12 NOTE — Telephone Encounter (Signed)
Thank you for clarifying.  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  She follows in our practice with a history of CAD status post coronary artery bypass grafting in June 2019.  She has done well subsequently on medical therapy and has normal LVEF without any history of syncope or hemodynamically significant arrhythmias.  Unless she has had any substantial change in cardiac status since our last evaluation, I do not anticipate any specific driving restrictions at this time.

## 2019-08-12 NOTE — Telephone Encounter (Signed)
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

## 2019-08-12 NOTE — Telephone Encounter (Signed)
I am going to need more information than that.  Is this a DOT issue?  Who has requested the information?  What type of paperwork provided?

## 2019-08-12 NOTE — Telephone Encounter (Signed)
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

## 2019-09-22 ENCOUNTER — Other Ambulatory Visit: Payer: BC Managed Care – PPO

## 2019-09-25 ENCOUNTER — Telehealth: Payer: Self-pay | Admitting: Cardiology

## 2019-09-25 ENCOUNTER — Ambulatory Visit: Payer: BC Managed Care – PPO | Attending: Internal Medicine

## 2019-09-25 ENCOUNTER — Other Ambulatory Visit: Payer: Self-pay

## 2019-09-25 DIAGNOSIS — Z20822 Contact with and (suspected) exposure to covid-19: Secondary | ICD-10-CM

## 2019-09-25 NOTE — Telephone Encounter (Signed)

## 2019-09-27 LAB — NOVEL CORONAVIRUS, NAA: SARS-CoV-2, NAA: NOT DETECTED

## 2019-09-29 NOTE — Progress Notes (Signed)
Virtual Visit via Telephone Note   This visit type was conducted due to national recommendations for restrictions regarding the COVID-19 Pandemic (e.g. social distancing) in an effort to limit this patient's exposure and mitigate transmission in our community.  Due to her co-morbid illnesses, this patient is at least at moderate risk for complications without adequate follow up.  This format is felt to be most appropriate for this patient at this time.  The patient did not have access to video technology/had technical difficulties with video requiring transitioning to audio format only (telephone).  All issues noted in this document were discussed and addressed.  No physical exam could be performed with this format.  Please refer to the patient's chart for her  consent to telehealth for [facility].   Date:  09/30/2019   ID:  Leslie Cobb, DOB Aug 13, 1956, MRN [id number]  Patient Location: Home Provider Location: Office  PCP:  Curlene Labrum, MD  Cardiologist:  Rozann Lesches, MD Electrophysiologist:  None   Evaluation Performed:  Follow-Up Visit  Chief Complaint:  Cardiac follow-up  History of Present Illness:    Leslie Cobb is a 64 y.o. female last seen in July 2020.  We spoke by phone today.  From a cardiac perspective, she does not report any obvious angina symptoms on medical therapy.  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  She tells me that she plans to retire at the end of February.  She has had some recent symptoms, following with her PCP.  She reports having sinus pressure and drainage, transient fever, also boils on her skin.  She has been tested twice for COVID-19 with negative results.  Currently on antibiotics.  I reviewed her medications.  Cardiac regimen includes aspirin, Lipitor, Cozaar, HCTZ, and Lopressor.  Past Medical History:  Diagnosis Date  . CAD (coronary artery disease)    a. 02/2018: cath  due to unstable angina --> showed multivessel CAD with CABG recommended. b. 02/20/2018: s/p CABG with LIMA-LAD, Seq SVG-Ramus 1 and 2, and SVG-PLB   . Hyperlipidemia   . Hypertension   . Low back pain 08/06/2013  . Obesity   . Type 2 diabetes mellitus ([place])   . Vaginal Pap smear, abnormal    Past Surgical History:  Procedure Laterality Date  . BREAST SURGERY Left   . CORONARY ARTERY BYPASS GRAFT N/A 02/20/2018   Procedure: CORONARY ARTERY BYPASS GRAFTING (CABG) x 4 (LIMA to LAD, SVG SEQUENTIALLY to RAMUS 1 and 2, SVG to PLB) with EVH from [place] and LEFT INTERNAL MAMMARY ARTERY HARVEST;  Surgeon: Melrose Nakayama, MD;  Location: [place];  Service: Open Heart Surgery;  Laterality: N/A;  . GALLBLADDER SURGERY    . LEFT HEART CATH AND CORONARY ANGIOGRAPHY N/A 02/18/2018   Procedure: LEFT HEART CATH AND CORONARY ANGIOGRAPHY;  Surgeon: Leonie Man, MD;  Location: [place] [facility];  Service: Cardiovascular;  Laterality: N/A;  . TEE WITHOUT CARDIOVERSION N/A 02/20/2018   Procedure: TRANSESOPHAGEAL ECHOCARDIOGRAM (TEE);  Surgeon: Melrose Nakayama, MD;  Location: [place];  Service: Open Heart Surgery;  Laterality: N/A;     Current Meds  Medication Sig  . acetaminophen (TYLENOL) 500 MG tablet Take 2 tablets (1,000 mg total) by mouth every 8 (eight) hours as needed for mild pain.  Marland Kitchen aspirin 81 MG tablet Take 1 tablet (81 mg total) by mouth daily.  Marland Kitchen atorvastatin (LIPITOR) 80 MG tablet Take  1 tablet (80 mg total) by mouth daily at 6 PM.  . Cholecalciferol (VITAMIN D-3) 1000 units CAPS Take 1,000 Units by mouth daily.   . Cyanocobalamin (VITAMIN B 12 PO) Take 1,000 mcg by mouth 2 (two) times a week. Saturday and Sunday  . fluticasone (FLONASE) 50 MCG/ACT nasal spray USE 2 SPRAY(S) IN EACH NOSTRIL ONCE DAILY  . GLIPIZIDE XL 10 MG 24 hr tablet Take 10 mg by mouth at bedtime.  . hydrochlorothiazide (HYDRODIURIL) 25 MG tablet Take 25 mg by mouth daily.  Marland Kitchen losartan  (COZAAR) 50 MG tablet Take 50 mg by mouth at bedtime.  Marland Kitchen MELATONIN PO Take by mouth at bedtime.  . metFORMIN (GLUCOPHAGE-XR) 500 MG 24 hr tablet Take 750 mg by mouth 2 (two) times daily.  . metoprolol tartrate (LOPRESSOR) 25 MG tablet Take 1 tablet (25 mg total) by mouth 2 (two) times daily.  . Multiple Vitamin (MULTIVITAMIN) tablet Take 1 tablet by mouth daily.  . Omega-3 Fatty Acids (OMEGA 3 PO) Take 520 mg by mouth every other day.   . silver sulfADIAZINE (SILVADENE) 1 % cream Apply 1 application topically 2 (two) times daily.  . sodium chloride (OCEAN) 0.65 % SOLN nasal spray Place 1 spray into both nostrils daily as needed for congestion.  . sulfamethoxazole-trimethoprim (BACTRIM DS,SEPTRA DS) 800-160 MG tablet Take 1 tablet by mouth 2 (two) times daily. Take 1 bid (Patient taking differently: Take 1 tablet by mouth 2 (two) times daily. Take 1 bid prn)  . vitamin C (ASCORBIC ACID) 250 MG tablet Take 250 mg by mouth daily.  . [DISCONTINUED] losartan (COZAAR) 100 MG tablet Take 1 tablet (100 mg total) by mouth at bedtime. (Patient taking differently: Take 50 mg by mouth at bedtime. )  . [DISCONTINUED] metFORMIN (GLUCOPHAGE) 500 MG tablet Take by mouth 2 (two) times daily. Takes 1.5 tabs in the am and 1.5 in the pm     Allergies:   Patient has no known allergies.   Social History   Tobacco Use  . Smoking status: Never Smoker  . Smokeless tobacco: Never Used  Substance Use Topics  . Alcohol use: Yes    Comment: wine at night  . Drug use: No     Family Hx: [name] history includes Arthritis in her mother; Diabetes in her maternal aunt, maternal grandmother, and mother; Heart disease in her brother, brother, brother, father, and mother; Hypertension in her brother and mother; Kidney disease in her mother; Lung cancer in her father.  ROS:   Please see the history of present illness. All other systems reviewed and are negative.   Prior CV studies:   The following studies  were reviewed today:  Cardiac catheterization 02/18/2018:  Ost LAD to Prox LAD lesion is 95% stenosed. Prox LAD lesion is 80% stenosed.  Ost Ramus to Ramus lesion is 90% stenosed. Ramus lesion is 45% stenosed. Lat Ramus lesion is 100% stenosed.  Prox Cx lesion is 85% stenosed. Mid Cx lesion is 70% stenosed.  1st RPLB lesion is 95% stenosed.  The left ventricular systolic function is normal. The left ventricular ejection fraction is 55-65% by visual estimate.  LV end diastolic pressure is mildly elevated.   Severe Multivessel CAD with Class III Angina  Normal LVEF with mildly elevated LVEDP  Echocardiogram 01/23/2018: Study Conclusions  - Left ventricle: The cavity size was normal. Wall thickness was increased in a pattern of mild LVH. Systolic function was normal. The estimated ejection fraction was in the range of  55% to 60%. Wall motion was normal; there were no regional wall motion abnormalities. - Aortic valve: Mildly to moderately calcified annulus. Trileaflet; mildly calcified leaflets. - Mitral valve: There was trivial regurgitation. - Right atrium: Central venous pressure (est): 3 mm Hg. - Atrial septum: No defect or patent foramen ovale was identified. - Tricuspid valve: There was trivial regurgitation. - Pulmonary arteries: Systolic pressure could not be accurately estimated. - Pericardium, extracardiac: A prominent pericardial fat pad was present.  Labs/Other Tests and Data Reviewed:    EKG:  An ECG dated 03/17/2019 was personally reviewed today and demonstrated:  Sinus rhythm with low voltage, decreased R wave progression, nonspecific T wave changes.  Recent Labs:  August 2019: BUN 16, creatinine 0.83, potassium 3.8, AST 20, ALT 21, cholesterol 112, triglycerides 124, HDL 42, LDL 45, hemoglobin A1c 5.7%, hemoglobin 11.8, platelets 314 August 2020: BUN 17, creatinine 0.85, potassium 4.3, AST 32, ALT 40, hemoglobin A1c 7.3%  Wt Readings from  Last 3 Encounters:  09/30/19 197 lb (89.4 kg)  04/15/19 201 lb (91.2 kg)  03/17/19 204 lb (92.5 kg)     Objective:    Vital Signs:  Ht 5\' 4"  (1.626 m)   Wt 197 lb (89.4 kg)   BMI 33.81 kg/m    Patient spoke in full sentences, not short of breath. No audible wheezing or coughing. Speech pattern normal.  ASSESSMENT & PLAN:    1.  Multivessel CAD status post CABG in June 2019.  Plan is to continue medical therapy, no angina at this time.  She is on aspirin, Lipitor, Cozaar, and Lopressor.  2.  Mixed hyperlipidemia, continues on Lipitor.  LFTs normal as of August 2020 with previous LDL 45.  3.  Uncontrolled type 2 diabetes mellitus.  Last hemoglobin A1c 7.3%.  She is on glipizide XL and Glucotrol XR with follow-up by Dr. September 2020.  4.  Essential hypertension, no changes made to present regimen.  COVID-19 Education: The signs and symptoms of COVID-19 were discussed with the patient and how to seek care for testing (follow up with PCP or arrange E-visit).  The importance of social distancing was discussed today.  Time:   Today, I have spent 10 minutes with the patient with telehealth technology discussing the above problems.     Medication Adjustments/Labs and Tests Ordered: Current medicines are reviewed at length with the patient today.  Concerns regarding medicines are outlined above.   Tests Ordered: No orders of the defined types were placed in this encounter.   Medication Changes: No orders of the defined types were placed in this encounter.   Follow Up:  In Person 6 months in the [place] office.  Signed, [place], MD  09/30/2019 10:52 AM    [facility]

## 2019-09-30 ENCOUNTER — Encounter: Payer: Self-pay | Admitting: Cardiology

## 2019-09-30 ENCOUNTER — Telehealth (INDEPENDENT_AMBULATORY_CARE_PROVIDER_SITE_OTHER): Payer: BC Managed Care – PPO | Admitting: Cardiology

## 2019-09-30 VITALS — Ht 64.0 in | Wt 197.0 lb

## 2019-09-30 DIAGNOSIS — E782 Mixed hyperlipidemia: Secondary | ICD-10-CM | POA: Diagnosis not present

## 2019-09-30 DIAGNOSIS — I10 Essential (primary) hypertension: Secondary | ICD-10-CM | POA: Diagnosis not present

## 2019-09-30 DIAGNOSIS — I25119 Atherosclerotic heart disease of native coronary artery with unspecified angina pectoris: Secondary | ICD-10-CM | POA: Diagnosis not present

## 2019-09-30 DIAGNOSIS — E1165 Type 2 diabetes mellitus with hyperglycemia: Secondary | ICD-10-CM

## 2019-09-30 NOTE — Patient Instructions (Addendum)

## 2019-11-16 ENCOUNTER — Ambulatory Visit: Payer: BC Managed Care – PPO | Attending: Internal Medicine

## 2019-11-16 DIAGNOSIS — Z23 Encounter for immunization: Secondary | ICD-10-CM | POA: Insufficient documentation

## 2019-11-16 NOTE — Progress Notes (Signed)
   Covid-19 Vaccination Clinic  Name:  Aidaly Cordner    MRN: [id number] DOB: [birth year]  11/16/2019  Ms. Lippard was observed post Covid-19 immunization for 15 minutes without incident. She was provided with Vaccine Information Sheet and instruction to access the V-Safe system.   Ms. Perrier was instructed to call 911 with any severe reactions post vaccine: Marland Kitchen Difficulty breathing  . Swelling of face and throat  . A fast heartbeat  . A bad rash all over body  . Dizziness and weakness   Immunizations Administered    Name Date Dose VIS Date Route   Pfizer COVID-19 Vaccine 11/16/2019 11:13 AM 0.3 mL 08/22/2019 Intramuscular   Manufacturer: [employer], [employer]   Lot: [id number]   NDC: [address]-2

## 2019-12-07 ENCOUNTER — Other Ambulatory Visit: Payer: Self-pay

## 2019-12-07 ENCOUNTER — Ambulatory Visit: Payer: BC Managed Care – PPO | Attending: Internal Medicine

## 2019-12-07 DIAGNOSIS — Z23 Encounter for immunization: Secondary | ICD-10-CM

## 2019-12-07 NOTE — Progress Notes (Signed)
   Covid-19 Vaccination Clinic  Name:  Leslie Cobb    MRN: [id number] DOB: January 01, 1956  12/07/2019  [name] was observed post Covid-19 immunization for 15 minutes without incident. She was provided with Vaccine Information Sheet and instruction to access the V-Safe system.   [name] was instructed to call 911 with any severe reactions post vaccine: Marland Kitchen Difficulty breathing  . Swelling of face and throat  . A fast heartbeat  . A bad rash all over body  . Dizziness and weakness   Immunizations Administered    Name Date Dose VIS Date Route   Pfizer COVID-19 Vaccine 12/07/2019 10:23 AM 0.3 mL 08/22/2019 Intramuscular   Manufacturer: [employer], [employer]   Lot: [id number]   NDC: [address]-1

## 2020-01-15 ENCOUNTER — Other Ambulatory Visit: Payer: Self-pay

## 2020-01-15 ENCOUNTER — Ambulatory Visit: Payer: BC Managed Care – PPO | Attending: Internal Medicine

## 2020-01-15 DIAGNOSIS — Z20822 Contact with and (suspected) exposure to covid-19: Secondary | ICD-10-CM

## 2020-01-16 LAB — NOVEL CORONAVIRUS, NAA: SARS-CoV-2, NAA: NOT DETECTED

## 2020-01-16 LAB — SARS-COV-2, NAA 2 DAY TAT

## 2020-03-29 ENCOUNTER — Encounter: Payer: Self-pay | Admitting: Cardiology

## 2020-03-29 ENCOUNTER — Telehealth (INDEPENDENT_AMBULATORY_CARE_PROVIDER_SITE_OTHER): Payer: BC Managed Care – PPO | Admitting: Cardiology

## 2020-03-29 VITALS — BP 145/92 | Ht 64.0 in | Wt 190.0 lb

## 2020-03-29 DIAGNOSIS — E782 Mixed hyperlipidemia: Secondary | ICD-10-CM

## 2020-03-29 DIAGNOSIS — I25119 Atherosclerotic heart disease of native coronary artery with unspecified angina pectoris: Secondary | ICD-10-CM

## 2020-03-29 NOTE — Progress Notes (Signed)
Virtual Visit via Telephone Note   This visit type was conducted due to national recommendations for restrictions regarding the COVID-19 Pandemic (e.g. social distancing) in an effort to limit this patient's exposure and mitigate transmission in our community.  Due to her co-morbid illnesses, this patient is at least at moderate risk for complications without adequate follow up.  This format is felt to be most appropriate for this patient at this time.  The patient did not have access to video technology/had technical difficulties with video requiring transitioning to audio format only (telephone).  All issues noted in this document were discussed and addressed.  No physical exam could be performed with this format.  Please refer to the patient's chart for her  consent to telehealth for [facility].   The patient was identified using 2 identifiers.  Date:  03/29/2020   ID:  Leslie Cobb, DOB 1956-03-14, MRN [id number]  Patient Location: Home Provider Location: Office/Clinic  PCP:  Juliette Alcide, MD  Cardiologist:  Nona Dell, MD Electrophysiologist:  None   Evaluation Performed:  Follow-Up Visit  Chief Complaint:  Cardiac follow-up  History of Present Illness:    Leslie Cobb is a 64 y.o. female last assessed via telehealth encounter in January.  We spoke by phone today.  She is switched from an in office visit to a phone visit given recent symptoms of possible nephrolithiasis.  She has been having flank pain, urinary frequency, plan to see her PCP later this afternoon.  She did retire in March, previously worked as a Arboriculturist at a [employer].  States that she anticipates birth of a grandchild in October and plans to be busy with babysitting.  I reviewed her medications which are outlined below and stable from a cardiac perspective.  Past Medical History:  Diagnosis Date  . CAD (coronary artery disease)    a. 02/2018: cath due to unstable  angina --> showed multivessel CAD with CABG recommended. b. 02/20/2018: s/p CABG with LIMA-LAD, Seq SVG-Ramus 1 and 2, and SVG-PLB   . Hyperlipidemia   . Hypertension   . Low back pain 08/06/2013  . Obesity   . Type 2 diabetes mellitus (HCC)   . Vaginal Pap smear, abnormal    Past Surgical History:  Procedure Laterality Date  . BREAST SURGERY Left   . CORONARY ARTERY BYPASS GRAFT N/A 02/20/2018   Procedure: CORONARY ARTERY BYPASS GRAFTING (CABG) x 4 (LIMA to LAD, SVG SEQUENTIALLY to RAMUS 1 and 2, SVG to PLB) with EVH from RIGHT GREATER SAPHENOUS VEIN and LEFT INTERNAL MAMMARY ARTERY HARVEST;  Surgeon: Loreli Slot, MD;  Location: [facility] OR;  Service: Open Heart Surgery;  Laterality: N/A;  . GALLBLADDER SURGERY    . LEFT HEART CATH AND CORONARY ANGIOGRAPHY N/A 02/18/2018   Procedure: LEFT HEART CATH AND CORONARY ANGIOGRAPHY;  Surgeon: Marykay Lex, MD;  Location: [facility] INVASIVE CV LAB;  Service: Cardiovascular;  Laterality: N/A;  . TEE WITHOUT CARDIOVERSION N/A 02/20/2018   Procedure: TRANSESOPHAGEAL ECHOCARDIOGRAM (TEE);  Surgeon: Loreli Slot, MD;  Location: [facility];  Service: Open Heart Surgery;  Laterality: N/A;     Current Meds  Medication Sig  . acetaminophen (TYLENOL) 500 MG tablet Take 2 tablets (1,000 mg total) by mouth every 8 (eight) hours as needed for mild pain.  Marland Kitchen aspirin 81 MG tablet Take 1 tablet (81 mg total) by mouth daily.  Marland Kitchen atorvastatin (LIPITOR) 80 MG tablet Take 1 tablet (80 mg total) by mouth daily  at 6 PM.  . Cholecalciferol (VITAMIN D-3) 1000 units CAPS Take 1,000 Units by mouth daily.   . Cyanocobalamin (VITAMIN B 12 PO) Take 1,000 mcg by mouth 2 (two) times a week. Saturday and Sunday  . fluticasone (FLONASE) 50 MCG/ACT nasal spray as needed.   Marland Kitchen GLIPIZIDE XL 10 MG 24 hr tablet Take 10 mg by mouth in the morning and at bedtime.   . hydrochlorothiazide (HYDRODIURIL) 25 MG tablet Take 25 mg by mouth daily.  Marland Kitchen HYDROcodone-acetaminophen (NORCO/VICODIN)  5-325 MG tablet Take 1 tablet by mouth as needed.  Marland Kitchen losartan (COZAAR) 50 MG tablet Take 50 mg by mouth at bedtime.  . Melatonin 10 MG TABS Take 1 tablet by mouth at bedtime as needed.  . metFORMIN (GLUCOPHAGE-XR) 500 MG 24 hr tablet Take 1,000 mg by mouth 2 (two) times daily.   . metoprolol tartrate (LOPRESSOR) 25 MG tablet Take 1 tablet (25 mg total) by mouth 2 (two) times daily.  . Multiple Vitamin (MULTIVITAMIN) tablet Take 1 tablet by mouth daily.  . Omega-3 Fatty Acids (OMEGA 3 PO) Take 520 mg by mouth every other day.   . silver sulfADIAZINE (SILVADENE) 1 % cream Apply 1 application topically as needed.  . sodium chloride (OCEAN) 0.65 % SOLN nasal spray Place 1 spray into both nostrils daily as needed for congestion.  . vitamin C (ASCORBIC ACID) 250 MG tablet Take 250 mg by mouth daily.     Allergies:   Patient has no known allergies.   ROS:   As outlined above.   Prior CV studies:   The following studies were reviewed today:  Cardiac catheterization 02/18/2018:  Ost LAD to Prox LAD lesion is 95% stenosed. Prox LAD lesion is 80% stenosed.  Ost Ramus to Ramus lesion is 90% stenosed. Ramus lesion is 45% stenosed. Lat Ramus lesion is 100% stenosed.  Prox Cx lesion is 85% stenosed. Mid Cx lesion is 70% stenosed.  1st RPLB lesion is 95% stenosed.  The left ventricular systolic function is normal. The left ventricular ejection fraction is 55-65% by visual estimate.  LV end diastolic pressure is mildly elevated.   Severe Multivessel CAD with Class III Angina  Normal LVEF with mildly elevated LVEDP  Echocardiogram 01/23/2018: Study Conclusions  - Left ventricle: The cavity size was normal. Wall thickness was increased in a pattern of mild LVH. Systolic function was normal. The estimated ejection fraction was in the range of 55% to 60%. Wall motion was normal; there were no regional wall motion abnormalities. - Aortic valve: Mildly to moderately calcified  annulus. Trileaflet; mildly calcified leaflets. - Mitral valve: There was trivial regurgitation. - Right atrium: Central venous pressure (est): 3 mm Hg. - Atrial septum: No defect or patent foramen ovale was identified. - Tricuspid valve: There was trivial regurgitation. - Pulmonary arteries: Systolic pressure could not be accurately estimated. - Pericardium, extracardiac: A prominent pericardial fat pad was present.  Labs/Other Tests and Data Reviewed:    EKG:  An ECG dated 03/17/2019 was personally reviewed today and demonstrated:  Sinus rhythm with low voltage, decreased R wave progression, nonspecific T wave changes.  Recent Labs:  August 2019: BUN 16, creatinine 0.83, potassium 3.8, AST 20, ALT 21, cholesterol 112, triglycerides 124, HDL 42, LDL 45, hemoglobin A1c 5.7%, hemoglobin 11.8, platelets 314 August 2020: BUN 17, creatinine 0.85, potassium 4.3, AST 32, ALT 40, hemoglobin A1c 7.3%  Wt Readings from Last 3 Encounters:  03/29/20 190 lb (86.2 kg)  09/30/19 197 lb (89.4 kg)  04/15/19 201 lb (91.2 kg)     Objective:    Vital Signs:  BP (!) 145/92   Ht 5\' 4"  (1.626 m)   Wt 190 lb (86.2 kg)   BMI 32.61 kg/m    Patient spoke in full sentences, not short of breath on the phone.  ASSESSMENT & PLAN:    1.  Multivessel CAD status post CABG in June 2019.  She reports no active angina on medical therapy.  Continue aspirin, Lipitor, Lopressor, and Cozaar.  2.  Mixed hyperlipidemia, she remains on Lipitor with follow-up by Dr. July 2019.  Last LDL was 45.   Time:   Today, I have spent 5 minutes with the patient with telehealth technology discussing the above problems.     Medication Adjustments/Labs and Tests Ordered: Current medicines are reviewed at length with the patient today.  Concerns regarding medicines are outlined above.   Tests Ordered: No orders of the defined types were placed in this encounter.   Medication Changes: No orders of the defined types  were placed in this encounter.   Follow Up:  In Person 6 months in the [place] office.  Signed, [place], MD  03/29/2020 2:41 PM    [facility]

## 2020-03-29 NOTE — Patient Instructions (Signed)
Medication Instructions:  Continue all current medications.   Labwork: none  Testing/Procedures: none  Follow-Up: 6 months   Any Other Special Instructions Will Be Listed Below (If Applicable).   If you need a refill on your cardiac medications before your next appointment, please call your pharmacy.  

## 2020-03-31 ENCOUNTER — Ambulatory Visit: Admit: 2020-03-31 | Discharge: 2020-04-01 | Payer: PRIVATE HEALTH INSURANCE

## 2020-03-31 DIAGNOSIS — N209 Urinary calculus, unspecified: Principal | ICD-10-CM

## 2021-03-28 NOTE — Progress Notes (Signed)
Cardiology Office Note  Date: 03/29/2021   ID: Leslie Cobb, DOB 10/12/1955, MRN [id number]  PCP:  Juliette Alcide, MD  Cardiologist:  Nona Dell, MD Electrophysiologist:  None   Chief Complaint  Patient presents with   Cardiac follow-up    History of Present Illness: Leslie Cobb is a 65 y.o. female last assessed via telehealth encounter in July 2021.  She is here for a routine visit.  Reports no angina symptoms or nitroglycerin use.  She has been walking her 20-month-old grandson in a stroller regularly.  NYHA class II dyspnea, no palpitations or syncope.  I reviewed her medications which are noted below.  We are requesting her last lipid panel from Dr. Leandrew Koyanagi.  She continues to tolerate high-dose Lipitor.  I reviewed her diabetic regimen, she states that she tried [place] previously but had recurrent yeast infections and had to stop it.  I personally reviewed her ECG today which shows sinus bradycardia.  Past Medical History:  Diagnosis Date   CAD (coronary artery disease)    a. 02/2018: cath due to unstable angina --> showed multivessel CAD with CABG recommended. b. 02/20/2018: s/p CABG with LIMA-LAD, Seq SVG-Ramus 1 and 2, and SVG-PLB    Hyperlipidemia    Hypertension    Low back pain 07/2013   Obesity    Type 2 diabetes mellitus (HCC)    Vaginal Pap smear, abnormal     Past Surgical History:  Procedure Laterality Date   BREAST SURGERY Left    CATARACT EXTRACTION     CORONARY ARTERY BYPASS GRAFT N/A 02/20/2018   Procedure: CORONARY ARTERY BYPASS GRAFTING (CABG) x 4 (LIMA to LAD, SVG SEQUENTIALLY to RAMUS 1 and 2, SVG to PLB) with EVH from RIGHT GREATER SAPHENOUS VEIN and LEFT INTERNAL MAMMARY ARTERY HARVEST;  Surgeon: Loreli Slot, MD;  Location: [facility] OR;  Service: Open Heart Surgery;  Laterality: N/A;   GALLBLADDER SURGERY     LEFT HEART CATH AND CORONARY ANGIOGRAPHY N/A 02/18/2018   Procedure: LEFT HEART CATH AND CORONARY  ANGIOGRAPHY;  Surgeon: Marykay Lex, MD;  Location: [facility] INVASIVE CV LAB;  Service: Cardiovascular;  Laterality: N/A;   TEE WITHOUT CARDIOVERSION N/A 02/20/2018   Procedure: TRANSESOPHAGEAL ECHOCARDIOGRAM (TEE);  Surgeon: Loreli Slot, MD;  Location: [facility] OR;  Service: Open Heart Surgery;  Laterality: N/A;    Current Outpatient Medications  Medication Sig Dispense Refill   acetaminophen (TYLENOL) 500 MG tablet Take 2 tablets (1,000 mg total) by mouth every 8 (eight) hours as needed for mild pain. 30 tablet 0   aspirin 81 MG tablet Take 1 tablet (81 mg total) by mouth daily.     atorvastatin (LIPITOR) 80 MG tablet Take 1 tablet (80 mg total) by mouth daily at 6 PM. 30 tablet 1   Cholecalciferol (VITAMIN D-3) 1000 units CAPS Take 1,000 Units by mouth daily.      Cyanocobalamin (VITAMIN B 12 PO) Take 1,000 mcg by mouth 2 (two) times a week. Saturday and Sunday     fluticasone (FLONASE) 50 MCG/ACT nasal spray as needed.      GLIPIZIDE XL 10 MG 24 hr tablet Take 10 mg by mouth in the morning and at bedtime.   3   hydrochlorothiazide (HYDRODIURIL) 25 MG tablet Take 25 mg by mouth daily.     losartan (COZAAR) 50 MG tablet Take 50 mg by mouth at bedtime.     Melatonin 10 MG TABS Take 1 tablet by mouth at bedtime as  needed.     metFORMIN (GLUCOPHAGE-XR) 500 MG 24 hr tablet Take 1,000 mg by mouth 2 (two) times daily.      metoprolol tartrate (LOPRESSOR) 25 MG tablet Take 1 tablet (25 mg total) by mouth 2 (two) times daily. 60 tablet 1   Multiple Vitamin (MULTIVITAMIN) tablet Take 1 tablet by mouth daily.     Omega-3 Fatty Acids (OMEGA 3 PO) Take 520 mg by mouth every other day.      silver sulfADIAZINE (SILVADENE) 1 % cream Apply 1 application topically as needed.     sodium chloride (OCEAN) 0.65 % SOLN nasal spray Place 1 spray into both nostrils daily as needed for congestion.     vitamin C (ASCORBIC ACID) 250 MG tablet Take 250 mg by mouth daily.     HYDROcodone-acetaminophen  (NORCO/VICODIN) 5-325 MG tablet Take 1 tablet by mouth as needed. (Patient not taking: Reported on 03/29/2021)     No current facility-administered medications for this visit.   Allergies:  Patient has no known allergies.   ROS: No orthopnea or PND, no syncope.  Physical Exam: VS:  BP 136/80   Pulse (!) 58   Ht 5\' 4"  (1.626 m)   Wt 190 lb 9.6 oz (86.5 kg)   SpO2 99%   BMI 32.72 kg/m , BMI Body mass index is 32.72 kg/m.  Wt Readings from Last 3 Encounters:  03/29/21 190 lb 9.6 oz (86.5 kg)  03/29/20 190 lb (86.2 kg)  09/30/19 197 lb (89.4 kg)    General: Patient appears comfortable at rest. HEENT: Conjunctiva and lids normal, wearing a mask. Neck: Supple, no elevated JVP or carotid bruits, no thyromegaly. Lungs: Clear to auscultation, nonlabored breathing at rest. Cardiac: Regular rate and rhythm, no S3, 2/6 systolic murmur, no pericardial rub. Extremities: No pitting edema.  ECG:  An ECG dated 03/29/2020 was personally reviewed today and demonstrated:  Sinus rhythm with low voltage, decreased R wave progression, nonspecific T wave changes.  Recent Labwork:    Component Value Date/Time   CHOL 132 02/20/2018 0447   TRIG 143 02/20/2018 0447   HDL 46 02/20/2018 0447   CHOLHDL 2.9 02/20/2018 0447   VLDL 29 02/20/2018 0447   LDLCALC 57 02/20/2018 0447  August 2020: BUN 17, creatinine 0.85, potassium 4.3, AST 32, ALT 40, hemoglobin A1c 7.3%  Other Studies Reviewed Today:  Cardiac catheterization 02/18/2018: Ost LAD to Prox LAD lesion is 95% stenosed. Prox LAD lesion is 80% stenosed. Ost Ramus to Ramus lesion is 90% stenosed. Ramus lesion is 45% stenosed. Lat Ramus lesion is 100% stenosed. Prox Cx lesion is 85% stenosed. Mid Cx lesion is 70% stenosed. 1st RPLB lesion is 95% stenosed. The left ventricular systolic function is normal. The left ventricular ejection fraction is 55-65% by visual estimate. LV end diastolic pressure is mildly elevated.   Severe Multivessel CAD with  Class III Angina Normal LVEF with mildly elevated LVEDP   Echocardiogram 01/23/2018: Study Conclusions   - Left ventricle: The cavity size was normal. Wall thickness was   increased in a pattern of mild LVH. Systolic function was normal.   The estimated ejection fraction was in the range of 55% to 60%.   Wall motion was normal; there were no regional wall motion   abnormalities. - Aortic valve: Mildly to moderately calcified annulus. Trileaflet;   mildly calcified leaflets. - Mitral valve: There was trivial regurgitation. - Right atrium: Central venous pressure (est): 3 mm Hg. - Atrial septum: No defect or patent foramen ovale  was identified. - Tricuspid valve: There was trivial regurgitation. - Pulmonary arteries: Systolic pressure could not be accurately   estimated. - Pericardium, extracardiac: A prominent pericardial fat pad was   present.  Assessment and Plan:  1.  Multivessel CAD status post CABG in 2019.  She remains stable without active angina on medical therapy, ECG reviewed.  At this point we will continue observation.  Currently on aspirin, Lipitor, Cozaar, and Lopressor.  2.  Mixed hyperlipidemia, tolerating high-dose Lipitor.  Requesting most recent lipid panel from Dr. Leandrew Koyanagi.  3.  Aortic valve sclerosis without stenosis.  4.  Type 2 diabetes mellitus, followed by Dr. Leandrew Koyanagi on Glucophage and glipizide.  Did not tolerate Jard[place] previously due to recurrent yeast infections.  Medication Adjustments/Labs and Tests Ordered: Current medicines are reviewed at length with the patient today.  Concerns regarding medicines are outlined above.   Tests Ordered: Orders Placed This Encounter  Procedures   EKG 12-Lead    Medication Changes: No orders of the defined types were placed in this encounter.   Disposition:  Follow up  1 year.  Signed, Jonelle Sidle, MD, [facility] 03/29/2021 8:45 AM    [facility] at [facility] [address]  [place], [place] [address] Phone: [phone]; Fax: [phone]

## 2021-03-29 ENCOUNTER — Ambulatory Visit: Payer: BC Managed Care – PPO | Admitting: Cardiology

## 2021-03-29 ENCOUNTER — Other Ambulatory Visit: Payer: Self-pay

## 2021-03-29 ENCOUNTER — Encounter: Payer: Self-pay | Admitting: Cardiology

## 2021-03-29 VITALS — BP 136/80 | HR 58 | Ht 64.0 in | Wt 190.6 lb

## 2021-03-29 DIAGNOSIS — I25119 Atherosclerotic heart disease of native coronary artery with unspecified angina pectoris: Secondary | ICD-10-CM

## 2021-03-29 DIAGNOSIS — I10 Essential (primary) hypertension: Secondary | ICD-10-CM | POA: Diagnosis not present

## 2021-03-29 DIAGNOSIS — E782 Mixed hyperlipidemia: Secondary | ICD-10-CM | POA: Diagnosis not present

## 2021-03-29 NOTE — Patient Instructions (Signed)
Medication Instructions:  Your physician recommends that you continue on your current medications as directed. Please refer to the Current Medication list given to you today.  *If you need a refill on your cardiac medications before your next appointment, please call your pharmacy*   Lab Work: We have requested your most recent LIPID PANEL from your primary care provider If you have labs (blood work) drawn today and your tests are completely normal, you will receive your results only by: MyChart Message (if you have MyChart) OR A paper copy in the mail If you have any lab test that is abnormal or we need to change your treatment, we will call you to review the results.   Testing/Procedures: None   Follow-Up: At [facility], you and your health needs are our priority.  As part of our continuing mission to provide you with exceptional heart care, we have created designated Provider Care Teams.  These Care Teams include your primary Cardiologist (physician) and Advanced Practice Providers (APPs -  Physician Assistants and Nurse Practitioners) who all work together to provide you with the care you need, when you need it.  We recommend signing up for the patient portal called "MyChart".  Sign up information is provided on this After Visit Summary.  MyChart is used to connect with patients for Virtual Visits (Telemedicine).  Patients are able to view lab/test results, encounter notes, upcoming appointments, etc.  Non-urgent messages can be sent to your provider as well.   To learn more about what you can do with MyChart, go to [email or web address].    Your next appointment:   1 year(s)  The format for your next appointment:   In Person  Provider:   Nona Dell, MD   Other Instructions

## 2021-07-22 DIAGNOSIS — Z1231 Encounter for screening mammogram for malignant neoplasm of breast: Principal | ICD-10-CM

## 2021-10-24 ENCOUNTER — Ambulatory Visit: Admit: 2021-10-24 | Discharge: 2021-10-24 | Payer: PRIVATE HEALTH INSURANCE

## 2022-01-25 ENCOUNTER — Encounter: Admit: 2022-01-25 | Discharge: 2022-01-25 | Payer: PRIVATE HEALTH INSURANCE

## 2022-01-25 DIAGNOSIS — Z87442 Personal history of urinary calculi: Principal | ICD-10-CM

## 2022-03-30 ENCOUNTER — Encounter: Payer: Self-pay | Admitting: Cardiology

## 2022-03-30 ENCOUNTER — Encounter: Payer: Self-pay | Admitting: *Deleted

## 2022-03-30 ENCOUNTER — Ambulatory Visit: Payer: BC Managed Care – PPO | Admitting: Cardiology

## 2022-03-30 VITALS — BP 130/78 | HR 78 | Ht 64.0 in | Wt 187.0 lb

## 2022-03-30 DIAGNOSIS — I25119 Atherosclerotic heart disease of native coronary artery with unspecified angina pectoris: Secondary | ICD-10-CM | POA: Diagnosis not present

## 2022-03-30 DIAGNOSIS — E782 Mixed hyperlipidemia: Secondary | ICD-10-CM

## 2022-03-30 DIAGNOSIS — I358 Other nonrheumatic aortic valve disorders: Secondary | ICD-10-CM | POA: Diagnosis not present

## 2022-03-30 NOTE — Patient Instructions (Addendum)

## 2022-03-30 NOTE — Progress Notes (Signed)
Cardiology Office Note  Date: 03/30/2022   ID: Leslie Cobb, DOB 14-Oct-1955, MRN [id number]  PCP:  Juliette Alcide, MD  Cardiologist:  Nona Dell, MD Electrophysiologist:  None   Chief Complaint  Patient presents with   Cardiac follow-up    History of Present Illness: Leslie Cobb is a 66 y.o. female last seen in July 2022.  She is here for a routine visit.  She does not describe any angina symptoms over the last year and stable NYHA class II dyspnea.  Still enjoys taking care of her grandson on a regular basis.  I went over her medications which are stable from a cardiac perspective.  She had lab work with Dr. Leandrew Koyanagi back in June, requesting results for review.  Over time her cholesterol has been well controlled, previously 47 in April of last year.  I personally reviewed her ECG today which shows sinus rhythm with PVCs, decreased R wave progression.  Past Medical History:  Diagnosis Date   CAD (coronary artery disease)    a. 02/2018: cath due to unstable angina --> showed multivessel CAD with CABG recommended. b. 02/20/2018: s/p CABG with LIMA-LAD, Seq SVG-Ramus 1 and 2, and SVG-PLB    Hyperlipidemia    Hypertension    Low back pain 07/2013   Obesity    Type 2 diabetes mellitus (HCC)    Vaginal Pap smear, abnormal     Past Surgical History:  Procedure Laterality Date   BREAST SURGERY Left    CATARACT EXTRACTION     CORONARY ARTERY BYPASS GRAFT N/A 02/20/2018   Procedure: CORONARY ARTERY BYPASS GRAFTING (CABG) x 4 (LIMA to LAD, SVG SEQUENTIALLY to RAMUS 1 and 2, SVG to PLB) with EVH from RIGHT GREATER SAPHENOUS VEIN and LEFT INTERNAL MAMMARY ARTERY HARVEST;  Surgeon: Loreli Slot, MD;  Location: [facility] OR;  Service: Open Heart Surgery;  Laterality: N/A;   GALLBLADDER SURGERY     LEFT HEART CATH AND CORONARY ANGIOGRAPHY N/A 02/18/2018   Procedure: LEFT HEART CATH AND CORONARY ANGIOGRAPHY;  Surgeon: Marykay Lex, MD;  Location[facility]  INVASIVE CV LAB;  Service: Cardiovascular;  Laterality: N/A;   TEE WITHOUT CARDIOVERSION N/A 02/20/2018   Procedure: TRANSESOPHAGEAL ECHOCARDIOGRAM (TEE);  Surgeon: Loreli Slot, MD;  Location: [facility] OR;  Service: Open Heart Surgery;  Laterality: N/A;    Current Outpatient Medications  Medication Sig Dispense Refill   acetaminophen (TYLENOL) 500 MG tablet Take 2 tablets (1,000 mg total) by mouth every 8 (eight) hours as needed for mild pain. 30 tablet 0   aspirin 81 MG tablet Take 1 tablet (81 mg total) by mouth daily.     atorvastatin (LIPITOR) 80 MG tablet Take 1 tablet (80 mg total) by mouth daily at 6 PM. 30 tablet 1   Cholecalciferol (VITAMIN D-3) 1000 units CAPS Take 1,000 Units by mouth daily.      Cyanocobalamin (VITAMIN B 12 PO) Take 1,000 mcg by mouth 2 (two) times a week. Saturday and Sunday     fluticasone (FLONASE) 50 MCG/ACT nasal spray as needed.      GLIPIZIDE XL 10 MG 24 hr tablet Take 10 mg by mouth in the morning and at bedtime.   3   hydrochlorothiazide (HYDRODIURIL) 25 MG tablet Take 25 mg by mouth daily.     losartan (COZAAR) 50 MG tablet Take 50 mg by mouth at bedtime.     Melatonin 10 MG TABS Take 1 tablet by mouth at bedtime as needed.  metFORMIN (GLUCOPHAGE-XR) 500 MG 24 hr tablet Take 1,000 mg by mouth 2 (two) times daily.      metoprolol tartrate (LOPRESSOR) 25 MG tablet Take 1 tablet (25 mg total) by mouth 2 (two) times daily. 60 tablet 1   Multiple Vitamin (MULTIVITAMIN) tablet Take 1 tablet by mouth daily.     Omega-3 Fatty Acids (OMEGA 3 PO) Take 520 mg by mouth every other day.      sodium chloride (OCEAN) 0.65 % SOLN nasal spray Place 1 spray into both nostrils daily as needed for congestion.     HYDROcodone-acetaminophen (NORCO/VICODIN) 5-325 MG tablet Take 1 tablet by mouth as needed. (Patient not taking: Reported on 03/29/2021)     silver sulfADIAZINE (SILVADENE) 1 % cream Apply 1 application topically as needed. (Patient not taking: Reported on  03/30/2022)     vitamin C (ASCORBIC ACID) 250 MG tablet Take 250 mg by mouth daily. (Patient not taking: Reported on 03/30/2022)     No current facility-administered medications for this visit.   Allergies:  Patient has no known allergies.   ROS: No palpitations or syncope.  Physical Exam: VS:  BP 130/78   Pulse 78   Ht 5\' 4"  (1.626 m)   Wt 187 lb (84.8 kg)   SpO2 96%   BMI 32.10 kg/m , BMI Body mass index is 32.1 kg/m.  Wt Readings from Last 3 Encounters:  03/30/22 187 lb (84.8 kg)  03/29/21 190 lb 9.6 oz (86.5 kg)  03/29/20 190 lb (86.2 kg)    General: Patient appears comfortable at rest. HEENT: Conjunctiva and lids normal, oropharynx clear. Neck: Supple, no elevated JVP or carotid bruits, no thyromegaly. Lungs: Clear to auscultation, nonlabored breathing at rest. Cardiac: Regular rate and rhythm, no S3, 2/6 systolic murmur. Abdomen: Soft, bowel sounds present. Extremities: No pitting edema.  ECG:  An ECG dated 03/29/2021 was personally reviewed today and demonstrated:  Sinus bradycardia.  Recent Labwork:  April 2022: Cholesterol 115, triglycerides 136, HDL 44, LDL 47 July 2022: BUN 21, creatinine 0.94, potassium 4.3, AST 27, ALT 34, hemoglobin A1c 7.3%  Other Studies Reviewed Today:  Cardiac catheterization 02/18/2018: Ost LAD to Prox LAD lesion is 95% stenosed. Prox LAD lesion is 80% stenosed. Ost Ramus to Ramus lesion is 90% stenosed. Ramus lesion is 45% stenosed. Lat Ramus lesion is 100% stenosed. Prox Cx lesion is 85% stenosed. Mid Cx lesion is 70% stenosed. 1st RPLB lesion is 95% stenosed. The left ventricular systolic function is normal. The left ventricular ejection fraction is 55-65% by visual estimate. LV end diastolic pressure is mildly elevated.   Severe Multivessel CAD with Class III Angina Normal LVEF with mildly elevated LVEDP   Echocardiogram 01/23/2018: Study Conclusions   - Left ventricle: The cavity size was normal. Wall thickness was    increased in a pattern of mild LVH. Systolic function was normal.   The estimated ejection fraction was in the range of 55% to 60%.   Wall motion was normal; there were no regional wall motion   abnormalities. - Aortic valve: Mildly to moderately calcified annulus. Trileaflet;   mildly calcified leaflets. - Mitral valve: There was trivial regurgitation. - Right atrium: Central venous pressure (est): 3 mm Hg. - Atrial septum: No defect or patent foramen ovale was identified. - Tricuspid valve: There was trivial regurgitation. - Pulmonary arteries: Systolic pressure could not be accurately   estimated. - Pericardium, extracardiac: A prominent pericardial fat pad was   present.  Assessment and Plan:  1.  Multivessel CAD status post CABG in 2019.  No angina or changes in stamina since last encounter.  ECG reviewed.  Continue observation on aspirin, Lipitor, Cozaar, and Lopressor.  She is not on SGLT2 inhibitors with prior history of recurrent yeast infections.  2.  Mixed hyperlipidemia on Lipitor.  Requesting interval lab work from Dr. Leandrew Koyanagi.  3.  Sclerotic aortic valve without stenosis based on prior work-up.  We will consider a follow-up echocardiogram around the time of her next visit.  Medication Adjustments/Labs and Tests Ordered: Current medicines are reviewed at length with the patient today.  Concerns regarding medicines are outlined above.   Tests Ordered: Orders Placed This Encounter  Procedures   EKG 12-Lead    Medication Changes: No orders of the defined types were placed in this encounter.   Disposition:  Follow up  1 year.  Signed, Jonelle Sidle, MD, [facility] 03/30/2022 3:58 PM    [facility] at [facility] [address] [place], [place], [place] [address] Phone: [phone]; Fax: [phone]

## 2022-06-14 ENCOUNTER — Encounter: Payer: Self-pay | Admitting: *Deleted

## 2022-06-14 ENCOUNTER — Telehealth: Payer: Self-pay | Admitting: Cardiology

## 2022-06-14 ENCOUNTER — Other Ambulatory Visit (HOSPITAL_COMMUNITY)
Admission: RE | Admit: 2022-06-14 | Discharge: 2022-06-14 | Disposition: A | Discharge status: Home / Self Care | Payer: BC Managed Care – PPO | Source: Ambulatory Visit | Attending: Family Medicine | Admitting: Family Medicine

## 2022-06-14 DIAGNOSIS — I25119 Atherosclerotic heart disease of native coronary artery with unspecified angina pectoris: Secondary | ICD-10-CM | POA: Diagnosis present

## 2022-06-14 DIAGNOSIS — Z79899 Other long term (current) drug therapy: Secondary | ICD-10-CM | POA: Insufficient documentation

## 2022-06-14 LAB — TROPONIN I (HIGH SENSITIVITY): Troponin I (High Sensitivity): 5 ng/L (ref <18)

## 2022-06-14 NOTE — Telephone Encounter (Signed)
Domenic Polite spoke with Burdine regarding patient being in office with chest pain, EKG done and unchanged. Burdine is sending patient to [facility] for troponin. If labs abnormal, will send to [facility] ED for evaluation, otherwise, appointment scheduled with Domenic Polite 06/26/2022 @1 :00 pm. Patient aware and verbalized understanding.

## 2022-06-14 NOTE — Telephone Encounter (Signed)
Office calling to speak to Dr. Domenic Polite in regards to pt tat is currently in their office. Call transferred to nurse

## 2022-06-26 ENCOUNTER — Ambulatory Visit: Payer: BC Managed Care – PPO | Admitting: Cardiology

## 2022-07-04 ENCOUNTER — Telehealth: Payer: Self-pay | Admitting: Cardiology

## 2022-07-04 ENCOUNTER — Ambulatory Visit: Payer: BC Managed Care – PPO | Attending: Cardiology | Admitting: Cardiology

## 2022-07-04 ENCOUNTER — Encounter: Payer: Self-pay | Admitting: Cardiology

## 2022-07-04 VITALS — BP 172/88 | HR 63 | Ht 64.0 in | Wt 187.2 lb

## 2022-07-04 DIAGNOSIS — I200 Unstable angina: Secondary | ICD-10-CM | POA: Diagnosis not present

## 2022-07-04 DIAGNOSIS — I25119 Atherosclerotic heart disease of native coronary artery with unspecified angina pectoris: Secondary | ICD-10-CM

## 2022-07-04 DIAGNOSIS — I358 Other nonrheumatic aortic valve disorders: Secondary | ICD-10-CM

## 2022-07-04 DIAGNOSIS — E782 Mixed hyperlipidemia: Secondary | ICD-10-CM

## 2022-07-04 MED ORDER — ISOSORBIDE MONONITRATE ER 30 MG PO TB24: 30.0000 mg | ORAL_TABLET | Freq: Every evening | ORAL | 1 refills | Status: DC

## 2022-07-04 NOTE — H&P (View-Only) (Signed)
  Cardiology Office Note  Date: 07/04/2022   ID: Leslie Cobb, DOB 03/13/1956, MRN [id number]  PCP:  Burdine, Steven E, MD  Cardiologist:  Kamali Nephew, MD Electrophysiologist:  None   Chief Complaint  Patient presents with   Cardiac follow-up    History of Present Illness: Leslie Cobb is a 66 y.o. female last seen in July.  She was seen recently by Dr. Burdine with complaint of chest pain.  ECG showed no acute ST segment changes as described below and high-sensitivity troponin I level was normal.  She is here today with her daughter for a follow-up visit.  She states that within the last few weeks she has been experiencing recurring chest discomfort, thought that it may have been indigestion, but also a sense of unease with activities such as walking out to her trash can.  She has some belching with her symptoms when she eats, but not with exertion.  Blood pressure has also been elevated during this time on stable medical regimen.  She is status post CABG in 2019 as noted below.  We went over her medications today.  Also discussed options for follow-up cardiovascular evaluation.  Past Medical History:  Diagnosis Date   CAD (coronary artery disease)    a. 02/2018: cath due to unstable angina --> showed multivessel CAD with CABG recommended. b. 02/20/2018: s/p CABG with LIMA-LAD, Seq SVG-Ramus 1 and 2, and SVG-PLB    Hyperlipidemia    Hypertension    Low back pain 07/2013   Obesity    Type 2 diabetes mellitus (HCC)    Vaginal Pap smear, abnormal     Past Surgical History:  Procedure Laterality Date   BREAST SURGERY Left    CATARACT EXTRACTION     CORONARY ARTERY BYPASS GRAFT N/A 02/20/2018   Procedure: CORONARY ARTERY BYPASS GRAFTING (CABG) x 4 (LIMA to LAD, SVG SEQUENTIALLY to RAMUS 1 and 2, SVG to PLB) with EVH from RIGHT GREATER SAPHENOUS VEIN and LEFT INTERNAL MAMMARY ARTERY HARVEST;  Surgeon: Hendrickson, Steven C, MD;  Location: [facility] OR;  Service:  Open Heart Surgery;  Laterality: N/A;   GALLBLADDER SURGERY     LEFT HEART CATH AND CORONARY ANGIOGRAPHY N/A 02/18/2018   Procedure: LEFT HEART CATH AND CORONARY ANGIOGRAPHY;  Surgeon: Harding, David W, MD;  Location: [facility] INVASIVE CV LAB;  Service: Cardiovascular;  Laterality: N/A;   TEE WITHOUT CARDIOVERSION N/A 02/20/2018   Procedure: TRANSESOPHAGEAL ECHOCARDIOGRAM (TEE);  Surgeon: Hendrickson, Steven C, MD;  Location: [facility] OR;  Service: Open Heart Surgery;  Laterality: N/A;    Current Outpatient Medications  Medication Sig Dispense Refill   acetaminophen (TYLENOL) 500 MG tablet Take 2 tablets (1,000 mg total) by mouth every 8 (eight) hours as needed for mild pain. 30 tablet 0   aspirin 81 MG tablet Take 1 tablet (81 mg total) by mouth daily.     atorvastatin (LIPITOR) 80 MG tablet Take 1 tablet (80 mg total) by mouth daily at 6 PM. 30 tablet 1   Cholecalciferol (VITAMIN D-3) 1000 units CAPS Take 1,000 Units by mouth daily.      Cyanocobalamin (VITAMIN B 12 PO) Take 1,000 mcg by mouth 2 (two) times a week. Saturday and Sunday     fluticasone (FLONASE) 50 MCG/ACT nasal spray as needed.      GLIPIZIDE XL 10 MG 24 hr tablet Take 10 mg by mouth in the morning and at bedtime.   3   hydrochlorothiazide (HYDRODIURIL) 25 MG tablet Take 25 mg   by mouth daily.     isosorbide mononitrate (IMDUR) 30 MG 24 hr tablet Take 1 tablet (30 mg total) by mouth every evening. 90 tablet 1   losartan (COZAAR) 50 MG tablet Take 50 mg by mouth at bedtime.     Melatonin 10 MG TABS Take 1 tablet by mouth at bedtime as needed.     metFORMIN (GLUCOPHAGE-XR) 500 MG 24 hr tablet Take 1,000 mg by mouth 2 (two) times daily.      metoprolol tartrate (LOPRESSOR) 25 MG tablet Take 1 tablet (25 mg total) by mouth 2 (two) times daily. 60 tablet 1   Multiple Vitamin (MULTIVITAMIN) tablet Take 1 tablet by mouth daily.     Omega-3 Fatty Acids (OMEGA 3 PO) Take 520 mg by mouth every other day.      sodium chloride (OCEAN) 0.65 % SOLN  nasal spray Place 1 spray into both nostrils daily as needed for congestion.     vitamin C (ASCORBIC ACID) 250 MG tablet Take 250 mg by mouth daily.     No current facility-administered medications for this visit.   Allergies:  Patient has no known allergies.   Social History: The patient  reports that she has never smoked. She has never used smokeless tobacco. She reports current alcohol use. She reports that she does not use drugs.   Family History: [name] history includes Arthritis in her mother; Diabetes in her maternal aunt, maternal grandmother, and mother; Heart disease in her brother, brother, brother, father, and mother; Hypertension in her brother and mother; Kidney disease in her mother; Lung cancer in her father.   ROS: No palpitations or syncope.  Physical Exam: VS:  BP (!) 172/88   Pulse 63   Ht 5\' 4"  (1.626 m)   Wt 187 lb 3.2 oz (84.9 kg)   SpO2 97%   BMI 32.13 kg/m , BMI Body mass index is 32.13 kg/m.  Wt Readings from Last 3 Encounters:  07/04/22 187 lb 3.2 oz (84.9 kg)  03/30/22 187 lb (84.8 kg)  03/29/21 190 lb 9.6 oz (86.5 kg)    General: Patient appears comfortable at rest. HEENT: Conjunctiva and lids normal. Neck: Supple, no elevated JVP or carotid bruits. Lungs: Clear to auscultation, nonlabored breathing at rest. Cardiac: Regular rate and rhythm, no S3, 2/6 systolic murmur, no pericardial rub. Abdomen: Soft, nontender, bowel sounds present. Extremities: No pitting edema, distal pulses 2+. Skin: Warm and dry. Musculoskeletal: No kyphosis. Neuropsychiatric: Alert and oriented x3, affect grossly appropriate.  ECG:  An ECG dated 06/14/2022 was personally reviewed today and demonstrated:  SInus rhythm with leftward axis and nonspecific T wave changes.  Recent Labwork: May 2023: BUN 18, creatinine 0.86, potassium 3.8, AST 24, ALT 32, cholesterol 115, triglycerides 160, HDL 44, LDL 44, hemoglobin A1c 7.8%  Other Studies Reviewed  Today:  Echocardiogram 01/23/2018: Study Conclusions   - Left ventricle: The cavity size was normal. Wall thickness was   increased in a pattern of mild LVH. Systolic function was normal.   The estimated ejection fraction was in the range of 55% to 60%.   Wall motion was normal; there were no regional wall motion   abnormalities. - Aortic valve: Mildly to moderately calcified annulus. Trileaflet;   mildly calcified leaflets. - Mitral valve: There was trivial regurgitation. - Right atrium: Central venous pressure (est): 3 mm Hg. - Atrial septum: No defect or patent foramen ovale was identified. - Tricuspid valve: There was trivial regurgitation. - Pulmonary arteries: Systolic pressure could  not be accurately   estimated. - Pericardium, extracardiac: A prominent pericardial fat pad was   present.  Assessment and Plan:  1.  Symptoms concerning for accelerating angina in a 66-year-old woman status post CABG in 2019.  She reports compliance with baseline medical therapy, blood pressure has been up during the course of symptoms of the last few weeks as well.  High-sensitivity troponin I level was negative at visit with PCP, ECG shows no acute ST segment changes.  We have discussed options for evaluation and plan will be to pursue a follow-up diagnostic cardiac catheterization to reevaluate graft patency and potential for any revascularization options in light of recent onset symptoms.  Risks and benefits discussed and she is in agreement to proceed.  Add Imdur 30 mg daily for now as well.  2.  Multivessel CAD status post CABG in 2019 including LIMA to LAD, SVG to ramus, and SVG to PLB.  Continue aspirin, Lipitor, Cozaar, and Lopressor.  Adding Imdur 30 mg daily.  She is not on SGLT2 inhibitor in light of recurring yeast infections.  3.  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  4.  Mixed hyperlipidemia, on Lipitor with last LDL 44.  Medication Adjustments/Labs and Tests  Ordered: Current medicines are reviewed at length with the patient today.  Concerns regarding medicines are outlined above.   Tests Ordered: No orders of the defined types were placed in this encounter.   Medication Changes: Meds ordered this encounter  Medications   isosorbide mononitrate (IMDUR) 30 MG 24 hr tablet    Sig: Take 1 tablet (30 mg total) by mouth every evening.    Dispense:  90 tablet    Refill:  1    07/04/22 New Start    Disposition:  Follow up  after testing.  Signed, Naoko Diperna G. Tailyn Hantz, MD, FACC 07/04/2022 11:30 AM    [facility] [place] [address], [place], [place] [address] Phone: [phone]; Fax: [phone]  

## 2022-07-04 NOTE — Progress Notes (Signed)
Cardiology Office Note  Date: 07/04/2022   ID: Leslie Cobb, DOB 1956/07/12, MRN [id number]  PCP:  Curlene Labrum, MD  Cardiologist:  Rozann Lesches, MD Electrophysiologist:  None   Chief Complaint  Patient presents with   Cardiac follow-up    History of Present Illness: Leslie Cobb is a 66 y.o. female last seen in July.  She was seen recently by Dr. Pleas Koch with complaint of chest pain.  ECG showed no acute ST segment changes as described below and high-sensitivity troponin I level was normal.  She is here today with her daughter for a follow-up visit.  She states that within the last few weeks she has been experiencing recurring chest discomfort, thought that it may have been indigestion, but also a sense of unease with activities such as walking out to her trash can.  She has some belching with her symptoms when she eats, but not with exertion.  Blood pressure has also been elevated during this time on stable medical regimen.  She is status post CABG in 2019 as noted below.  We went over her medications today.  Also discussed options for follow-up cardiovascular evaluation.  Past Medical History:  Diagnosis Date   CAD (coronary artery disease)    a. 02/2018: cath due to unstable angina --> showed multivessel CAD with CABG recommended. b. 02/20/2018: s/p CABG with LIMA-LAD, Seq SVG-Ramus 1 and 2, and SVG-PLB    Hyperlipidemia    Hypertension    Low back pain 07/2013   Obesity    Type 2 diabetes mellitus ([place])    Vaginal Pap smear, abnormal     Past Surgical History:  Procedure Laterality Date   BREAST SURGERY Left    CATARACT EXTRACTION     CORONARY ARTERY BYPASS GRAFT N/A 02/20/2018   Procedure: CORONARY ARTERY BYPASS GRAFTING (CABG) x 4 (LIMA to LAD, SVG SEQUENTIALLY to RAMUS 1 and 2, SVG to PLB) with EVH from [place] and LEFT INTERNAL MAMMARY ARTERY HARVEST;  Surgeon: Melrose Nakayama, MD;  Location: [facility];  Service:  Open Heart Surgery;  Laterality: N/A;   GALLBLADDER SURGERY     LEFT HEART CATH AND CORONARY ANGIOGRAPHY N/A 02/18/2018   Procedure: LEFT HEART CATH AND CORONARY ANGIOGRAPHY;  Surgeon: Leonie Man, MD;  Location: [facility];  Service: Cardiovascular;  Laterality: N/A;   TEE WITHOUT CARDIOVERSION N/A 02/20/2018   Procedure: TRANSESOPHAGEAL ECHOCARDIOGRAM (TEE);  Surgeon: Melrose Nakayama, MD;  Location: [facility];  Service: Open Heart Surgery;  Laterality: N/A;    Current Outpatient Medications  Medication Sig Dispense Refill   acetaminophen (TYLENOL) 500 MG tablet Take 2 tablets (1,000 mg total) by mouth every 8 (eight) hours as needed for mild pain. 30 tablet 0   aspirin 81 MG tablet Take 1 tablet (81 mg total) by mouth daily.     atorvastatin (LIPITOR) 80 MG tablet Take 1 tablet (80 mg total) by mouth daily at 6 PM. 30 tablet 1   Cholecalciferol (VITAMIN D-3) 1000 units CAPS Take 1,000 Units by mouth daily.      Cyanocobalamin (VITAMIN B 12 PO) Take 1,000 mcg by mouth 2 (two) times a week. Saturday and Sunday     fluticasone (FLONASE) 50 MCG/ACT nasal spray as needed.      GLIPIZIDE XL 10 MG 24 hr tablet Take 10 mg by mouth in the morning and at bedtime.   3   hydrochlorothiazide (HYDRODIURIL) 25 MG tablet Take 25 mg  by mouth daily.     isosorbide mononitrate (IMDUR) 30 MG 24 hr tablet Take 1 tablet (30 mg total) by mouth every evening. 90 tablet 1   losartan (COZAAR) 50 MG tablet Take 50 mg by mouth at bedtime.     Melatonin 10 MG TABS Take 1 tablet by mouth at bedtime as needed.     metFORMIN (GLUCOPHAGE-XR) 500 MG 24 hr tablet Take 1,000 mg by mouth 2 (two) times daily.      metoprolol tartrate (LOPRESSOR) 25 MG tablet Take 1 tablet (25 mg total) by mouth 2 (two) times daily. 60 tablet 1   Multiple Vitamin (MULTIVITAMIN) tablet Take 1 tablet by mouth daily.     Omega-3 Fatty Acids (OMEGA 3 PO) Take 520 mg by mouth every other day.      sodium chloride (OCEAN) 0.65 % SOLN  nasal spray Place 1 spray into both nostrils daily as needed for congestion.     vitamin C (ASCORBIC ACID) 250 MG tablet Take 250 mg by mouth daily.     No current facility-administered medications for this visit.   Allergies:  Patient has no known allergies.   Social History: The patient  reports that she has never smoked. She has never used smokeless tobacco. She reports current alcohol use. She reports that she does not use drugs.   Family History: [name] history includes Arthritis in her mother; Diabetes in her maternal aunt, maternal grandmother, and mother; Heart disease in her brother, brother, brother, father, and mother; Hypertension in her brother and mother; Kidney disease in her mother; Lung cancer in her father.   ROS: No palpitations or syncope.  Physical Exam: VS:  BP (!) 172/88   Pulse 63   Ht 5\' 4"  (1.626 m)   Wt 187 lb 3.2 oz (84.9 kg)   SpO2 97%   BMI 32.13 kg/m , BMI Body mass index is 32.13 kg/m.  Wt Readings from Last 3 Encounters:  07/04/22 187 lb 3.2 oz (84.9 kg)  03/30/22 187 lb (84.8 kg)  03/29/21 190 lb 9.6 oz (86.5 kg)    General: Patient appears comfortable at rest. HEENT: Conjunctiva and lids normal. Neck: Supple, no elevated JVP or carotid bruits. Lungs: Clear to auscultation, nonlabored breathing at rest. Cardiac: Regular rate and rhythm, no S3, 2/6 systolic murmur, no pericardial rub. Abdomen: Soft, nontender, bowel sounds present. Extremities: No pitting edema, distal pulses 2+. Skin: Warm and dry. Musculoskeletal: No kyphosis. Neuropsychiatric: Alert and oriented x3, affect grossly appropriate.  ECG:  An ECG dated 06/14/2022 was personally reviewed today and demonstrated:  SInus rhythm with leftward axis and nonspecific T wave changes.  Recent Labwork: May 2023: BUN 18, creatinine 0.86, potassium 3.8, AST 24, ALT 32, cholesterol 115, triglycerides 160, HDL 44, LDL 44, hemoglobin A1c 7.8%  Other Studies Reviewed  Today:  Echocardiogram 01/23/2018: Study Conclusions   - Left ventricle: The cavity size was normal. Wall thickness was   increased in a pattern of mild LVH. Systolic function was normal.   The estimated ejection fraction was in the range of 55% to 60%.   Wall motion was normal; there were no regional wall motion   abnormalities. - Aortic valve: Mildly to moderately calcified annulus. Trileaflet;   mildly calcified leaflets. - Mitral valve: There was trivial regurgitation. - Right atrium: Central venous pressure (est): 3 mm Hg. - Atrial septum: No defect or patent foramen ovale was identified. - Tricuspid valve: There was trivial regurgitation. - Pulmonary arteries: Systolic pressure could  not be accurately   estimated. - Pericardium, extracardiac: A prominent pericardial fat pad was   present.  Assessment and Plan:  1.  Symptoms concerning for accelerating angina in a 66 year old woman status post CABG in 2019.  She reports compliance with baseline medical therapy, blood pressure has been up during the course of symptoms of the last few weeks as well.  High-sensitivity troponin I level was negative at visit with PCP, ECG shows no acute ST segment changes.  We have discussed options for evaluation and plan will be to pursue a follow-up diagnostic cardiac catheterization to reevaluate graft patency and potential for any revascularization options in light of recent onset symptoms.  Risks and benefits discussed and she is in agreement to proceed.  Add Imdur 30 mg daily for now as well.  2.  Multivessel CAD status post CABG in 2019 including LIMA to LAD, SVG to ramus, and SVG to PLB.  Continue aspirin, Lipitor, Cozaar, and Lopressor.  Adding Imdur 30 mg daily.  She is not on SGLT2 inhibitor in light of recurring yeast infections.  3.  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  4.  Mixed hyperlipidemia, on Lipitor with last LDL 44.  Medication Adjustments/Labs and Tests  Ordered: Current medicines are reviewed at length with the patient today.  Concerns regarding medicines are outlined above.   Tests Ordered: No orders of the defined types were placed in this encounter.   Medication Changes: Meds ordered this encounter  Medications   isosorbide mononitrate (IMDUR) 30 MG 24 hr tablet    Sig: Take 1 tablet (30 mg total) by mouth every evening.    Dispense:  90 tablet    Refill:  1    07/04/22 New Start    Disposition:  Follow up  after testing.  Signed, Satira Sark, MD, [facility] 07/04/2022 11:30 AM    [place] at [place], [place], [place] [address] Phone: [phone]; Fax: [phone]

## 2022-07-04 NOTE — Patient Instructions (Addendum)
Medication Instructions:  Your physician has recommended you make the following change in your medication:  Start imdur 30 mg once in the evening. Continue all other medications as directed.  Labwork: CBC, BMP (Friday 10/27 @ [facility])  Testing/Procedures:  Follow-Up:  Your physician recommends that you schedule a follow-up appointment in: F/u Pending  Any Other Special Instructions Will Be Listed Below (If Applicable).  If you need a refill on your cardiac medications before your next appointment, please call your pharmacy.   [facility] DE[facility] OF [facility] [id number] [place] [address] Dept: [phone] Loc: [place]  07/04/2022  You are scheduled for a Cardiac Catheterization on Wednesday, November 1 with Dr. Glenetta Hew.  1. Please arrive at the [facility] (Main Entrance A) at [facility]: [address] [place], [place] [address] at 6:30 AM (This time is two hours before your procedure to ensure your preparation). Free valet parking service is available.   Special note: Every effort is made to have your procedure done on time. Please understand that emergencies sometimes delay scheduled procedures.  2. Diet: Do not eat solid foods after midnight.  The patient may have clear liquids until 5am upon the day of the procedure.  3. Labs: You will need to have blood drawn on Friday, October 27 at [facility]. You do not need to be fasting.  4. Medication instructions in preparation for your procedure  Hold Metformin and glipizide the morning of procedure. Hold metformin 48 hours after procedure. Hold hydrochlorothiazide the morning of procedure.  On the morning of your procedure, take your Aspirin 81 mg and any morning medicines NOT listed above.  You may use sips of water.  5. Plan for one night  stay--bring personal belongings. 6. Bring a current list of your medications and current insurance cards. 7. You MUST have a responsible person to drive you home. 8. Someone MUST be with you the first 24 hours after you arrive home or your discharge will be delayed. 9. Please wear clothes that are easy to get on and off and wear slip-on shoes.  Thank you for allowing [place] to care for you!   --  Invasive Cardiovascular services

## 2022-07-04 NOTE — Telephone Encounter (Signed)
PERCERT:   LEFT HEART CATH   07-12-2022  DR. HARDING

## 2022-07-07 ENCOUNTER — Other Ambulatory Visit (HOSPITAL_COMMUNITY)
Admission: RE | Admit: 2022-07-07 | Discharge: 2022-07-07 | Disposition: A | Discharge status: Home / Self Care | Payer: BC Managed Care – PPO | Source: Ambulatory Visit | Attending: Cardiology | Admitting: Cardiology

## 2022-07-07 DIAGNOSIS — E782 Mixed hyperlipidemia: Secondary | ICD-10-CM

## 2022-07-07 DIAGNOSIS — I25119 Atherosclerotic heart disease of native coronary artery with unspecified angina pectoris: Secondary | ICD-10-CM | POA: Insufficient documentation

## 2022-07-07 DIAGNOSIS — I200 Unstable angina: Secondary | ICD-10-CM | POA: Diagnosis present

## 2022-07-07 DIAGNOSIS — I358 Other nonrheumatic aortic valve disorders: Secondary | ICD-10-CM | POA: Diagnosis present

## 2022-07-07 LAB — CBC
HCT: 33.4 % — ABNORMAL LOW (ref 36.0–46.0)
Hemoglobin: 11.2 g/dL — ABNORMAL LOW (ref 12.0–15.0)
MCH: 27.9 pg (ref 26.0–34.0)
MCHC: 33.5 g/dL (ref 30.0–36.0)
MCV: 83.3 fL (ref 80.0–100.0)
Platelets: 210 10*3/uL (ref 150–400)
RBC: 4.01 MIL/uL (ref 3.87–5.11)
RDW: 15 % (ref 11.5–15.5)
WBC: 6.5 10*3/uL (ref 4.0–10.5)
nRBC: 0 % (ref 0.0–0.2)

## 2022-07-07 LAB — BASIC METABOLIC PANEL
Anion gap: 10 (ref 5–15)
BUN: 22 mg/dL (ref 8–23)
CO2: 22 mmol/L (ref 22–32)
Calcium: 8.9 mg/dL (ref 8.9–10.3)
Chloride: 106 mmol/L (ref 98–111)
Creatinine, Ser: 0.89 mg/dL (ref 0.44–1.00)
GFR, Estimated: >60 mL/min (ref >60)
Glucose, Bld: 308 mg/dL — ABNORMAL HIGH (ref 70–99)
Potassium: 3.8 mmol/L (ref 3.5–5.1)
Sodium: 138 mmol/L (ref 135–145)

## 2022-07-07 NOTE — Addendum Note (Signed)
Addended by: Sung Amabile on: 07/07/2022 09:57 AM   Modules accepted: Orders

## 2022-07-07 NOTE — Addendum Note (Signed)
Addended by: Sung Amabile on: 07/07/2022 09:48 AM   Modules accepted: Orders

## 2022-07-10 ENCOUNTER — Telehealth: Payer: Self-pay | Admitting: *Deleted

## 2022-07-10 NOTE — Telephone Encounter (Signed)
Cardiac Catheterization scheduled at [facility] for: Wednesday July 12, 2022 8:30 AM Arrival time and place: [place] Entrance A at: 6:30 AM  Nothing to eat after midnight prior to procedure, clear liquids until 5 AM day of procedure.  Medication instructions: -Hold:  Metformin-day of procedure and 48 hours post procedure  Glipizide-AM of procedure  HCTZ-AM of procedure -Except hold medications usual morning medications can be taken with sips of water including aspirin 81 mg.  Confirmed patient has responsible adult to drive home post procedure and be with patient first 24 hours after arriving home.  Patient reports no new symptoms concerning for COVID-19 in the past 10 days.  Reviewed procedure instructions with patient.

## 2022-07-12 ENCOUNTER — Encounter (HOSPITAL_COMMUNITY)
Admission: RE | Disposition: A | Discharge status: Home / Self Care | Payer: Self-pay | Source: Home / Self Care | Attending: Cardiology

## 2022-07-12 ENCOUNTER — Encounter (HOSPITAL_COMMUNITY): Payer: Self-pay | Admitting: Cardiology

## 2022-07-12 ENCOUNTER — Ambulatory Visit (HOSPITAL_COMMUNITY)
Admission: RE | Admit: 2022-07-12 | Discharge: 2022-07-12 | Disposition: A | Discharge status: Home / Self Care | Payer: BC Managed Care – PPO | Attending: Cardiology | Admitting: Cardiology

## 2022-07-12 DIAGNOSIS — Z79899 Other long term (current) drug therapy: Secondary | ICD-10-CM | POA: Diagnosis not present

## 2022-07-12 DIAGNOSIS — I200 Unstable angina: Secondary | ICD-10-CM

## 2022-07-12 DIAGNOSIS — I25118 Atherosclerotic heart disease of native coronary artery with other forms of angina pectoris: Secondary | ICD-10-CM | POA: Diagnosis present

## 2022-07-12 DIAGNOSIS — I25110 Atherosclerotic heart disease of native coronary artery with unstable angina pectoris: Secondary | ICD-10-CM

## 2022-07-12 DIAGNOSIS — Z7982 Long term (current) use of aspirin: Secondary | ICD-10-CM | POA: Insufficient documentation

## 2022-07-12 DIAGNOSIS — Z951 Presence of aortocoronary bypass graft: Secondary | ICD-10-CM | POA: Insufficient documentation

## 2022-07-12 DIAGNOSIS — E782 Mixed hyperlipidemia: Secondary | ICD-10-CM | POA: Diagnosis not present

## 2022-07-12 DIAGNOSIS — I2582 Chronic total occlusion of coronary artery: Secondary | ICD-10-CM | POA: Diagnosis not present

## 2022-07-12 HISTORY — PX: LEFT HEART CATH AND CORS/GRAFTS ANGIOGRAPHY: CATH118250

## 2022-07-12 LAB — GLUCOSE, CAPILLARY
Glucose-Capillary: 376 mg/dL — ABNORMAL HIGH (ref 70–99)
Glucose-Capillary: 266 mg/dL — ABNORMAL HIGH (ref 70–99)

## 2022-07-12 SURGERY — LEFT HEART CATH AND CORS/GRAFTS ANGIOGRAPHY
Anesthesia: LOCAL

## 2022-07-12 MED ORDER — SODIUM CHLORIDE 0.9 % WEIGHT BASED INFUSION: 1.0000 mL/kg/h | INTRAVENOUS | Status: DC

## 2022-07-12 MED ORDER — INSULIN ASPART 100 UNIT/ML IJ SOLN
5.0000 [IU] | Freq: Once | INTRAMUSCULAR | Status: AC
  Administered 2022-07-12: 5 [IU] via SUBCUTANEOUS

## 2022-07-12 MED ORDER — HEPARIN (PORCINE) IN NACL 1000-0.9 UT/500ML-% IV SOLN
INTRAVENOUS | Status: AC
  Filled 2022-07-12: qty 1000

## 2022-07-12 MED ORDER — MIDAZOLAM HCL 2 MG/2ML IJ SOLN
INTRAMUSCULAR | Status: DC | PRN
  Administered 2022-07-12: 1 mg via INTRAVENOUS

## 2022-07-12 MED ORDER — SODIUM CHLORIDE 0.9% FLUSH: 3.0000 mL | Freq: Two times a day (BID) | INTRAVENOUS | Status: DC

## 2022-07-12 MED ORDER — ACETAMINOPHEN 325 MG PO TABS: 650.0000 mg | ORAL_TABLET | ORAL | Status: DC | PRN

## 2022-07-12 MED ORDER — SODIUM CHLORIDE 0.9% FLUSH: 3.0000 mL | INTRAVENOUS | Status: DC | PRN

## 2022-07-12 MED ORDER — VERAPAMIL HCL 2.5 MG/ML IV SOLN
INTRAVENOUS | Status: AC
  Filled 2022-07-12: qty 2

## 2022-07-12 MED ORDER — VERAPAMIL HCL 2.5 MG/ML IV SOLN
INTRAVENOUS | Status: DC | PRN
  Administered 2022-07-12: 10 mL via INTRA_ARTERIAL

## 2022-07-12 MED ORDER — ONDANSETRON HCL 4 MG/2ML IJ SOLN
4.0000 mg | Freq: Four times a day (QID) | INTRAMUSCULAR | Status: DC | PRN
  Administered 2022-07-12: 4 mg via INTRAVENOUS
  Filled 2022-07-12: qty 2

## 2022-07-12 MED ORDER — LABETALOL HCL 5 MG/ML IV SOLN: 10.0000 mg | INTRAVENOUS | Status: DC | PRN

## 2022-07-12 MED ORDER — IOHEXOL 350 MG/ML SOLN
INTRAVENOUS | Status: DC | PRN
  Administered 2022-07-12: 120 mL

## 2022-07-12 MED ORDER — SODIUM CHLORIDE 0.9 % IV SOLN: 250.0000 mL | INTRAVENOUS | Status: DC | PRN

## 2022-07-12 MED ORDER — MORPHINE SULFATE (PF) 2 MG/ML IV SOLN: 2.0000 mg | INTRAVENOUS | Status: DC | PRN

## 2022-07-12 MED ORDER — SODIUM CHLORIDE 0.9 % WEIGHT BASED INFUSION
3.0000 mL/kg/h | INTRAVENOUS | Status: AC
  Administered 2022-07-12: 3 mL/kg/h via INTRAVENOUS

## 2022-07-12 MED ORDER — MIDAZOLAM HCL 2 MG/2ML IJ SOLN
INTRAMUSCULAR | Status: AC
  Filled 2022-07-12: qty 2

## 2022-07-12 MED ORDER — FENTANYL CITRATE (PF) 100 MCG/2ML IJ SOLN
INTRAMUSCULAR | Status: DC | PRN
  Administered 2022-07-12: 25 ug via INTRAVENOUS

## 2022-07-12 MED ORDER — HYDRALAZINE HCL 20 MG/ML IJ SOLN: 10.0000 mg | INTRAMUSCULAR | Status: DC | PRN

## 2022-07-12 MED ORDER — HEPARIN (PORCINE) IN NACL 1000-0.9 UT/500ML-% IV SOLN
INTRAVENOUS | Status: DC | PRN
  Administered 2022-07-12 (×2): 500 mL

## 2022-07-12 MED ORDER — LIDOCAINE HCL (PF) 1 % IJ SOLN
INTRAMUSCULAR | Status: AC
  Filled 2022-07-12: qty 30

## 2022-07-12 MED ORDER — HEPARIN SODIUM (PORCINE) 1000 UNIT/ML IJ SOLN
INTRAMUSCULAR | Status: DC | PRN
  Administered 2022-07-12: 4000 [IU] via INTRAVENOUS

## 2022-07-12 MED ORDER — ASPIRIN 81 MG PO CHEW: 81.0000 mg | CHEWABLE_TABLET | ORAL | Status: DC

## 2022-07-12 MED ORDER — FENTANYL CITRATE (PF) 100 MCG/2ML IJ SOLN
INTRAMUSCULAR | Status: AC
  Filled 2022-07-12: qty 2

## 2022-07-12 MED ORDER — LIDOCAINE HCL (PF) 1 % IJ SOLN
INTRAMUSCULAR | Status: DC | PRN
  Administered 2022-07-12: 2 mL

## 2022-07-12 SURGICAL SUPPLY — 12 items
BAND CMPR LRG ZPHR (HEMOSTASIS) ×1
BAND ZEPHYR COMPRESS 30 LONG (HEMOSTASIS) IMPLANT
CATH INFINITI 5FR MULTPACK ANG (CATHETERS) IMPLANT
ELECT DEFIB PAD ADLT CADENCE (PAD) IMPLANT
GLIDESHEATH SLEND SS 6F .021 (SHEATH) IMPLANT
GUIDEWIRE INQWIRE 1.5J.035X260 (WIRE) IMPLANT
INQWIRE 1.5J .035X260CM (WIRE) ×1
KIT HEART LEFT (KITS) ×1 IMPLANT
PACK CARDIAC CATHETERIZATION (CUSTOM PROCEDURE TRAY) ×1 IMPLANT
SHEATH PROBE COVER 6X72 (BAG) IMPLANT
TRANSDUCER W/STOPCOCK (MISCELLANEOUS) ×1 IMPLANT
TUBING CIL FLEX 10 FLL-RA (TUBING) ×1 IMPLANT

## 2022-07-12 NOTE — Interval H&P Note (Signed)
History and Physical Interval Note:  07/12/2022 8:32 AM  Leslie Cobb  has presented today for surgery, with the diagnosis of exertional angina with known CAD-CABG.  The various methods of treatment have been discussed with the patient and family. After consideration of risks, benefits and other options for treatment, the patient has consented to  Procedure(s): LEFT HEART CATH AND CORS/GRAFTS ANGIOGRAPHY (N/A)  PERCUTANEOUS CORONARY INTERVENTION  as a surgical intervention.  The patient's history has been reviewed, patient examined, no change in status, stable for surgery.  I have reviewed the patient's chart and labs.  Questions were answered to the patient's satisfaction.    Cath Lab Visit (complete for each Cath Lab visit)  Clinical Evaluation Leading to the Procedure:   ACS: No.  Non-ACS:    Anginal Classification: CCS III  Anti-ischemic medical therapy: Maximal Therapy (2 or more classes of medications)  Non-Invasive Test Results: No non-invasive testing performed  Prior CABG: Previous CABG   Glenetta Hew

## 2022-07-12 NOTE — Progress Notes (Signed)
Patients CBG is 376 and complains of nausea. Rennis Harding, RN notified and made aware. Awaiting further orders.

## 2022-08-11 ENCOUNTER — Encounter: Payer: Self-pay | Admitting: Nurse Practitioner

## 2022-08-11 ENCOUNTER — Ambulatory Visit: Payer: BC Managed Care – PPO | Attending: Nurse Practitioner | Admitting: Nurse Practitioner

## 2022-08-11 VITALS — BP 160/82 | HR 78 | Ht 64.0 in | Wt 188.0 lb

## 2022-08-11 DIAGNOSIS — I2510 Atherosclerotic heart disease of native coronary artery without angina pectoris: Secondary | ICD-10-CM

## 2022-08-11 DIAGNOSIS — E785 Hyperlipidemia, unspecified: Secondary | ICD-10-CM

## 2022-08-11 DIAGNOSIS — E119 Type 2 diabetes mellitus without complications: Secondary | ICD-10-CM

## 2022-08-11 DIAGNOSIS — I10 Essential (primary) hypertension: Secondary | ICD-10-CM

## 2022-08-11 DIAGNOSIS — F439 Reaction to severe stress, unspecified: Secondary | ICD-10-CM

## 2022-08-11 DIAGNOSIS — K219 Gastro-esophageal reflux disease without esophagitis: Secondary | ICD-10-CM

## 2022-08-11 DIAGNOSIS — E669 Obesity, unspecified: Secondary | ICD-10-CM

## 2022-08-11 MED ORDER — METOPROLOL TARTRATE 50 MG PO TABS: 50.0000 mg | ORAL_TABLET | Freq: Two times a day (BID) | ORAL | 1 refills | Status: DC

## 2022-08-11 MED ORDER — OMEPRAZOLE 20 MG PO CPDR: 20.0000 mg | DELAYED_RELEASE_CAPSULE | Freq: Every day | ORAL | 1 refills | Status: DC

## 2022-08-11 NOTE — Progress Notes (Signed)
Cardiology Office Note:    Date:  08/11/2022   ID:  [name], DOB 10-29-1955, MRN [id number]  PCP:  Curlene Labrum, MD   [facility] Cardiologist:  Rozann Lesches, MD     Referring MD: Curlene Labrum, MD   CC: Here for follow-up post cardiac catheterization  History of Present Illness:    Leslie Cobb is a 66 y.o. female with a hx of the following:  CAD, s/p CABG in 2019 HLD HTN T2DM Obesity  Patient is a 66 year old female with past medical history as mentioned above.  Last seen by Dr. Domenic Polite on July 04, 2022 for evaluation of chest pain.  Previous EKG done at Dr. Lizbeth Bark office revealed no acute ST segment changes, troponin was normal.  Has stated within the past few weeks she had been experiencing recurring chest discomfort, thought it was indigestion.  Blood pressure has been elevated.  Options were discussed and decided to pursue diagnostic cardiac cath, Imdur 30 mg daily was added to medication regimen as well.  Underwent left heart cath by Dr. Ellyn Hack on July 12, 2022 that revealed stable, severe native vessel disease with 4 of 4 patent grafts, no obvious culprit lesion to explain the patient's symptoms, normal LVEF and EDP.  Today she presents for follow-up evaluation after her cardiac catheterization.  She states she is doing well.  She did have 1 episode of a chest ache after heart cath recently, lasted for about 30 minutes in duration, attributes this to indigestion or stress.  She states she has been under stress recently.  Denies any recurrent chest pain since.  Denies any shortness of breath, palpitations, syncope, presyncope, dizziness, orthopnea, PND, any acute bleeding, swelling, significant weight changes, or claudication.  SBP at home runs around 140-145.  Denies any other questions or concerns today.   Past Medical History:  Diagnosis Date   CAD (coronary artery disease)    a. 02/2018: cath due to  unstable angina --> showed multivessel CAD with CABG recommended. b. 02/20/2018: s/p CABG with LIMA-LAD, Seq SVG-Ramus 1 and 2, and SVG-PLB    Hyperlipidemia    Hypertension    Low back pain 07/2013   Obesity    Type 2 diabetes mellitus ([place])    Vaginal Pap smear, abnormal     Past Surgical History:  Procedure Laterality Date   BREAST SURGERY Left    CATARACT EXTRACTION     CORONARY ARTERY BYPASS GRAFT N/A 02/20/2018   Procedure: CORONARY ARTERY BYPASS GRAFTING (CABG) x 4 (LIMA to LAD, SVG SEQUENTIALLY to RAMUS 1 and 2, SVG to PLB) with EVH from [place] and LEFT INTERNAL MAMMARY ARTERY HARVEST;  Surgeon: Melrose Nakayama, MD;  Location: [facility];  Service: Open Heart Surgery;  Laterality: N/A;   GALLBLADDER SURGERY     LEFT HEART CATH AND CORONARY ANGIOGRAPHY N/A 02/18/2018   Procedure: LEFT HEART CATH AND CORONARY ANGIOGRAPHY;  Surgeon: Leonie Man, MD;  Location: [facility];  Service: Cardiovascular;  Laterality: N/A;   LEFT HEART CATH AND CORS/GRAFTS ANGIOGRAPHY N/A 07/12/2022   Procedure: LEFT HEART CATH AND CORS/GRAFTS ANGIOGRAPHY;  Surgeon: Leonie Man, MD;  Location: [facility];  Service: Cardiovascular;  Laterality: N/A;   TEE WITHOUT CARDIOVERSION N/A 02/20/2018   Procedure: TRANSESOPHAGEAL ECHOCARDIOGRAM (TEE);  Surgeon: Melrose Nakayama, MD;  Location: [facility];  Service: Open Heart Surgery;  Laterality: N/A;    Current Medications: Current Meds  Medication Sig  acetaminophen (TYLENOL) 500 MG tablet Take 2 tablets (1,000 mg total) by mouth every 8 (eight) hours as needed for mild pain.   aspirin 81 MG tablet Take 1 tablet (81 mg total) by mouth daily.   atorvastatin (LIPITOR) 80 MG tablet Take 1 tablet (80 mg total) by mouth daily at 6 PM.   Cholecalciferol (VITAMIN D-3) 1000 units CAPS Take 1,000 Units by mouth daily.    Cyanocobalamin (VITAMIN B 12 PO) Take 1,000 mcg by mouth 2 (two) times a week. Saturday and Sunday    fluticasone (FLONASE) 50 MCG/ACT nasal spray Place 1 spray into both nostrils daily as needed for allergies.   GLIPIZIDE XL 10 MG 24 hr tablet Take 10 mg by mouth in the morning and at bedtime.    hydrochlorothiazide (HYDRODIURIL) 25 MG tablet Take 25 mg by mouth daily.   isosorbide mononitrate (IMDUR) 30 MG 24 hr tablet Take 1 tablet (30 mg total) by mouth every evening.   losartan (COZAAR) 50 MG tablet Take 50 mg by mouth at bedtime.   Melatonin 10 MG TABS Take 10 mg by mouth at bedtime.   metFORMIN (GLUCOPHAGE-XR) 500 MG 24 hr tablet Take 1,000 mg by mouth 2 (two) times daily.    Multiple Vitamin (MULTIVITAMIN) tablet Take 1 tablet by mouth daily.   Omega-3 Fatty Acids (OMEGA 3 PO) Take 1 capsule by mouth every other day.   sodium chloride (OCEAN) 0.65 % SOLN nasal spray Place 1 spray into both nostrils daily as needed for congestion.   vitamin C (ASCORBIC ACID) 250 MG tablet Take 250 mg by mouth daily.   metoprolol tartrate (LOPRESSOR) 25 MG tablet Take 1 tablet (25 mg total) by mouth 2 (two) times daily.     Allergies:   Patient has no known allergies.   Social History   Socioeconomic History   Marital status: Single    Spouse name: Not on file   Number of children: Not on file   Years of education: Not on file   Highest education level: Not on file  Occupational History   Not on file  Tobacco Use   Smoking status: Never    Passive exposure: Never   Smokeless tobacco: Never  Vaping Use   Vaping Use: Never used  Substance and Sexual Activity   Alcohol use: Yes    Comment: wine at night   Drug use: No   Sexual activity: Not Currently    Birth control/protection: Post-menopausal  Other Topics Concern   Not on file  Social History Narrative   Not on file   Social Determinants of Health   Financial Resource Strain: Not on file  Food Insecurity: Not on file  Transportation Needs: Not on file  Physical Activity: Not on file  Stress: Not on file  Social Connections: Not  on file     Family History: [name] history includes Arthritis in her mother; Diabetes in her maternal aunt, maternal grandmother, and mother; Heart disease in her brother, brother, brother, father, and mother; Hypertension in her brother and mother; Kidney disease in her mother; Lung cancer in her father.  ROS:   Review of Systems  Constitutional: Negative.   HENT: Negative.    Eyes: Negative.   Respiratory: Negative.    Cardiovascular:  Positive for chest pain. Negative for palpitations, orthopnea, claudication, leg swelling and PND.  Gastrointestinal: Negative.   Genitourinary: Negative.   Musculoskeletal: Negative.   Skin: Negative.   Neurological: Negative.   Endo/Heme/Allergies: Negative.  Psychiatric/Behavioral:  Negative for depression, hallucinations, memory loss, substance abuse and suicidal ideas. The patient is nervous/anxious. The patient does not have insomnia.     Please see the history of present illness.    All other systems reviewed and are negative.  EKGs/Labs/Other Studies Reviewed:    The following studies were reviewed today:   EKG:  EKG is not ordered today.    Left heart cath on July 12, 2022:   Ost LAD to Prox LAD lesion is 95% stenosed.  Prox LAD lesion is 90% stenosed.   Prox Cx lesion is 55% stenosed.  Mid Cx lesion is 60% stenosed.   Ost Ramus to Ramus-1 lesion is 99% stenosed.  Lat Ramus-1 lesion is 100% stenosed.   Ramus-2 lesion is 100% stenosed.   Dist RCA lesion is 40% stenosed.  1st RPL lesion is 100% stenosed.   -------------------GRAFTS-------------------   LIMA-LAD graft was visualized by angiography and is normal in caliber.  The graft exhibits no disease. There is competitive flow.   Seq SVG- RI1-RI2 graft was visualized by angiography and is moderate in size.  The graft exhibits no disease. The flow is not reversed.  There is no competitive flow   SVG graft was visualized by angiography and is small. The graft exhibits  mild diffuse disease. Origin to Prox Graft lesion is 40% stenosed.   ----------------------------------------------------------------------   The left ventricular systolic function is normal. The left ventricular ejection fraction is 55-65% by visual estimate.   LV end diastolic pressure is normal.   There is no aortic valve stenosis.   POSTOP DIAGNOSES Stable severe native vessel disease with 4 of 4 patent grafts: 95% proximal LAD at SP1 (competitive flow from LIMA graft),  Now essentially CTO of both branches of the RI with patent sequential SVG to RI 1 and RI2,  Moderate distal RCA disease with patent PDA and RPL 2, progression to occluded RPL 1 with widely patent SVG to RPL 1. No obvious culprit lesion to explain the patient's symptoms.  To treat the LAD would mean jeopardizing the LIMA graft, with the only benefit of potentially helping improve flow to SP1. Normal LVEF and EDP.     RECOMMENDATIONS Consider potentially nonanginal or microvascular cause for chest pain Continue to titrate GDMT for CAD       TEE on February 20, 2018: Right ventricle: Normal cavity size, wall thickness and ejection  fraction.   Tricuspid valve: Trace regurgitation.   Septum: No Patent Foramen Ovale present.   Left atrium: Patent foramen ovale not present.    Left heart cath on February 18, 2018: Ost LAD to Prox LAD lesion is 95% stenosed. Prox LAD lesion is 80% stenosed. Ost Ramus to Ramus lesion is 90% stenosed. Ramus lesion is 45% stenosed. Lat Ramus lesion is 100% stenosed. Prox Cx lesion is 85% stenosed. Mid Cx lesion is 70% stenosed. 1st RPLB lesion is 95% stenosed. The left ventricular systolic function is normal. The left ventricular ejection fraction is 55-65% by visual estimate. LV end diastolic pressure is mildly elevated.   Severe Multivessel CAD with Class III Angina Normal LVEF with mildly elevated LVEDP     Plan: Admit for IV Heparin & CVTS consultation -- CABG vs. Multivessel  PCI. Convert to High Dose Statin Continue Beta Blocker Convert from Metformin to SSI   Myoview on Feb 06, 2018: Nonspecific ST segment changes in the high lateral leads and aVR. Medium sized, mild intensity, partially reversible anterolateral defect from mid to apical level  consistent with ischemia although breast attenuation is also present. This is an intermediate risk study. Nuclear stress EF: 64%.  2D echocardiogram on Jan 23, 2018: - Left ventricle: The cavity size was normal. Wall thickness was    increased in a pattern of mild LVH. Systolic function was normal.    The estimated ejection fraction was in the range of 55% to 60%.    Wall motion was normal; there were no regional wall motion    abnormalities.  - Aortic valve: Mildly to moderately calcified annulus. Trileaflet;    mildly calcified leaflets.  - Mitral valve: There was trivial regurgitation.  - Right atrium: Central venous pressure (est): 3 mm Hg.  - Atrial septum: No defect or patent foramen ovale was identified.  - Tricuspid valve: There was trivial regurgitation.  - Pulmonary arteries: Systolic pressure could not be accurately    estimated.  - Pericardium, extracardiac: A prominent pericardial fat pad was    present.  Recent Labs: 07/07/2022: BUN 22; Creatinine, Ser 0.89; Hemoglobin 11.2; Platelets 210; Potassium 3.8; Sodium 138  Recent Lipid Panel    Component Value Date/Time   CHOL 132 02/20/2018 0447   TRIG 143 02/20/2018 0447   HDL 46 02/20/2018 0447   CHOLHDL 2.9 02/20/2018 0447   VLDL 29 02/20/2018 0447   LDLCALC 57 02/20/2018 0447     Risk Assessment/Calculations:      HYPERTENSION CONTROL Vitals:   08/11/22 1000 08/11/22 1015  BP: (!) 160/80 (!) 160/82    The patient's blood pressure is elevated above target today.  In order to address the patient's elevated BP: A current anti-hypertensive medication was adjusted today.; Follow up with general cardiology has been recommended.             Physical Exam:    VS:  BP (!) 160/82 (BP Location: Right Arm, Patient Position: Sitting, Cuff Size: Normal)   Pulse 78   Ht 5\' 4"  (1.626 m)   Wt 188 lb (85.3 kg)   SpO2 96%   BMI 32.27 kg/m     Wt Readings from Last 3 Encounters:  08/11/22 188 lb (85.3 kg)  07/12/22 185 lb (83.9 kg)  07/04/22 187 lb 3.2 oz (84.9 kg)     GEN: Obese 66 y.o. Caucasian female in no acute distress HEENT: Normal NECK: No JVD; No carotid bruits CARDIAC: S1/S2, RRR, no murmurs, rubs, gallops; 2+ peripheral pulses throughout, strong and equal bilaterally; right radial site from cardiac catheterization is healing well, and there is no evidence of hematoma, bruising, erythema, or any sign of infection. RESPIRATORY:  Clear and diminished to auscultation without rales, wheezing or rhonchi  MUSCULOSKELETAL:  No edema; No deformity  SKIN: Warm and dry NEUROLOGIC:  Alert and oriented x 3 PSYCHIATRIC:  Normal affect   ASSESSMENT:    1. Coronary artery disease involving native heart without angina pectoris, unspecified vessel or lesion type   2. Hypertension, unspecified type   3. Hyperlipidemia LDL goal <70   4. Type 2 diabetes mellitus without complication, without long-term current use of insulin (HCC)   5. Obesity (BMI 30-39.9)   6. Stress   7. Gastroesophageal reflux disease, unspecified whether esophagitis present    PLAN:    In order of problems listed above:  Coronary artery disease, status post CABG in 2019 Had 1 episode of atypical chest pain, lasted 30 minutes, and has not recurred since.  Does sound to be either related to GERD or stress (see below).  Continue aspirin, Lipitor, Imdur,  losartan.  Will increase Lopressor to 50 mg twice daily. Heart healthy diet and regular cardiovascular exercise encouraged.   Hypertension Initial BP on arrival 160/80, repeat BP by me 160/82.  BP at home systolic is around 140-145.  Will increase Lopressor as mentioned above.  BP log given in office today  and discussed to monitor BP at home at least 2 hours after medications and sitting for 5-10 minutes.  She will let me know her average readings in the next 6 to 8 weeks via MyChart.  Plan to increase losartan if SBP is not at goal by next OV. Heart healthy diet and regular cardiovascular exercise encouraged.   Hyperlipidemia Labs from May 2023 revealed total cholesterol 115, HDL 44, LDL 44, and triglycerides 093. Continue Lipitor and omega-3 fatty acids. Heart healthy diet and regular cardiovascular exercise encouraged.   Type 2 diabetes Last A1c 7.8% in May 2023.  Continue current medication regimen.  This is being managed by PCP.  Obesity BMI today 32.27. Weight loss via diet and exercise encouraged. Discussed the impact being overweight would have on cardiovascular risk. Heart healthy diet and regular cardiovascular exercise encouraged.   Stress Does admit to recent stress recently, therefore could be most likely etiology for chest pain as mentioned above.  Offered resources including counseling services for stress management, she politely declined. Continue to follow with PCP. Heart healthy diet and regular cardiovascular exercise encouraged.   GERD Another possible etiology for recent episode of chest pain as mentioned above.  She has been on omeprazole in the past, therefore we will start omeprazole 20 mg daily. Heart healthy diet and regular cardiovascular exercise encouraged.    8.  Disposition: Follow-up with Dr. Diona Browner in 4 to 5 months or sooner if anything changes.   Medication Adjustments/Labs and Tests Ordered: Current medicines are reviewed at length with the patient today.  Concerns regarding medicines are outlined above.  No orders of the defined types were placed in this encounter.  Meds ordered this encounter  Medications   omeprazole (PRILOSEC) 20 MG capsule    Sig: Take 1 capsule (20 mg total) by mouth daily.    Dispense:  90 capsule    Refill:  1    08/11/22 New  Start   metoprolol tartrate (LOPRESSOR) 50 MG tablet    Sig: Take 1 tablet (50 mg total) by mouth 2 (two) times daily.    Dispense:  180 tablet    Refill:  1    08/11/22 Dose Increase    Patient Instructions  Medication Instructions:  Your physician has recommended you make the following change in your medication:  Start omeprazole 20 mg once a day Increase metoprolol tartrate to 50 mg twice a day Continue all other medications as directed  Labwork: none  Testing/Procedures: none  Follow-Up:  Your physician recommends that you schedule a follow-up appointment in: 4-5 months  Any Other Special Instructions Will Be Listed Below (If Applicable).  If you need a refill on your cardiac medications before your next appointment, please call your pharmacy.    SignedSharlene Dory, NP  08/11/2022 12:15 PM    [facility]

## 2022-08-11 NOTE — Patient Instructions (Addendum)
Medication Instructions:  Your physician has recommended you make the following change in your medication:  Start omeprazole 20 mg once a day Increase metoprolol tartrate to 50 mg twice a day Continue all other medications as directed  Labwork: none  Testing/Procedures: none  Follow-Up:  Your physician recommends that you schedule a follow-up appointment in: 4-5 months  Any Other Special Instructions Will Be Listed Below (If Applicable).  If you need a refill on your cardiac medications before your next appointment, please call your pharmacy.

## 2022-09-19 ENCOUNTER — Ambulatory Visit: Payer: BC Managed Care – PPO | Admitting: Adult Health

## 2022-10-18 ENCOUNTER — Ambulatory Visit: Payer: BC Managed Care – PPO | Admitting: Adult Health

## 2022-10-24 ENCOUNTER — Encounter: Payer: Self-pay | Admitting: Adult Health

## 2022-10-24 ENCOUNTER — Other Ambulatory Visit (HOSPITAL_COMMUNITY)
Admission: RE | Admit: 2022-10-24 | Discharge: 2022-10-24 | Disposition: A | Discharge status: Home / Self Care | Payer: Medicare PPO | Source: Ambulatory Visit | Attending: Adult Health | Admitting: Adult Health

## 2022-10-24 ENCOUNTER — Ambulatory Visit: Payer: Medicare PPO | Admitting: Adult Health

## 2022-10-24 VITALS — BP 174/89 | HR 68 | Ht 64.0 in | Wt 188.0 lb

## 2022-10-24 DIAGNOSIS — Z1211 Encounter for screening for malignant neoplasm of colon: Secondary | ICD-10-CM | POA: Diagnosis not present

## 2022-10-24 DIAGNOSIS — Z01419 Encounter for gynecological examination (general) (routine) without abnormal findings: Secondary | ICD-10-CM

## 2022-10-24 DIAGNOSIS — Z1151 Encounter for screening for human papillomavirus (HPV): Secondary | ICD-10-CM | POA: Insufficient documentation

## 2022-10-24 DIAGNOSIS — Z1231 Encounter for screening mammogram for malignant neoplasm of breast: Principal | ICD-10-CM

## 2022-10-24 LAB — HEMOCCULT GUIAC POC 1CARD (OFFICE): Fecal Occult Blood, POC: NEGATIVE

## 2022-10-24 NOTE — Progress Notes (Signed)
Patient ID: Leslie Cobb, female   DOB: October 21, 1955, 67 y.o.   MRN: [id number] History of Present Illness: Leslie Cobb is a 67 year old white female,single, PM, in for a well woman gyn exam and pap. She denies any vaginal bleeding.  PCP is Dr Pleas Koch.   Current Medications, Allergies, Past Medical History, Past Surgical History, Family History and Social History were reviewed in [employer] record.     Review of Systems: Patient denies any headaches, hearing loss, fatigue, blurred vision, shortness of breath, chest pain(had some in December and had cardiac cath), abdominal pain, problems with bowel movements, urination, or intercourse(not active often). No joint pain or mood swings.  Denies any vaginal bleeding,   Physical Exam:BP (!) 174/89 (BP Location: Left Arm, Patient Position: Sitting, Cuff Size: Normal)   Pulse 68   Ht 5' 4"$  (1.626 m)   Wt 188 lb (85.3 kg)   BMI 32.27 kg/m   General:  Well developed, well nourished, no acute distress Skin:  Warm and dry Neck:  Midline trachea, normal thyroid, good ROM, no lymphadenopathy,no carotid bruits heard  Lungs; Clear to auscultation bilaterally Breast:  No dominant palpable mass, retraction, or nipple discharge Cardiovascular: Regular rate and rhythm Abdomen:  Soft, non tender, no hepatosplenomegaly Pelvic:  External genitalia is normal in appearance, no lesions.  The vagina is pale. Urethra has no lesions or masses. The cervix is smooth, pap with HR HPV genotyping performed.  Uterus is felt to be normal size, shape, and contour.  No adnexal masses or tenderness noted.Bladder is non tender, no masses felt. Rectal: Good sphincter tone, no polyps, or hemorrhoids felt.  Hemoccult negative. Extremities/musculoskeletal:  No swelling or varicosities noted, no clubbing or cyanosis Psych:  No mood changes, alert and cooperative,seems happy AA is 0 Fall risk is low    10/24/2022    9:42 AM 04/15/2019    8:51 AM 12/12/2017     4:07 PM  Depression screen PHQ 2/9  Decreased Interest 0 0 0  Down, Depressed, Hopeless 0 0 0  PHQ - 2 Score 0 0 0  Altered sleeping 1    Tired, decreased energy 1    Change in appetite 0    Feeling bad or failure about yourself  0    Trouble concentrating 0    Moving slowly or fidgety/restless 0    Suicidal thoughts 0    PHQ-9 Score 2         10/24/2022    9:42 AM  GAD 7 : Generalized Anxiety Score  Nervous, Anxious, on Edge 0  Control/stop worrying 0  Worry too much - different things 0  Trouble relaxing 1  Restless 1  Easily annoyed or irritable 1  Afraid - awful might happen 0  Total GAD 7 Score 3    Upstream - 10/24/22 0956       Pregnancy Intention Screening   Does the patient want to become pregnant in the next year? No    Does the patient's partner want to become pregnant in the next year? No    Would the patient like to discuss contraceptive options today? No      Contraception Wrap Up   Current Method No Method - Other Reason   postmenopausal   Reason for No Current Contraceptive Method at Intake ([facility] Only) Other    End Method No Method - Other Reason   postmenopausal   Contraception Counseling Provided No  Examination chaperoned by Levy Pupa LPN  Impression and plan:  1. Encounter for gynecological examination with Papanicolaou smear of cervix Pap sent Pap in 3 years if normal Physical with PCP Labs with PCP Had negative mammogram 10/24/21 Colonoscopy per GI  2. Encounter for screening fecal occult blood testing Hemoccult was negative

## 2022-10-26 ENCOUNTER — Ambulatory Visit: Admit: 2022-10-26 | Discharge: 2022-10-27 | Payer: MEDICARE

## 2022-11-02 LAB — CYTOLOGY - PAP
Comment: NEGATIVE
Comment: NEGATIVE
Diagnosis: UNDETERMINED — AB
HPV 16: NEGATIVE
HPV 18 / 45: NEGATIVE
High risk HPV: POSITIVE — AB

## 2022-11-06 ENCOUNTER — Encounter: Payer: Self-pay | Admitting: Adult Health

## 2022-11-06 DIAGNOSIS — R87610 Atypical squamous cells of undetermined significance on cytologic smear of cervix (ASC-US): Secondary | ICD-10-CM | POA: Insufficient documentation

## 2022-12-11 NOTE — Progress Notes (Unsigned)
    Cardiology Office Note  Date: 12/12/2022   ID: Leslie Cobb, DOB 06-22-1956, MRN [id number]  History of Present Illness: Leslie Cobb is a 67 y.o. female last seen in December 2023 by Ms. Arlington Calix NP, I reviewed the note.  She is here today for a routine visit.  Reports no angina and stable NYHA class II dyspnea on exertion.  No palpitations or syncope.  I reviewed her medications, she reports compliance with therapy.  Has not been checking blood pressure at home regularly.  We discussed increasing Cozaar to 100 mg daily.  She has follow-up later this month with lab work per PCP.  I reviewed the results of her cardiac catheterization from November last year.  Physical Exam: VS:  BP (!) 162/84   Pulse 65   Ht 5\' 4"  (1.626 m)   Wt 188 lb 6.4 oz (85.5 kg)   SpO2 97%   BMI 32.34 kg/m , BMI Body mass index is 32.34 kg/m.  Wt Readings from Last 3 Encounters:  12/12/22 188 lb 6.4 oz (85.5 kg)  10/24/22 188 lb (85.3 kg)  08/11/22 188 lb (85.3 kg)    General: Patient appears comfortable at rest. HEENT: Conjunctiva and lids normal. Neck: Supple, no elevated JVP or carotid bruits. Lungs: Clear to auscultation, nonlabored breathing at rest. Cardiac: Regular rate and rhythm, no S3 or significant systolic murmur. Extremities: No pitting edema.  ECG:  An ECG dated 06/14/2022 was personally reviewed today and demonstrated:  Sinus rhythm with leftward axis.  Labwork: May 2023: Cholesterol 115, triglycerides 160, HDL 44, LDL 44 07/07/2022: BUN 22; Creatinine, Ser 0.89; Hemoglobin 11.2; Platelets 210; Potassium 3.8; Sodium 138   Other Studies Reviewed Today:  No interval cardiac testing for review today.  Assessment and Plan:  1.  Multivessel CAD status post CABG in 2019 including LIMA to LAD, SVG to OM1 and OM 2, and SVG to PLB.  Follow-up cardiac catheterization in November 2023 revealed patent bypass grafts with plan to continue medical therapy.  LVEF normal at  angiography.  Continue observation on aspirin, Lipitor, Imdur, Lopressor, and Cozaar.  2.  Essential hypertension.  Increase Cozaar to 100 mg daily.  Recommended that she check blood pressure with home cuff intermittently.  3.  Mixed hyperlipidemia.  LDL 44 in May of last year.  Continue Lipitor and keep follow-up with PCP for lab work later this month.  Disposition:  Follow up  6 months.  Signed, Satira Sark, M.D., F.A.C.C. [place] [facility]

## 2022-12-12 ENCOUNTER — Ambulatory Visit: Payer: Medicare PPO | Attending: Cardiology | Admitting: Cardiology

## 2022-12-12 ENCOUNTER — Encounter: Payer: Self-pay | Admitting: Cardiology

## 2022-12-12 VITALS — BP 162/84 | HR 65 | Ht 64.0 in | Wt 188.4 lb

## 2022-12-12 DIAGNOSIS — I25119 Atherosclerotic heart disease of native coronary artery with unspecified angina pectoris: Secondary | ICD-10-CM | POA: Diagnosis not present

## 2022-12-12 DIAGNOSIS — I10 Essential (primary) hypertension: Secondary | ICD-10-CM | POA: Diagnosis not present

## 2022-12-12 DIAGNOSIS — E782 Mixed hyperlipidemia: Secondary | ICD-10-CM | POA: Diagnosis not present

## 2022-12-12 MED ORDER — LOSARTAN POTASSIUM 100 MG PO TABS: 100.0000 mg | ORAL_TABLET | Freq: Every day | ORAL | 3 refills | Status: DC

## 2022-12-12 NOTE — Patient Instructions (Signed)
Medication Instructions:  INCREASE Losartan to 100 mg daily  Labwork: None today  Testing/Procedures: None today  Follow-Up: 6 months  Any Other Special Instructions Will Be Listed Below (If Applicable).  If you need a refill on your cardiac medications before your next appointment, please call your pharmacy.

## 2022-12-21 ENCOUNTER — Other Ambulatory Visit: Payer: Self-pay | Admitting: Cardiology

## 2023-01-11 DIAGNOSIS — E7849 Other hyperlipidemia: Secondary | ICD-10-CM | POA: Diagnosis not present

## 2023-01-11 DIAGNOSIS — I25759 Atherosclerosis of native coronary artery of transplanted heart with unspecified angina pectoris: Secondary | ICD-10-CM | POA: Diagnosis not present

## 2023-01-11 DIAGNOSIS — E1169 Type 2 diabetes mellitus with other specified complication: Secondary | ICD-10-CM | POA: Diagnosis not present

## 2023-01-11 DIAGNOSIS — F4321 Adjustment disorder with depressed mood: Secondary | ICD-10-CM | POA: Diagnosis not present

## 2023-01-11 DIAGNOSIS — Z951 Presence of aortocoronary bypass graft: Secondary | ICD-10-CM | POA: Diagnosis not present

## 2023-01-11 DIAGNOSIS — Z6829 Body mass index (BMI) 29.0-29.9, adult: Secondary | ICD-10-CM | POA: Diagnosis not present

## 2023-01-11 DIAGNOSIS — G4733 Obstructive sleep apnea (adult) (pediatric): Secondary | ICD-10-CM | POA: Diagnosis not present

## 2023-01-11 DIAGNOSIS — I10 Essential (primary) hypertension: Secondary | ICD-10-CM | POA: Diagnosis not present

## 2023-01-25 ENCOUNTER — Other Ambulatory Visit: Payer: Self-pay | Admitting: Nurse Practitioner

## 2023-01-28 ENCOUNTER — Other Ambulatory Visit: Payer: Self-pay | Admitting: Nurse Practitioner

## 2023-04-12 DIAGNOSIS — E1169 Type 2 diabetes mellitus with other specified complication: Secondary | ICD-10-CM | POA: Diagnosis not present

## 2023-04-16 DIAGNOSIS — E1169 Type 2 diabetes mellitus with other specified complication: Secondary | ICD-10-CM | POA: Diagnosis not present

## 2023-04-16 DIAGNOSIS — F4321 Adjustment disorder with depressed mood: Secondary | ICD-10-CM | POA: Diagnosis not present

## 2023-04-16 DIAGNOSIS — I10 Essential (primary) hypertension: Secondary | ICD-10-CM | POA: Diagnosis not present

## 2023-04-16 DIAGNOSIS — E7849 Other hyperlipidemia: Secondary | ICD-10-CM | POA: Diagnosis not present

## 2023-04-16 DIAGNOSIS — Z6829 Body mass index (BMI) 29.0-29.9, adult: Secondary | ICD-10-CM | POA: Diagnosis not present

## 2023-04-16 DIAGNOSIS — I25759 Atherosclerosis of native coronary artery of transplanted heart with unspecified angina pectoris: Secondary | ICD-10-CM | POA: Diagnosis not present

## 2023-04-16 DIAGNOSIS — Z951 Presence of aortocoronary bypass graft: Secondary | ICD-10-CM | POA: Diagnosis not present

## 2023-04-27 ENCOUNTER — Other Ambulatory Visit: Payer: Self-pay | Admitting: Nurse Practitioner

## 2023-06-20 ENCOUNTER — Encounter: Payer: Self-pay | Admitting: Cardiology

## 2023-06-20 ENCOUNTER — Ambulatory Visit: Payer: Medicare PPO | Attending: Cardiology | Admitting: Cardiology

## 2023-06-20 VITALS — BP 142/80 | HR 71 | Ht 64.0 in | Wt 175.4 lb

## 2023-06-20 DIAGNOSIS — I25119 Atherosclerotic heart disease of native coronary artery with unspecified angina pectoris: Secondary | ICD-10-CM

## 2023-06-20 DIAGNOSIS — I10 Essential (primary) hypertension: Secondary | ICD-10-CM

## 2023-06-20 DIAGNOSIS — E782 Mixed hyperlipidemia: Secondary | ICD-10-CM | POA: Diagnosis not present

## 2023-06-20 NOTE — Progress Notes (Signed)
    Cardiology Office Note  Date: 06/20/2023   ID: Cenia Zaragosa, DOB 1956-05-27, MRN [id number]  History of Present Illness: Leslie Cobb is a 67 y.o. female last seen in April.  She is here for a routine visit.  Reports no angina on current regimen, stable NYHA class II dyspnea.  No palpitations or syncope.  I reviewed her medications.  Current cardiovascular regimen includes aspirin, Lipitor, Imdur, Cozaar, Lopressor, omega-3 supplements, and HCTZ.  She has not been checking her blood pressures regularly at home.  Trend is improving since last visit.  She has also lost some weight.  ECG today shows sinus rhythm with nonspecific ST changes.  Physical Exam: VS:  BP (!) 142/80 (BP Location: Right Arm, Patient Position: Sitting, Cuff Size: Normal)   Pulse 71   Ht 5\' 4"  (1.626 m)   Wt 175 lb 6.4 oz (79.6 kg)   SpO2 97%   BMI 30.11 kg/m , BMI Body mass index is 30.11 kg/m.  Wt Readings from Last 3 Encounters:  06/20/23 175 lb 6.4 oz (79.6 kg)  12/12/22 188 lb 6.4 oz (85.5 kg)  10/24/22 188 lb (85.3 kg)    General: Patient appears comfortable at rest. HEENT: Conjunctiva and lids normal. Neck: Supple, no elevated JVP or carotid bruits. Lungs: Clear to auscultation, nonlabored breathing at rest. Cardiac: Regular rate and rhythm, no S3, 2/6 systolic murmur. Extremities: No pitting edema.  ECG:  An ECG dated 06/14/2022 was personally reviewed today and demonstrated:  Sinus rhythm with leftward axis.  Labwork: May 2023: Cholesterol 115, triglycerides 160, HDL 44, LDL 44 07/07/2022: BUN 22; Creatinine, Ser 0.89; Hemoglobin 11.2; Platelets 210; Potassium 3.8; Sodium 138   Other Studies Reviewed Today:  No interval cardiac testing for review today.  Assessment and Plan:  1.  Multivessel CAD status post CABG in 2019 including LIMA to LAD, SVG to OM1 and OM 2, and SVG to PLB.  Follow-up cardiac catheterization in November 2023 revealed patent bypass grafts with plan  to continue medical therapy.  LVEF normal at angiography.  She does not describe any angina at this time.  ECG reviewed.  Continue aspirin, Lipitor, Imdur, Cozaar, and Lopressor.   2.  Primary hypertension.  Stage II hypertension.  Not optimally controlled as yet but trend improving.  Continue Cozaar, HCTZ, and Lopressor for now.  She has lost some weight in the interim.  I encouraged her to check her blood pressure and report back on home measurements.   3.  Mixed hyperlipidemia.  LDL 44 in May of last year.  She reports lab work earlier this year with Dr. Leandrew Koyanagi.  Continue Lipitor and omega-3 supplements.   Disposition:  Follow up  6 months.  Signed, Jonelle Sidle, M.D., F.A.C.C.  [facility]

## 2023-06-20 NOTE — Patient Instructions (Signed)
Medication Instructions:  Your physician recommends that you continue on your current medications as directed. Please refer to the Current Medication list given to you today.   Labwork: None today  Testing/Procedures: None today  Follow-Up: 6 months  Any Other Special Instructions Will Be Listed Below (If Applicable).  If you need a refill on your cardiac medications before your next appointment, please call your pharmacy.  

## 2023-06-28 DIAGNOSIS — S39012A Strain of muscle, fascia and tendon of lower back, initial encounter: Secondary | ICD-10-CM | POA: Diagnosis not present

## 2023-06-28 DIAGNOSIS — Z6828 Body mass index (BMI) 28.0-28.9, adult: Secondary | ICD-10-CM | POA: Diagnosis not present

## 2023-06-28 DIAGNOSIS — R030 Elevated blood-pressure reading, without diagnosis of hypertension: Secondary | ICD-10-CM | POA: Diagnosis not present

## 2023-09-08 ENCOUNTER — Other Ambulatory Visit: Payer: Self-pay | Admitting: Cardiology

## 2023-10-17 ENCOUNTER — Other Ambulatory Visit: Payer: Self-pay | Admitting: Cardiology

## 2023-10-18 DIAGNOSIS — E782 Mixed hyperlipidemia: Secondary | ICD-10-CM | POA: Diagnosis not present

## 2023-10-18 DIAGNOSIS — E7849 Other hyperlipidemia: Secondary | ICD-10-CM | POA: Diagnosis not present

## 2023-10-18 DIAGNOSIS — Z1329 Encounter for screening for other suspected endocrine disorder: Secondary | ICD-10-CM | POA: Diagnosis not present

## 2023-10-18 DIAGNOSIS — Z0001 Encounter for general adult medical examination with abnormal findings: Secondary | ICD-10-CM | POA: Diagnosis not present

## 2023-10-18 DIAGNOSIS — Z951 Presence of aortocoronary bypass graft: Secondary | ICD-10-CM | POA: Diagnosis not present

## 2023-10-18 DIAGNOSIS — R945 Abnormal results of liver function studies: Secondary | ICD-10-CM | POA: Diagnosis not present

## 2023-10-18 DIAGNOSIS — E1169 Type 2 diabetes mellitus with other specified complication: Secondary | ICD-10-CM | POA: Diagnosis not present

## 2023-10-20 ENCOUNTER — Other Ambulatory Visit: Payer: Self-pay | Admitting: Nurse Practitioner

## 2023-10-30 ENCOUNTER — Ambulatory Visit: Payer: Medicare PPO | Admitting: Adult Health

## 2023-10-30 ENCOUNTER — Other Ambulatory Visit (HOSPITAL_COMMUNITY)
Admission: RE | Admit: 2023-10-30 | Discharge: 2023-10-30 | Disposition: A | Discharge status: Home / Self Care | Payer: Medicare PPO | Source: Ambulatory Visit | Attending: Adult Health | Admitting: Adult Health

## 2023-10-30 ENCOUNTER — Encounter: Payer: Self-pay | Admitting: Adult Health

## 2023-10-30 VITALS — BP 180/92 | HR 72 | Ht 64.0 in | Wt 182.5 lb

## 2023-10-30 DIAGNOSIS — I2510 Atherosclerotic heart disease of native coronary artery without angina pectoris: Secondary | ICD-10-CM | POA: Diagnosis not present

## 2023-10-30 DIAGNOSIS — Z1211 Encounter for screening for malignant neoplasm of colon: Secondary | ICD-10-CM

## 2023-10-30 DIAGNOSIS — K649 Unspecified hemorrhoids: Secondary | ICD-10-CM | POA: Diagnosis not present

## 2023-10-30 DIAGNOSIS — Z0001 Encounter for general adult medical examination with abnormal findings: Secondary | ICD-10-CM | POA: Diagnosis not present

## 2023-10-30 DIAGNOSIS — Z01419 Encounter for gynecological examination (general) (routine) without abnormal findings: Secondary | ICD-10-CM | POA: Diagnosis not present

## 2023-10-30 DIAGNOSIS — R195 Other fecal abnormalities: Secondary | ICD-10-CM | POA: Diagnosis not present

## 2023-10-30 DIAGNOSIS — Z1151 Encounter for screening for human papillomavirus (HPV): Secondary | ICD-10-CM

## 2023-10-30 DIAGNOSIS — Z6829 Body mass index (BMI) 29.0-29.9, adult: Secondary | ICD-10-CM | POA: Diagnosis not present

## 2023-10-30 DIAGNOSIS — I10 Essential (primary) hypertension: Secondary | ICD-10-CM

## 2023-10-30 DIAGNOSIS — Z124 Encounter for screening for malignant neoplasm of cervix: Secondary | ICD-10-CM | POA: Diagnosis not present

## 2023-10-30 DIAGNOSIS — E1169 Type 2 diabetes mellitus with other specified complication: Secondary | ICD-10-CM | POA: Diagnosis not present

## 2023-10-30 DIAGNOSIS — Z951 Presence of aortocoronary bypass graft: Secondary | ICD-10-CM | POA: Diagnosis not present

## 2023-10-30 DIAGNOSIS — Z23 Encounter for immunization: Secondary | ICD-10-CM | POA: Diagnosis not present

## 2023-10-30 DIAGNOSIS — Z0000 Encounter for general adult medical examination without abnormal findings: Secondary | ICD-10-CM | POA: Diagnosis not present

## 2023-10-30 DIAGNOSIS — E782 Mixed hyperlipidemia: Secondary | ICD-10-CM | POA: Diagnosis not present

## 2023-10-30 DIAGNOSIS — Z1231 Encounter for screening mammogram for malignant neoplasm of breast: Secondary | ICD-10-CM | POA: Diagnosis not present

## 2023-10-30 LAB — HEMOCCULT GUIAC POC 1CARD (OFFICE): Fecal Occult Blood, POC: POSITIVE — AB

## 2023-10-30 NOTE — Progress Notes (Signed)
Patient ID: Leslie Cobb, female   DOB: 1956/06/10, 68 y.o.   MRN: [id number] History of Present Illness: Leslie Cobb is a 68 year old white female, widowed, PM in for a well woman gyn exam and pap. Her pap 10/24/22 was ASCUS +HPV.  She saw Dr Leandrew Koyanagi today and had flu shot and pneumonia shot and mammogram.   PCP is Dr Leandrew Koyanagi   Current Medications, Allergies, Past Medical History, Past Surgical History, Family History and Social History were reviewed in [employer] electronic medical record.     Review of Systems: Patient denies any headaches, hearing loss, fatigue, blurred vision, shortness of breath, chest pain, abdominal pain, problems with bowel movements(metformin upsets stomach, has diarrhea at times), urination, or intercourse(not having sex). No joint pain or mood swings.  Denies any vaginal bleeding    Physical Exam:BP (!) 180/92 (BP Location: Right Arm, Patient Position: Sitting, Cuff Size: Normal)   Pulse 72   Ht 5\' 4"  (1.626 m)   Wt 182 lb 8 oz (82.8 kg)   BMI 31.33 kg/m   General:  Well developed, well nourished, no acute distress Skin:  Warm and dry Neck:  Midline trachea, normal thyroid, good ROM, no lymphadenopathy,no carotid bruits heard  Lungs; Clear to auscultation bilaterally Breast:  No dominant palpable mass, retraction, or nipple discharge Cardiovascular: Regular rate and rhythm Abdomen:  Soft, non tender, no hepatosplenomegaly Pelvic:  External genitalia is normal in appearance, no lesions.  The vagina is pale. Urethra has no lesions or masses. The cervix is smooth, pap with HR HPV genotyping performed.  Uterus is felt to be normal size, shape, and contour.  No adnexal masses or tenderness noted.Bladder is non tender, no masses felt. Rectal: Good sphincter tone, no polyps, + hemorrhoids felt.  Hemoccult positive  Extremities/musculoskeletal:  No swelling or varicosities noted, no clubbing or cyanosis Psych:  No mood changes, alert and cooperative,seems  happy AA is 0 Fall riski slow    10/30/2023    3:31 PM 10/24/2022    9:42 AM 04/15/2019    8:51 AM  Depression screen PHQ 2/9  Decreased Interest 0 0 0  Down, Depressed, Hopeless 0 0 0  PHQ - 2 Score 0 0 0  Altered sleeping 1 1   Tired, decreased energy 0 1   Change in appetite 0 0   Feeling bad or failure about yourself  0 0   Trouble concentrating 0 0   Moving slowly or fidgety/restless 0 0   Suicidal thoughts 0 0   PHQ-9 Score 1 2        10/30/2023    3:31 PM 10/24/2022    9:42 AM  GAD 7 : Generalized Anxiety Score  Nervous, Anxious, on Edge 0 0  Control/stop worrying 1 0  Worry too much - different things 1 0  Trouble relaxing 1 1  Restless 1 1  Easily annoyed or irritable 1 1  Afraid - awful might happen 0 0  Total GAD 7 Score 5 3    Upstream - 10/30/23 1536       Pregnancy Intention Screening   Does the patient want to become pregnant in the next year? N/A    Does the patient's partner want to become pregnant in the next year? N/A    Would the patient like to discuss contraceptive options today? N/A      Contraception Wrap Up   Current Method Abstinence   PM   End Method Abstinence   PM   Contraception  Counseling Provided No            Examination chaperoned by Malachy Mood LPN   Impression and plan: 1. Encounter for gynecological examination with Papanicolaou smear of cervix (Primary) Pap sent Next pap TBD Physical in 1 year Mammogram today at [facility] with PCP - Cytology - PAP( [facility])  2. Encounter for screening fecal occult blood testing Hemoccult was positive  - POCT occult blood stool  3. Fecal occult blood test positive Fecal occult was positive - POCT occult blood stool 3 cards given to take home and bring back   4. Hemorrhoids, unspecified hemorrhoid type  5. Hypertension, unspecified type Take meds and follow up with PCP

## 2023-11-07 ENCOUNTER — Telehealth: Payer: Self-pay | Admitting: *Deleted

## 2023-11-07 ENCOUNTER — Other Ambulatory Visit: Payer: Medicare PPO

## 2023-11-07 ENCOUNTER — Encounter: Payer: Self-pay | Admitting: Adult Health

## 2023-11-07 DIAGNOSIS — Z1211 Encounter for screening for malignant neoplasm of colon: Secondary | ICD-10-CM

## 2023-11-07 DIAGNOSIS — Z1212 Encounter for screening for malignant neoplasm of rectum: Secondary | ICD-10-CM

## 2023-11-07 DIAGNOSIS — R87611 Atypical squamous cells cannot exclude high grade squamous intraepithelial lesion on cytologic smear of cervix (ASC-H): Secondary | ICD-10-CM | POA: Insufficient documentation

## 2023-11-07 LAB — HEMOCCULT GUIAC POC 1CARD (OFFICE)
Card #2 Fecal Occult Blod, POC: NEGATIVE
Fecal Occult Blood, POC: NEGATIVE

## 2023-11-07 LAB — CYTOLOGY - PAP
Comment: NEGATIVE
Comment: NEGATIVE
Comment: NEGATIVE
Diagnosis: HIGH — AB
HPV 16: NEGATIVE
HPV 18 / 45: NEGATIVE
High risk HPV: POSITIVE — AB

## 2023-11-07 NOTE — Telephone Encounter (Signed)
 Pt aware 2 hemocult cards were negative. 1 card wasn't done correctly. JSY

## 2023-11-20 ENCOUNTER — Encounter: Payer: Self-pay | Admitting: Women's Health

## 2023-11-20 ENCOUNTER — Ambulatory Visit: Payer: Medicare PPO | Admitting: Women's Health

## 2023-11-20 VITALS — BP 200/79 | HR 84 | Ht 64.0 in | Wt 189.0 lb

## 2023-11-20 DIAGNOSIS — I10 Essential (primary) hypertension: Secondary | ICD-10-CM

## 2023-11-20 DIAGNOSIS — R87611 Atypical squamous cells cannot exclude high grade squamous intraepithelial lesion on cytologic smear of cervix (ASC-H): Secondary | ICD-10-CM | POA: Diagnosis not present

## 2023-11-20 DIAGNOSIS — R87619 Unspecified abnormal cytological findings in specimens from cervix uteri: Secondary | ICD-10-CM | POA: Insufficient documentation

## 2023-11-20 NOTE — Progress Notes (Signed)
   COLPOSCOPY PROCEDURE NOTE [name] MRN [id number]  Date of birth: August 06, 1956 Subjective Findings:   Leslie Cobb is a 68 y.o. G12P1001 Caucasian female being seen today for a colposcopy. Took bp meds this am, very anxious about procedure. No chest pain, headache.  Indication: Abnormal pap on 10/30/23: ASC-H w/ HRHPV positive: other (not 16, 18/45)  Prior cytology:  Date Result Procedure  2024 ASCUS w/ HRHPV positive: other (not 16, 18/45) None  2019 NILM w/ HRHPV negative None  2018 NILM w/ HRHPV positive: type not specified None  2017 ASCUS w/ HRHPV positive: type not specified Colposcopy: neg  No LMP recorded. Patient is postmenopausal. Contraception: abstinence. Menopausal: yes Hysterectomy: no.   Considering another pregnancy: No New sex partner: no Smoker: no. Immunocompromised: no.   The risks and benefits were explained and informed consent was obtained, and written copy is in chart. Pertinent History Reviewed:   Reviewed past medical,surgical, social, obstetrical and family history.  Reviewed problem list, medications and allergies. Objective Findings & Procedure:   Vitals:   11/20/23 0957 11/20/23 1019  BP: (!) 214/85 (!) 200/79  Pulse: 84 84  Weight: 189 lb (85.7 kg)   Height: 5\' 4"  (1.626 m)   Body mass index is 32.44 kg/m.  No results found for this or any previous visit (from the past 24 hours).   Time out was performed.  Speculum placed in the vagina, cervix fully visualized. SCJ: not visualized  Cervix swabbed x 3 with acetic acid. Every place I touched w/ qtip bled under skin, Dr. Despina Hidden in- states unable to do colpo/would be inadequate  Specimens: 0  Chaperone: Federico Flake  Colposcopic Impression & Plan:   Per Dr. Despina Hidden, unable to perform colpo d/t bleeding under skin of cervix and no visualization of SCJ at all, needs laser cone, he will get scheduled  [facility]- managed by Dr. Diona Browner, took meds this am, very anxious about  procedure, note routed to Dr. Diona Browner, recommends f/u visit w/ him or PCP to see if meds need to be adjusted. Instructed pt to call today for appt   Return in about 1 year (around 11/19/2024) for Pap & physical .  Cheral Marker CNM, [facility] 11/20/2023 10:52 AM

## 2023-11-20 NOTE — Patient Instructions (Signed)
 Cervical Conization  Cervical conization is a procedure in which a cone-shaped portion of the cervix is cut out so that it can be looked at under a microscope (cone biopsy). The cervix is the lowest part of the uterus that opens into the vagina. This procedure is done to check for cancer cells or cells that might turn into cancer (precancerous cells). You may have this procedure if:  You have abnormal bleeding from your cervix.  You had an abnormal Pap test.  Your health care provider finds abnormalities on your cervix during an exam.  A tissue sample will be removed from the cervix using one of the following methods:  Cold knife method. The tissue is cut out with a knife (scalpel).  Loop electrosurgical excision procedure (LEEP) method. The tissue is cut out with a thin wire that can burn (cauterize) the tissue with an electrical current.  Laser treatment method. The tissue is cut out and then cauterized with a laser beam to prevent bleeding.  This procedure is performed in either a health care provider's office or an operating room.  Tell a health care provider about:  Any allergies you have.  All medicines you are taking, including vitamins, herbs, eye drops, creams, and over-the-counter medicines.  Any problems you or family members have had with anesthetic medicines.  Any bleeding problems you have.  Any surgeries you have had.  Any medical conditions you have.  If you have your menstrual period.  Whether you are pregnant or may be pregnant.  What are the risks?  Generally, this is a safe procedure. However, problems may occur, including:  Heavy bleeding for several days or weeks after the procedure.  Allergic reactions to medicines or to dyes or other solutions.  Increased risk of early (preterm) labor in future pregnancies.  Infection. This is rare.  Damage to the cervix or nearby structures or organs. This is rare.  What happens before the procedure?  When to stop eating and drinking  Follow instructions  from your health care provider about what you may eat and drink before your procedure. These may include:  8 hours before your procedure  Stop eating most foods. Do not eat meat, fried foods, or fatty foods.  Eat only light foods, such as toast or crackers.  All liquids are okay except energy drinks and alcohol.  6 hours before your procedure  Stop eating.  Drink only clear liquids, such as water, clear fruit juice, black coffee, plain tea, and sports drinks.  Do not drink energy drinks or alcohol.  2 hours before your procedure  Stop drinking all liquids.  You may be allowed to take medicines with small sips of water.  If you do not follow your health care provider's instructions, your procedure may be delayed or canceled.  Medicines  Ask your health care provider about:  Changing or stopping your regular medicines. This is especially important if you are taking diabetes medicines or blood thinners.  Taking medicines such as aspirin and ibuprofen. These medicines can thin your blood. Do not take these medicines unless your health care provider tells you to take them.  Taking over-the-counter medicines, vitamins, herbs, and supplements.  General instructions  If told by your health care provider, do not put anything in the vagina before the procedure. Do not douche, have sex, use tampons, or use any vaginal medicines before the procedure.  Plan to have a responsible adult take you home from the hospital or clinic.  If you will  be going home right after the procedure, plan to have a responsible adult care for you for the time you are told. This is important.  You may be asked to empty your bladder right before the procedure.  What happens during the procedure?    An IV will be inserted into one of your veins.  You will be given one or more of the following:  A medicine to help you relax (sedative).  A medicine to make you fall asleep (general anesthetic).  A medicine that numbs the cervix (cervical block) if you will  be awake during the procedure.  A lubricated device called a speculum will be inserted into your vagina.  An instrument that has a magnifying lens and a light (colposcope) will be used to examine the cervix more closely.  A solution will be applied to your cervix. This turns abnormal areas a pale color.  A cone-shaped tissue sample will be removed from the cervix.  A paste will be applied over the biopsy area to help control bleeding.  The tissue sample will be checked under a microscope.  The procedure may vary among health care providers and hospitals.  What happens after the procedure?  Your blood pressure, heart rate, breathing rate, and blood oxygen level will be monitored until you leave the hospital or clinic.  You may have some abdominal cramping.  You may have bloody discharge or light to moderate bleeding.  You may have brown or black discharge coming from your vagina. This is from the paste used on the cervix to control bleeding.  Summary  Cervical conization is a procedure in which a cone-shaped portion of the cervix is removed so that it can be examined under a microscope.  This procedure is done to check for cancer cells or cells that might turn into cancer (precancerous cells).  After the procedure, you may have bloody discharge or light to moderate bleeding. You may also have brown or black discharge from the paste that was used on the cervix to prevent bleeding.  Plan to have a responsible adult take you home from the hospital or clinic.  This information is not intended to replace advice given to you by your health care provider. Make sure you discuss any questions you have with your health care provider.  Document Revised: 02/02/2021 Document Reviewed: 02/02/2021  Elsevier Patient Education  2024 [employer].

## 2023-11-22 ENCOUNTER — Telehealth: Payer: Self-pay | Admitting: Cardiology

## 2023-11-22 NOTE — Telephone Encounter (Signed)
 Pt c/o BP issue: STAT if pt c/o blurred vision, one-sided weakness or slurred speech.  STAT if BP is GREATER than 180/120 TODAY.  STAT if BP is LESS than 90/60 and SYMPTOMATIC TODAY  1. What is your BP concern? Too High  2. Have you taken any BP medication today?yes  3. What are your last 5 BP readings?150/84, 214/?, 179/80,159/84  4. Are you having any other symptoms (ex. Dizziness, headache, blurred vision, passed out)? no

## 2023-11-22 NOTE — Telephone Encounter (Signed)
 Returned call to pt. Informed pt of Dr. Ival Bible response. Pt voiced understanding.

## 2023-11-22 NOTE — Telephone Encounter (Signed)
 Spoke with pt who states that her BP has been elevated since her appt with her PCP in February. At that time she reports her BP was 150/74. Pt was seen by [facility] and noted to have an elevated pressure prior to her procedure. Dr. Diona Browner was notified and pt was told to F/U with him or PCP for medication management. Pt reports that BP on today was 176/90 and she feels fine. BP was taken only 30 minutes after taking her medicine. Encouraged pt to wait 2 hours after taking medicine to take her BP. Next available appt is May 12. Please advise.

## 2023-11-23 ENCOUNTER — Other Ambulatory Visit: Payer: Self-pay | Admitting: Cardiology

## 2023-11-26 DIAGNOSIS — E782 Mixed hyperlipidemia: Secondary | ICD-10-CM | POA: Diagnosis not present

## 2023-11-26 DIAGNOSIS — E1169 Type 2 diabetes mellitus with other specified complication: Secondary | ICD-10-CM | POA: Diagnosis not present

## 2023-11-26 DIAGNOSIS — I2510 Atherosclerotic heart disease of native coronary artery without angina pectoris: Secondary | ICD-10-CM | POA: Diagnosis not present

## 2023-11-26 DIAGNOSIS — Z6830 Body mass index (BMI) 30.0-30.9, adult: Secondary | ICD-10-CM | POA: Diagnosis not present

## 2023-11-26 DIAGNOSIS — I10 Essential (primary) hypertension: Secondary | ICD-10-CM | POA: Diagnosis not present

## 2023-11-29 ENCOUNTER — Encounter: Payer: Self-pay | Admitting: Obstetrics & Gynecology

## 2023-11-30 ENCOUNTER — Encounter: Payer: Self-pay | Admitting: Obstetrics & Gynecology

## 2023-12-04 ENCOUNTER — Telehealth: Payer: Self-pay | Admitting: Adult Health

## 2023-12-04 MED ORDER — BUSPIRONE HCL 5 MG PO TABS: 5.0000 mg | ORAL_TABLET | Freq: Three times a day (TID) | ORAL | 1 refills | Status: DC

## 2023-12-04 NOTE — Telephone Encounter (Signed)
+  anxiety, worried, BP elevated, will rx buspar 5 mg 1 tid and talk with Dr Despina Hidden Thursday. She has laser cone 12/26/23 with him

## 2023-12-04 NOTE — Telephone Encounter (Signed)
 Left message I called

## 2023-12-06 ENCOUNTER — Telehealth: Payer: Self-pay | Admitting: Adult Health

## 2023-12-06 NOTE — Telephone Encounter (Signed)
 Started buspar last night and slept good and BP a little better today. Dr Despina Hidden is aware that started Buspar

## 2023-12-19 ENCOUNTER — Other Ambulatory Visit: Admit: 2023-12-19 | Discharge: 2023-12-20

## 2023-12-19 DIAGNOSIS — I10 Essential (primary) hypertension: Secondary | ICD-10-CM | POA: Diagnosis not present

## 2023-12-19 DIAGNOSIS — J019 Acute sinusitis, unspecified: Secondary | ICD-10-CM | POA: Diagnosis not present

## 2023-12-19 DIAGNOSIS — Z6830 Body mass index (BMI) 30.0-30.9, adult: Secondary | ICD-10-CM | POA: Diagnosis not present

## 2023-12-19 DIAGNOSIS — I2510 Atherosclerotic heart disease of native coronary artery without angina pectoris: Secondary | ICD-10-CM | POA: Diagnosis not present

## 2023-12-19 DIAGNOSIS — Z951 Presence of aortocoronary bypass graft: Secondary | ICD-10-CM | POA: Diagnosis not present

## 2023-12-20 NOTE — Patient Instructions (Signed)
 Leslie Cobb  12/20/2023     @PREFPERIOPPHARMACY @   Your procedure is scheduled on 12/26/2023.   Report to [facility] at 1100 A.M.   Call this number if you have problems the morning of surgery:  [phone]  If you experience any cold or flu symptoms such as cough, fever, chills, shortness of breath, etc. between now and your scheduled surgery, please notify us at the above number.   Remember:        Your last dose of tradjenta should be on 12/22/2023.        DO NOT take any medications for diabetes the morning of your procedure.   Do not eat after midnight.    You may drink clear liquids until  0900 am on 12/26/2023.    Clear liquids allowed are:                    Water, Juice (No red color; non-citric and without pulp; diabetics please choose diet or no sugar options), Carbonated beverages (diabetics please choose diet or no sugar options), Clear Tea (No creamer, milk, or cream, including half & half and powdered creamer), Black Coffee Only (No creamer, milk or cream, including half & half and powdered creamer), and Clear Sports drink (No red color; diabetics please choose diet or no sugar options)           At 0900 am on 12/26/2023 drink your carb drink. You can have nothing else after this to drink.    Take these medicines the morning of surgery with A SIP OF WATER          amlodipine,buspirone, isosorbide, metoprolol, omeprazole.    Do not wear jewelry, make-up or nail polish, including gel polish,  artificial nails, or any other type of covering on natural nails (fingers and  toes).  Do not wear lotions, powders, or perfumes, or deodorant.  Do not shave 48 hours prior to surgery.  Men may shave face and neck.  Do not bring valuables to the hospital.  [facility] is not responsible for any belongings or valuables.  Contacts, dentures or bridgework may not be worn into surgery.  Leave your suitcase in the car.  After surgery it may be brought to your  room.  For patients admitted to the hospital, discharge time will be determined by your treatment team.  Patients discharged the day of surgery will not be allowed to drive home and must have someone with them for 24 hours.    Special instructions:  DO NOT smoke tobacco or vape for 24 hours before your procedure.  Please read over the following fact sheets that you were given. Coughing and Deep Breathing, Surgical Site Infection Prevention, Anesthesia Post-op Instructions, and Care and Recovery After Surgery        Cervical Conization, Care After What can I expect after the procedure? A groggy feeling, if you were given medicine to make you fall asleep. Cramps that feel like menstrual cramps. Bloody discharge or light to moderate bleeding. [place] to black fluid coming from the vagina (vaginal discharge). This fluid may look like coffee grounds. This is from the paste that was put on the cervix to control bleeding. Follow these instructions at home: Your doctor may give you more instructions. If you have problems, contact your doctor. Medicines Take over-the-counter and prescription medicines only as told by your doctor. Do not take aspirin or ibuprofen until your doctor says it is okay. Activity  Rest as told by your doctor. For 7-14 days after your procedure, avoid: Being very active. Exercising. Heavy lifting. Do not lift anything that is heavier than 10 lb (4.5 kg), or the limit that you are told, until your doctor says that it is safe. Return to your normal activities when your doctor says that it is safe. General instructions You may eat your normal diet unless your doctor tells you not to. Drink enough fluid to keep your pee (urine) pale yellow. Do not take baths, swim, or use a hot tub until your doctor says it is okay. You may take showers. Do not douche, have sex, or put anything in the vagina, including tampons, until your doctor says it is okay. Keep all follow-up  visits. Contact a doctor if: You faint or feel dizzy or light-headed. You feel like you may vomit (nauseous). You vomit. You have fluid from your vagina that smells bad. You have a fever. You have blood in your pee or pain when you pee. Get help right away if: There are bloody clumps (clots) coming from your vagina. You have more bleeding than you would have in a normal menstrual period. For example, you soak a pad in less than 1 hour. You have very bad pain, or your pain gets worse and medicine does not help. Summary After the procedure, it is common to have cramps, bleeding from the vagina, and dark or bloody fluid from the vagina. Do not douche, have sex, or use tampons until your doctor says it is okay. For about 7-14 days after your procedure, try not to exercise or lift heavy things. Contact a doctor if you faint or feel dizzy or light-headed. Keep all follow-up visits. This information is not intended to replace advice given to you by your health care provider. Make sure you discuss any questions you have with your health care provider. Document Revised: 02/02/2021 Document Reviewed: 02/02/2021 Elsevier Patient Education  2024 [employer]General Anesthesia, Adult, Care After The following information offers guidance on how to care for yourself after your procedure. Your health care provider may also give you more specific instructions. If you have problems or questions, contact your health care provider. What can I expect after the procedure? After the procedure, it is common for people to: Have pain or discomfort at the IV site. Have nausea or vomiting. Have a sore throat or hoarseness. Have trouble concentrating. Feel cold or chills. Feel weak, sleepy, or tired (fatigue). Have soreness and body aches. These can affect parts of the body that were not involved in surgery. Follow these instructions at home: For the time period you were told by your health care  provider:  Rest. Do not participate in activities where you could fall or become injured. Do not drive or use machinery. Do not drink alcohol. Do not take sleeping pills or medicines that cause drowsiness. Do not make important decisions or sign legal documents. Do not take care of children on your own. General instructions Drink enough fluid to keep your urine pale yellow. If you have sleep apnea, surgery and certain medicines can increase your risk for breathing problems. Follow instructions from your health care provider about wearing your sleep device: Anytime you are sleeping, including during daytime naps. While taking prescription pain medicines, sleeping medicines, or medicines that make you drowsy. Return to your normal activities as told by your health care provider. Ask your health care provider what activities are safe for you. Take over-the-counter and prescription medicines only  as told by your health care provider. Do not use any products that contain nicotine or tobacco. These products include cigarettes, chewing tobacco, and vaping devices, such as e-cigarettes. These can delay incision healing after surgery. If you need help quitting, ask your health care provider. Contact a health care provider if: You have nausea or vomiting that does not get better with medicine. You vomit every time you eat or drink. You have pain that does not get better with medicine. You cannot urinate or have bloody urine. You develop a skin rash. You have a fever. Get help right away if: You have trouble breathing. You have chest pain. You vomit blood. These symptoms may be an emergency. Get help right away. Call 911. Do not wait to see if the symptoms will go away. Do not drive yourself to the hospital. Summary After the procedure, it is common to have a sore throat, hoarseness, nausea, vomiting, or to feel weak, sleepy, or fatigue. For the time period you were told by your health care  provider, do not drive or use machinery. Get help right away if you have difficulty breathing, have chest pain, or vomit blood. These symptoms may be an emergency. This information is not intended to replace advice given to you by your health care provider. Make sure you discuss any questions you have with your health care provider. Document Revised: 11/25/2021 Document Reviewed: 11/25/2021 Elsevier Patient Education  2024 [employer].How to Use Chlorhexidine at Home in the Shower Chlorhexidine gluconate (CHG) is a germ-killing (antiseptic) wash that's used to clean the skin. It can get rid of the germs that normally live on the skin and can keep them away for about 24 hours. If you're having surgery, you may be told to shower with CHG at home the night before surgery. This can help lower your risk for infection. To use CHG wash in the shower, follow the steps below. Supplies needed: CHG body wash. Clean washcloth. Clean towel. How to use CHG in the shower Follow these steps unless you're told to use CHG in a different way: Start the shower. Use your normal soap and shampoo to wash your face and hair. Turn off the shower or move out of the shower stream. Pour CHG onto a clean washcloth. Do not use any type of brush or rough sponge. Start at your neck, washing your body down to your toes. Make sure you: Wash the part of your body where the surgery will be done for at least 1 minute. Do not scrub. Do not use CHG on your head or face unless your health care provider tells you to. If it gets into your ears or eyes, rinse them well with water. Do not wash your genitals with CHG. Wash your back and under your arms. Make sure to wash skin folds. Let the CHG sit on your skin for 1-2 minutes or as long as told. Rinse your entire body in the shower, including all body creases and folds. Turn off the shower. Dry off with a clean towel. Do not put anything on your skin afterward, such as powder,  lotion, or perfume. Put on clean clothes or pajamas. If it's the night before surgery, sleep in clean sheets. General tips Use CHG only as told, and follow the instructions on the label. Use the full amount of CHG as told. This is often one bottle. Do not smoke and stay away from flames after using CHG. Your skin may feel sticky after using CHG. This  is normal. The sticky feeling will go away as the CHG dries. Do not use CHG: If you have a chlorhexidine allergy or have reacted to chlorhexidine in the past. On open wounds or areas of skin that have broken skin, cuts, or scrapes. On babies younger than 45 months of age. Contact a health care provider if: You have questions about using CHG. Your skin gets irritated or itchy. You have a rash after using CHG. You swallow any CHG. Call your local poison control center [phone] in the [place] Your eyes itch badly, or they become very red or swollen. Your hearing changes. You have trouble seeing. If you can't reach your provider, go to an urgent care or emergency room. Do not drive yourself. Get help right away if: You have swelling or tingling in your mouth or throat. You make high-pitched whistling sounds when you breathe, most often when you breathe out (wheeze). You have trouble breathing. These symptoms may be an emergency. Call 911 right away. Do not wait to see if the symptoms will go away. Do not drive yourself to the hospital. This information is not intended to replace advice given to you by your health care provider. Make sure you discuss any questions you have with your health care provider. Document Revised: 03/13/2023 Document Reviewed: 03/09/2022 Elsevier Patient Education  2024 [employer].

## 2023-12-24 ENCOUNTER — Other Ambulatory Visit (HOSPITAL_COMMUNITY)

## 2023-12-24 ENCOUNTER — Other Ambulatory Visit: Payer: Self-pay | Admitting: Obstetrics & Gynecology

## 2023-12-24 ENCOUNTER — Encounter (HOSPITAL_COMMUNITY): Payer: Self-pay

## 2023-12-24 ENCOUNTER — Encounter (HOSPITAL_COMMUNITY)
Admission: RE | Admit: 2023-12-24 | Discharge: 2023-12-24 | Disposition: A | Discharge status: Home / Self Care | Source: Ambulatory Visit | Attending: Obstetrics & Gynecology | Admitting: Obstetrics & Gynecology

## 2023-12-24 ENCOUNTER — Other Ambulatory Visit: Payer: Self-pay

## 2023-12-24 DIAGNOSIS — Z01818 Encounter for other preprocedural examination: Secondary | ICD-10-CM

## 2023-12-24 DIAGNOSIS — Z01812 Encounter for preprocedural laboratory examination: Secondary | ICD-10-CM | POA: Diagnosis not present

## 2023-12-24 HISTORY — DX: Nausea with vomiting, unspecified: Z98.890

## 2023-12-24 LAB — COMPREHENSIVE METABOLIC PANEL WITH GFR
ALT: 26 U/L (ref 0–44)
AST: 27 U/L (ref 15–41)
Albumin: 4 g/dL (ref 3.5–5.0)
Alkaline Phosphatase: 108 U/L (ref 38–126)
Anion gap: 13 (ref 5–15)
BUN: 33 mg/dL — ABNORMAL HIGH (ref 8–23)
CO2: 19 mmol/L — ABNORMAL LOW (ref 22–32)
Calcium: 9.3 mg/dL (ref 8.9–10.3)
Chloride: 105 mmol/L (ref 98–111)
Creatinine, Ser: 1.02 mg/dL — ABNORMAL HIGH (ref 0.44–1.00)
GFR, Estimated: >60 mL/min (ref >60)
Glucose, Bld: 286 mg/dL — ABNORMAL HIGH (ref 70–99)
Potassium: 3.9 mmol/L (ref 3.5–5.1)
Sodium: 137 mmol/L (ref 135–145)
Total Bilirubin: 0.4 mg/dL (ref 0.0–1.2)
Total Protein: 7 g/dL (ref 6.5–8.1)

## 2023-12-24 LAB — URINALYSIS, ROUTINE W REFLEX MICROSCOPIC
Bacteria, UA: NONE SEEN
Bilirubin Urine: NEGATIVE
Glucose, UA: 50 mg/dL — AB
Hgb urine dipstick: NEGATIVE
Ketones, ur: NEGATIVE mg/dL
Leukocytes,Ua: NEGATIVE
Nitrite: NEGATIVE
Protein, ur: 30 mg/dL — AB
Specific Gravity, Urine: 1.018 (ref 1.005–1.030)
pH: 5 (ref 5.0–8.0)

## 2023-12-24 LAB — TYPE AND SCREEN
ABO/RH(D): O POS
Antibody Screen: NEGATIVE

## 2023-12-24 LAB — CBC
HCT: 35.7 % — ABNORMAL LOW (ref 36.0–46.0)
Hemoglobin: 11.7 g/dL — ABNORMAL LOW (ref 12.0–15.0)
MCH: 26.8 pg (ref 26.0–34.0)
MCHC: 32.8 g/dL (ref 30.0–36.0)
MCV: 81.7 fL (ref 80.0–100.0)
Platelets: 227 10*3/uL (ref 150–400)
RBC: 4.37 MIL/uL (ref 3.87–5.11)
RDW: 13.8 % (ref 11.5–15.5)
WBC: 7.4 10*3/uL (ref 4.0–10.5)
nRBC: 0 % (ref 0.0–0.2)

## 2023-12-24 LAB — RAPID HIV SCREEN (HIV 1/2 AB+AG)
HIV 1/2 Antibodies: NONREACTIVE
HIV-1 P24 Antigen - HIV24: NONREACTIVE

## 2023-12-26 ENCOUNTER — Encounter (HOSPITAL_COMMUNITY)
Admission: RE | Disposition: A | Discharge status: Home / Self Care | Payer: Self-pay | Source: Home / Self Care | Attending: Obstetrics & Gynecology

## 2023-12-26 ENCOUNTER — Ambulatory Visit (HOSPITAL_COMMUNITY)
Admission: RE | Admit: 2023-12-26 | Discharge: 2023-12-26 | Disposition: A | Discharge status: Home / Self Care | Attending: Obstetrics & Gynecology | Admitting: Obstetrics & Gynecology

## 2023-12-26 ENCOUNTER — Ambulatory Visit (HOSPITAL_COMMUNITY)

## 2023-12-26 ENCOUNTER — Encounter (HOSPITAL_COMMUNITY): Payer: Self-pay | Admitting: Obstetrics & Gynecology

## 2023-12-26 ENCOUNTER — Ambulatory Visit (HOSPITAL_BASED_OUTPATIENT_CLINIC_OR_DEPARTMENT_OTHER)

## 2023-12-26 DIAGNOSIS — I2510 Atherosclerotic heart disease of native coronary artery without angina pectoris: Secondary | ICD-10-CM | POA: Diagnosis not present

## 2023-12-26 DIAGNOSIS — E785 Hyperlipidemia, unspecified: Secondary | ICD-10-CM

## 2023-12-26 DIAGNOSIS — Z6832 Body mass index (BMI) 32.0-32.9, adult: Secondary | ICD-10-CM | POA: Insufficient documentation

## 2023-12-26 DIAGNOSIS — R87611 Atypical squamous cells cannot exclude high grade squamous intraepithelial lesion on cytologic smear of cervix (ASC-H): Secondary | ICD-10-CM

## 2023-12-26 DIAGNOSIS — I10 Essential (primary) hypertension: Secondary | ICD-10-CM | POA: Diagnosis not present

## 2023-12-26 DIAGNOSIS — I25119 Atherosclerotic heart disease of native coronary artery with unspecified angina pectoris: Secondary | ICD-10-CM

## 2023-12-26 DIAGNOSIS — D061 Carcinoma in situ of exocervix: Secondary | ICD-10-CM | POA: Insufficient documentation

## 2023-12-26 DIAGNOSIS — E119 Type 2 diabetes mellitus without complications: Secondary | ICD-10-CM | POA: Diagnosis not present

## 2023-12-26 DIAGNOSIS — Z7984 Long term (current) use of oral hypoglycemic drugs: Secondary | ICD-10-CM | POA: Diagnosis not present

## 2023-12-26 DIAGNOSIS — D060 Carcinoma in situ of endocervix: Secondary | ICD-10-CM | POA: Diagnosis not present

## 2023-12-26 DIAGNOSIS — R85610 Atypical squamous cells of undetermined significance on cytologic smear of anus (ASC-US): Secondary | ICD-10-CM | POA: Diagnosis not present

## 2023-12-26 DIAGNOSIS — N72 Inflammatory disease of cervix uteri: Secondary | ICD-10-CM | POA: Diagnosis not present

## 2023-12-26 DIAGNOSIS — E66813 Obesity, class 3: Secondary | ICD-10-CM | POA: Insufficient documentation

## 2023-12-26 DIAGNOSIS — D069 Carcinoma in situ of cervix, unspecified: Secondary | ICD-10-CM | POA: Diagnosis not present

## 2023-12-26 LAB — GLUCOSE, CAPILLARY
Glucose-Capillary: 221 mg/dL — ABNORMAL HIGH (ref 70–99)
Glucose-Capillary: 193 mg/dL — ABNORMAL HIGH (ref 70–99)

## 2023-12-26 SURGERY — CONE BIOPSY, CERVIX
Anesthesia: General | Site: Cervix

## 2023-12-26 MED ORDER — LIDOCAINE HCL (PF) 2 % IJ SOLN
INTRAMUSCULAR | Status: DC | PRN
  Administered 2023-12-26: 100 mg via INTRADERMAL

## 2023-12-26 MED ORDER — MONSELS FERRIC SUBSULFATE EX SOLN
CUTANEOUS | Status: AC
Start: 2023-12-26 — End: ?
  Filled 2023-12-26: qty 8

## 2023-12-26 MED ORDER — ONDANSETRON HCL 4 MG/2ML IJ SOLN
INTRAMUSCULAR | Status: AC
  Filled 2023-12-26: qty 2

## 2023-12-26 MED ORDER — MIDAZOLAM HCL 2 MG/2ML IJ SOLN
INTRAMUSCULAR | Status: AC
  Filled 2023-12-26: qty 2

## 2023-12-26 MED ORDER — LIDOCAINE HCL (PF) 2 % IJ SOLN
INTRAMUSCULAR | Status: AC
  Filled 2023-12-26: qty 5

## 2023-12-26 MED ORDER — WATER FOR IRRIGATION, STERILE IR SOLN
Status: DC | PRN
  Administered 2023-12-26: 1000 mL

## 2023-12-26 MED ORDER — FENTANYL CITRATE (PF) 100 MCG/2ML IJ SOLN
INTRAMUSCULAR | Status: AC
  Filled 2023-12-26: qty 2

## 2023-12-26 MED ORDER — KETOROLAC TROMETHAMINE 10 MG PO TABS: 10.0000 mg | ORAL_TABLET | Freq: Three times a day (TID) | ORAL | 0 refills | Status: DC | PRN

## 2023-12-26 MED ORDER — HYDROCODONE-ACETAMINOPHEN 5-325 MG PO TABS: 1.0000 | ORAL_TABLET | Freq: Four times a day (QID) | ORAL | 0 refills | Status: DC | PRN

## 2023-12-26 MED ORDER — ACETIC ACID 5 % SOLN
Status: DC | PRN
  Administered 2023-12-26: 1 via TOPICAL

## 2023-12-26 MED ORDER — KETOROLAC TROMETHAMINE 30 MG/ML IJ SOLN
30.0000 mg | INTRAMUSCULAR | Status: AC
  Administered 2023-12-26: 30 mg via INTRAVENOUS

## 2023-12-26 MED ORDER — KETOROLAC TROMETHAMINE 30 MG/ML IJ SOLN
INTRAMUSCULAR | Status: AC
  Filled 2023-12-26: qty 1

## 2023-12-26 MED ORDER — POVIDONE-IODINE 10 % EX SWAB
2.0000 | Freq: Once | CUTANEOUS | Status: AC
  Administered 2023-12-26: 2 via TOPICAL

## 2023-12-26 MED ORDER — MIDAZOLAM HCL 2 MG/2ML IJ SOLN
INTRAMUSCULAR | Status: DC | PRN
  Administered 2023-12-26: 2 mg via INTRAVENOUS

## 2023-12-26 MED ORDER — CEFAZOLIN SODIUM-DEXTROSE 2-4 GM/100ML-% IV SOLN
2.0000 g | INTRAVENOUS | Status: AC
  Administered 2023-12-26: 2 g via INTRAVENOUS

## 2023-12-26 MED ORDER — FENTANYL CITRATE (PF) 100 MCG/2ML IJ SOLN
INTRAMUSCULAR | Status: DC | PRN
  Administered 2023-12-26 (×2): 50 ug via INTRAVENOUS

## 2023-12-26 MED ORDER — PROPOFOL 500 MG/50ML IV EMUL
INTRAVENOUS | Status: DC | PRN
  Administered 2023-12-26: 150 ug/kg/min via INTRAVENOUS

## 2023-12-26 MED ORDER — PROPOFOL 10 MG/ML IV BOLUS
INTRAVENOUS | Status: DC | PRN
Start: 2023-12-26 — End: 2023-12-26
  Administered 2023-12-26: 200 mg via INTRAVENOUS

## 2023-12-26 MED ORDER — DEXAMETHASONE SODIUM PHOSPHATE 10 MG/ML IJ SOLN
INTRAMUSCULAR | Status: DC | PRN
Start: 2023-12-26 — End: 2023-12-26
  Administered 2023-12-26: 10 mg via INTRAVENOUS

## 2023-12-26 MED ORDER — CEFAZOLIN SODIUM-DEXTROSE 2-4 GM/100ML-% IV SOLN
INTRAVENOUS | Status: AC
  Filled 2023-12-26: qty 100

## 2023-12-26 MED ORDER — LACTATED RINGERS IV SOLN: INTRAVENOUS | Status: DC | PRN

## 2023-12-26 MED ORDER — MONSELS FERRIC SUBSULFATE EX SOLN
CUTANEOUS | Status: DC | PRN
Start: 2023-12-26 — End: 2023-12-26
  Administered 2023-12-26: 1 via TOPICAL

## 2023-12-26 MED ORDER — DEXAMETHASONE SODIUM PHOSPHATE 10 MG/ML IJ SOLN
INTRAMUSCULAR | Status: AC
  Filled 2023-12-26: qty 1

## 2023-12-26 MED ORDER — ONDANSETRON 8 MG PO TBDP: 8.0000 mg | ORAL_TABLET | Freq: Three times a day (TID) | ORAL | 0 refills | Status: DC | PRN

## 2023-12-26 MED ORDER — ONDANSETRON HCL 4 MG/2ML IJ SOLN
INTRAMUSCULAR | Status: DC | PRN
  Administered 2023-12-26: 4 mg via INTRAVENOUS

## 2023-12-26 SURGICAL SUPPLY — 24 items
APPLICATOR COTTON TIP 6 STRL (MISCELLANEOUS) ×1 IMPLANT
APPLICATOR COTTON TIP 6IN STRL (MISCELLANEOUS) ×1 IMPLANT
BAG HAMPER (MISCELLANEOUS) ×1 IMPLANT
COVER LIGHT HANDLE STERIS (MISCELLANEOUS) ×1 IMPLANT
ELECT REM PT RETURN 9FT ADLT (ELECTROSURGICAL) ×1 IMPLANT
ELECTRODE REM PT RTRN 9FT ADLT (ELECTROSURGICAL) ×1 IMPLANT
GLOVE BIOGEL PI IND STRL 7.0 (GLOVE) ×2 IMPLANT
GLOVE BIOGEL PI IND STRL 8 (GLOVE) ×1 IMPLANT
GLOVE ECLIPSE 8.0 STRL XLNG CF (GLOVE) ×2 IMPLANT
GOWN STRL REUS W/TWL LRG LVL3 (GOWN DISPOSABLE) ×1 IMPLANT
GOWN STRL REUS W/TWL XL LVL3 (GOWN DISPOSABLE) ×1 IMPLANT
KIT TURNOVER KIT A (KITS) ×1 IMPLANT
LASER FIBER DISP 1000U (UROLOGICAL SUPPLIES) ×1 IMPLANT
MANIFOLD NEPTUNE II (INSTRUMENTS) ×1 IMPLANT
PACK SRG BSC III STRL LF ECLPS (CUSTOM PROCEDURE TRAY) ×1 IMPLANT
PAD ARMBOARD POSITIONER FOAM (MISCELLANEOUS) ×1 IMPLANT
POSITIONER HEAD 8X9X4 ADT (SOFTGOODS) ×1 IMPLANT
PREFILTER SMOKE EVAC (FILTER) ×1 IMPLANT
SCOPETTES 8 STERILE (MISCELLANEOUS) ×1 IMPLANT
SET BASIN LINEN APH (SET/KITS/TRAYS/PACK) ×1 IMPLANT
SHEET LAVH (DRAPES) ×1 IMPLANT
TOWEL OR 17X26 4PK STRL BLUE (TOWEL DISPOSABLE) ×1 IMPLANT
TUBING SMOKE EVAC CO2 (TUBING) ×1 IMPLANT
WATER STERILE IRR 1000ML POUR (IV SOLUTION) ×1 IMPLANT

## 2023-12-26 NOTE — Anesthesia Postprocedure Evaluation (Signed)
 Anesthesia Post Note  Patient: Leslie Cobb  Procedure(s) Performed: CONE BIOPSY, CERVIX (Cervix)  Patient location during evaluation: PACU Anesthesia Type: General Level of consciousness: awake and alert Pain management: pain level controlled Vital Signs Assessment: post-procedure vital signs reviewed and stable Respiratory status: spontaneous breathing, nonlabored ventilation, respiratory function stable and patient connected to nasal cannula oxygen Cardiovascular status: blood pressure returned to baseline and stable Postop Assessment: no apparent nausea or vomiting Anesthetic complications: no  No notable events documented.   Last Vitals:  Vitals:   12/26/23 1415 12/26/23 1427  BP: 126/77 (!) 152/66  Pulse: 61 63  Resp: 13 19  Temp:  36.6 C  SpO2: 98% 98%    Last Pain:  Vitals:   12/26/23 1343  PainSc: Asleep                 Beacher Limerick

## 2023-12-26 NOTE — Anesthesia Preprocedure Evaluation (Signed)
 Anesthesia Evaluation  Patient identified by MRN, date of birth, ID band Patient awake    History of Anesthesia Complications (+) PONV and history of anesthetic complications  Airway Mallampati: II  TM Distance: >3 FB Neck ROM: Full    Dental no notable dental hx.    Pulmonary neg pulmonary ROS   Pulmonary exam normal breath sounds clear to auscultation       Cardiovascular hypertension, + angina  + CAD  II Rhythm:Regular Rate:Normal  cabg   Neuro/Psych negative neurological ROS  negative psych ROS   GI/Hepatic negative GI ROS, Neg liver ROS,,,  Endo/Other  diabetes, Oral Hypoglycemic Agents  Class 3 obesity  Renal/GU Renal disease  negative genitourinary   Musculoskeletal negative musculoskeletal ROS (+)    Abdominal  (+) + obese  Peds  Hematology negative hematology ROS (+)   Anesthesia Other Findings   Reproductive/Obstetrics                             Anesthesia Physical Anesthesia Plan  ASA: 3  Anesthesia Plan: General   Post-op Pain Management:    Induction:   PONV Risk Score and Plan: 2  Airway Management Planned: Nasal Cannula  Additional Equipment:   Intra-op Plan:   Post-operative Plan:   Informed Consent: I have reviewed the patients History and Physical, chart, labs and discussed the procedure including the risks, benefits and alternatives for the proposed anesthesia with the patient or authorized representative who has indicated his/her understanding and acceptance.       Plan Discussed with: CRNA  Anesthesia Plan Comments:        Anesthesia Quick Evaluation

## 2023-12-26 NOTE — Op Note (Signed)
 Preoperative diagnosis:  1. ASCUS cannot rule out HSIL                                         2.  Inadequate colposcopy  Postoperative Diagnosis:  Same as above  Procedure:  Cervical conization using laser,  ablation of cervical bed using laser  Surgeon:  Laron Plummer MD  Anesthesia:  Laryngeal mask airway  Findings:   Today at the time of surgery a repeat colposcopy was performed using 3% acetic acid and the lesion was once again confirmed.  There  were no new findings today.  Description of operation:  Patient was taken to the operating room and placed in the supine position where she underwent laryngeal mask airway anesthesia.  She was then placed in the high lithotomy position using candy cane stirrups.  She was then draped out for a laser procedure.  The microscope was used and 3% acetic acid was placed on the cervix.  The holmium laser was then employed at a power of 2.5 and rates between 8 and 15.  I achieved a couple millimeter margin around lesions both at 12:00 and 6:00 the laser was used to perform a conization.  The specimen was removed and sent to pathology for evaluation.  As is always the case with laser I did achieve at an appropriate margin around the disease with shrinkage of the tissue during the procedure it may appear to be a positive lateral margin.  However the surgical margin is indeed clear.  I then used the laser to ablate the conization bed to a depth of 5-7 mm laterally coning down to 9 mm centrally and began getting good surgical margin.  Additional hemostasis was achieved using Monsel solution.  In the conization bed was completely hemostatic.  Blood loss for the procedure was none.  The patient received ancef 2 grams and toradol 30 mg IV preoperatively.  The patient was awakened from anesthesia and taken to the recovery room in good stable condition with all counts being correct.  She will be followed up in the office in one month for evaluation of the conization  bed.  Wendelyn Halter, MD 12/26/2023 1:43 PM

## 2023-12-26 NOTE — Transfer of Care (Signed)
 Immediate Anesthesia Transfer of Care Note  Patient: Leslie Cobb  Procedure(s) Performed: CONE BIOPSY, CERVIX (Cervix)  Patient Location: PACU  Anesthesia Type:General  Level of Consciousness: awake, alert , oriented, and patient cooperative  Airway & Oxygen Therapy: Patient Spontanous Breathing  Post-op Assessment: Report given to RN, Post -op Vital signs reviewed and stable, and Patient moving all extremities X 4  Post vital signs: Reviewed and stable  Last Vitals:  Vitals Value Taken Time  BP 112/60 12/26/23 1345  Temp 36.1 C 12/26/23 1343  Pulse 62 12/26/23 1358  Resp 18 12/26/23 1358  SpO2 98 % 12/26/23 1358  Vitals shown include unfiled device data.  Last Pain:  Vitals:   12/26/23 1343  PainSc: Asleep         Complications: No notable events documented.

## 2023-12-26 NOTE — Anesthesia Procedure Notes (Signed)
 Procedure Name: LMA Insertion Date/Time: 12/26/2023 12:59 PM  Performed by: Leeanne Puffer, CRNAPre-anesthesia Checklist: Patient identified, Emergency Drugs available, Suction available, Patient being monitored and Timeout performed Patient Re-evaluated:Patient Re-evaluated prior to induction Oxygen Delivery Method: Circle system utilized Preoxygenation: Pre-oxygenation with 100% oxygen Induction Type: IV induction LMA: LMA inserted LMA Size: 4.0 Number of attempts: 1 Placement Confirmation: positive ETCO2, CO2 detector and breath sounds checked- equal and bilateral Tube secured with: Tape Dental Injury: Teeth and Oropharynx as per pre-operative assessment

## 2023-12-26 NOTE — H&P (Signed)
 Preoperative History and Physical  Leslie Cobb is a 68 y.o. G1P1001 with No LMP recorded. Patient is postmenopausal. admitted for a laser conization of the cervix for ASCUS cannot rule out HSIL with inadequate colposcopy.  Needs laser conization for diagnostic and therapeutic purposes  PMH:    Past Medical History:  Diagnosis Date   CAD (coronary artery disease)    a. 02/2018: cath due to unstable angina --> showed multivessel CAD with CABG recommended. b. 02/20/2018: s/p CABG with LIMA-LAD, Seq SVG-Ramus 1 and 2, and SVG-PLB    Hyperlipidemia    Hypertension    Kidney stones    Low back pain 07/2013   Obesity    PONV (postoperative nausea and vomiting)    Type 2 diabetes mellitus (HCC)    Vaginal Pap smear, abnormal     PSH:     Past Surgical History:  Procedure Laterality Date   BREAST SURGERY Left    CATARACT EXTRACTION     CORONARY ARTERY BYPASS GRAFT N/A 02/20/2018   Procedure: CORONARY ARTERY BYPASS GRAFTING (CABG) x 4 (LIMA to LAD, SVG SEQUENTIALLY to RAMUS 1 and 2, SVG to PLB) with EVH from RIGHT GREATER SAPHENOUS VEIN and LEFT INTERNAL MAMMARY ARTERY HARVEST;  Surgeon: Loreli Slot, MD;  Location: [facility] OR;  Service: Open Heart Surgery;  Laterality: N/A;   GALLBLADDER SURGERY     LEFT HEART CATH AND CORONARY ANGIOGRAPHY N/A 02/18/2018   Procedure: LEFT HEART CATH AND CORONARY ANGIOGRAPHY;  Surgeon: Marykay Lex, MD;  Location: [facility] INVASIVE CV LAB;  Service: Cardiovascular;  Laterality: N/A;   LEFT HEART CATH AND CORS/GRAFTS ANGIOGRAPHY N/A 07/12/2022   Procedure: LEFT HEART CATH AND CORS/GRAFTS ANGIOGRAPHY;  Surgeon: Marykay Lex, MD;  Location: [facility] INVASIVE CV LAB;  Service: Cardiovascular;  Laterality: N/A;   TEE WITHOUT CARDIOVERSION N/A 02/20/2018   Procedure: TRANSESOPHAGEAL ECHOCARDIOGRAM (TEE);  Surgeon: Loreli Slot, MD;  Location: [facility] OR;  Service: Open Heart Surgery;  Laterality: N/A;    POb/GynH:      OB History     Gravida  1    Para  1   Term  1   Preterm      AB      Living  1      SAB      IAB      Ectopic      Multiple      Live Births  1           SH:   Social History   Tobacco Use   Smoking status: Never    Passive exposure: Never   Smokeless tobacco: Never  Vaping Use   Vaping status: Never Used  Substance Use Topics   Alcohol use: Not Currently    Comment: wine at night   Drug use: No    FH:    Family History  Problem Relation Age of Onset   Diabetes Maternal Grandmother    Heart disease Father    Lung cancer Father    Diabetes Mother    Hypertension Mother    Heart disease Mother    Arthritis Mother    Kidney disease Mother    Hypertension Brother    Heart disease Brother    Heart attack Brother    Heart disease Brother    Heart disease Brother    Heart attack Brother    Diabetes Maternal Aunt      Allergies: No Known Allergies  Medications:  Current Facility-Administered Medications:    ceFAZolin (ANCEF) 2-4 GM/100ML-% IVPB, , , ,    ceFAZolin (ANCEF) IVPB 2g/100 mL premix, 2 g, Intravenous, On Call to OR, Wendelyn Halter, MD   ketorolac (TORADOL) 30 MG/ML injection, , , ,   Review of Systems:   Review of Systems  Constitutional: Negative for fever, chills, weight loss, malaise/fatigue and diaphoresis.  HENT: Negative for hearing loss, ear pain, nosebleeds, congestion, sore throat, neck pain, tinnitus and ear discharge.   Eyes: Negative for blurred vision, double vision, photophobia, pain, discharge and redness.  Respiratory: Negative for cough, hemoptysis, sputum production, shortness of breath, wheezing and stridor.   Cardiovascular: Negative for chest pain, palpitations, orthopnea, claudication, leg swelling and PND.  Gastrointestinal: Positive for abdominal pain. Negative for heartburn, nausea, vomiting, diarrhea, constipation, blood in stool and melena.  Genitourinary: Negative for dysuria, urgency, frequency, hematuria and flank pain.   Musculoskeletal: Negative for myalgias, back pain, joint pain and falls.  Skin: Negative for itching and rash.  Neurological: Negative for dizziness, tingling, tremors, sensory change, speech change, focal weakness, seizures, loss of consciousness, weakness and headaches.  Endo/Heme/Allergies: Negative for environmental allergies and polydipsia. Does not bruise/bleed easily.  Psychiatric/Behavioral: Negative for depression, suicidal ideas, hallucinations, memory loss and substance abuse. The patient is not nervous/anxious and does not have insomnia.      PHYSICAL EXAM:  Blood pressure (!) 166/79, pulse 71, temperature 97.9 F (36.6 C), resp. rate 18, SpO2 98%.    Vitals reviewed. Constitutional: She is oriented to person, place, and time. She appears well-developed and well-nourished.  HENT:  Head: Normocephalic and atraumatic.  Right Ear: External ear normal.  Left Ear: External ear normal.  Nose: Nose normal.  Mouth/Throat: Oropharynx is clear and moist.  Eyes: Conjunctivae and EOM are normal. Pupils are equal, round, and reactive to light. Right eye exhibits no discharge. Left eye exhibits no discharge. No scleral icterus.  Neck: Normal range of motion. Neck supple. No tracheal deviation present. No thyromegaly present.  Cardiovascular: Normal rate, regular rhythm, normal heart sounds and intact distal pulses.  Exam reveals no gallop and no friction rub.   No murmur heard. Respiratory: Effort normal and breath sounds normal. No respiratory distress. She has no wheezes. She has no rales. She exhibits no tenderness.  GI: Soft. Bowel sounds are normal. She exhibits no distension and no mass. There is tenderness. There is no rebound and no guarding.  Genitourinary:       Vulva is normal without lesions Vagina is pink moist without discharge Cervix normal in appearance and pap is normal Uterus is normal size, contour, position, consistency, mobility, non-tender Adnexa is negative  with normal sized ovaries by sonogram  Musculoskeletal: Normal range of motion. She exhibits no edema and no tenderness.  Neurological: She is alert and oriented to person, place, and time. She has normal reflexes. She displays normal reflexes. No cranial nerve deficit. She exhibits normal muscle tone. Coordination normal.  Skin: Skin is warm and dry. No rash noted. No erythema. No pallor.  Psychiatric: She has a normal mood and affect. Her behavior is normal. Judgment and thought content normal.    Labs: Results for orders placed or performed during the hospital encounter of 12/26/23 (from the past 2 weeks)  Glucose, capillary   Collection Time: 12/26/23 11:19 AM  Result Value Ref Range   Glucose-Capillary 221 (H) 70 - 99 mg/dL  Results for orders placed or performed during the hospital encounter of 12/24/23 (from the past  2 weeks)  CBC   Collection Time: 12/24/23 10:35 AM  Result Value Ref Range   WBC 7.4 4.0 - 10.5 K/uL   RBC 4.37 3.87 - 5.11 MIL/uL   Hemoglobin 11.7 (L) 12.0 - 15.0 g/dL   HCT 06.3 (L) 01.6 - 01.0 %   MCV 81.7 80.0 - 100.0 fL   MCH 26.8 26.0 - 34.0 pg   MCHC 32.8 30.0 - 36.0 g/dL   RDW 93.2 35.5 - 73.2 %   Platelets 227 150 - 400 K/uL   nRBC 0.0 0.0 - 0.2 %  Comprehensive metabolic panel   Collection Time: 12/24/23 10:35 AM  Result Value Ref Range   Sodium 137 135 - 145 mmol/L   Potassium 3.9 3.5 - 5.1 mmol/L   Chloride 105 98 - 111 mmol/L   CO2 19 (L) 22 - 32 mmol/L   Glucose, Bld 286 (H) 70 - 99 mg/dL   BUN 33 (H) 8 - 23 mg/dL   Creatinine, Ser 2.02 (H) 0.44 - 1.00 mg/dL   Calcium 9.3 8.9 - 54.2 mg/dL   Total Protein 7.0 6.5 - 8.1 g/dL   Albumin 4.0 3.5 - 5.0 g/dL   AST 27 15 - 41 U/L   ALT 26 0 - 44 U/L   Alkaline Phosphatase 108 38 - 126 U/L   Total Bilirubin 0.4 0.0 - 1.2 mg/dL   GFR, Estimated >70 >62 mL/min   Anion gap 13 5 - 15  Rapid HIV screen (HIV 1/2 Ab+Ag)   Collection Time: 12/24/23 10:35 AM  Result Value Ref Range   HIV-1 P24 Antigen  - HIV24 NON REACTIVE NON REACTIVE   HIV 1/2 Antibodies NON REACTIVE NON REACTIVE   Interpretation (HIV Ag Ab)      A non reactive test result means that HIV 1 or HIV 2 antibodies and HIV 1 p24 antigen were not detected in the specimen.  Urinalysis, Routine w reflex microscopic -Urine, Clean Catch   Collection Time: 12/24/23 10:35 AM  Result Value Ref Range   Color, Urine YELLOW YELLOW   APPearance CLEAR CLEAR   Specific Gravity, Urine 1.018 1.005 - 1.030   pH 5.0 5.0 - 8.0   Glucose, UA 50 (A) NEGATIVE mg/dL   Hgb urine dipstick NEGATIVE NEGATIVE   Bilirubin Urine NEGATIVE NEGATIVE   Ketones, ur NEGATIVE NEGATIVE mg/dL   Protein, ur 30 (A) NEGATIVE mg/dL   Nitrite NEGATIVE NEGATIVE   Leukocytes,Ua NEGATIVE NEGATIVE   RBC / HPF 0-5 0 - 5 RBC/hpf   WBC, UA 0-5 0 - 5 WBC/hpf   Bacteria, UA NONE SEEN NONE SEEN   Squamous Epithelial / HPF 0-5 0 - 5 /HPF   Mucus PRESENT   Type and screen   Collection Time: 12/24/23 10:52 AM  Result Value Ref Range   ABO/RH(D) O POS    Antibody Screen NEG    Sample Expiration 01/07/2024,2359    Extend sample reason      NO TRANSFUSIONS OR PREGNANCY IN THE PAST 3 MONTHS Performed at [facility], [address] [place], [place] [address]     EKG: Orders placed or performed in visit on 06/20/23   EKG 12-Lead    Imaging Studies: No results found.    Assessment: ASCUS cannot rule out HSIL with inadequate colposcopy  Plan: Laser conization of the cervix  Lazaro Arms 12/26/2023 12:40 PM

## 2023-12-27 ENCOUNTER — Encounter: Payer: Self-pay | Admitting: Obstetrics & Gynecology

## 2023-12-27 ENCOUNTER — Encounter (HOSPITAL_COMMUNITY): Payer: Self-pay | Admitting: Obstetrics & Gynecology

## 2023-12-27 LAB — SURGICAL PATHOLOGY

## 2024-01-07 ENCOUNTER — Encounter: Admitting: Obstetrics & Gynecology

## 2024-01-07 ENCOUNTER — Ambulatory Visit: Admitting: Obstetrics & Gynecology

## 2024-01-07 ENCOUNTER — Telehealth: Payer: Self-pay | Admitting: Obstetrics & Gynecology

## 2024-01-07 ENCOUNTER — Encounter: Payer: Self-pay | Admitting: Obstetrics & Gynecology

## 2024-01-07 VITALS — BP 146/80 | HR 70 | Ht 64.0 in | Wt 185.0 lb

## 2024-01-07 DIAGNOSIS — Z9889 Other specified postprocedural states: Secondary | ICD-10-CM

## 2024-01-07 NOTE — Telephone Encounter (Signed)
 Patient calling was just seen this morning for post op. She states that she forgot to ask if she can go back to normal things as far as picking things up and what not. Asking if you or the nurse would call her. [phone]

## 2024-01-07 NOTE — Progress Notes (Signed)
 HPI: Patient returns for routine postoperative follow-up having undergone Laser conization of the cervix  on 12/26/23.  The patient's immediate postoperative recovery has been unremarkable. Since hospital discharge the patient reports no rpoblems, black discharge.   Current Outpatient Medications: acetaminophen  (TYLENOL ) 500 MG tablet, Take 2 tablets (1,000 mg total) by mouth every 8 (eight) hours as needed for mild pain., Disp: 30 tablet, Rfl: 0 amLODipine (NORVASC) 10 MG tablet, Take 10 mg by mouth daily., Disp: , Rfl:   aspirin  81 MG tablet, Take 1 tablet (81 mg total) by mouth daily., Disp: , Rfl:  atorvastatin  (LIPITOR ) 80 MG tablet, Take 1 tablet (80 mg total) by mouth daily at 6 PM., Disp: 30 tablet, Rfl: 1 busPIRone  (BUSPAR ) 5 MG tablet, Take 1 tablet (5 mg total) by mouth 3 (three) times daily., Disp: 90 tablet, Rfl: 1 Cholecalciferol  (VITAMIN D -3) 1000 units CAPS, Take 1,000 Units by mouth daily. , Disp: , Rfl:  Cyanocobalamin  (VITAMIN B 12 PO), Take 1,000 mcg by mouth 2 (two) times a week. Saturday and Sunday, Disp: , Rfl:  fluticasone (FLONASE) 50 MCG/ACT nasal spray, Place 1 spray into both nostrils daily as needed for allergies., Disp: , Rfl:  GLIPIZIDE  XL 10 MG 24 hr tablet, Take 10 mg by mouth daily with breakfast., Disp: , Rfl: 3 hydrochlorothiazide  (HYDRODIURIL ) 25 MG tablet, Take 25 mg by mouth daily., Disp: , Rfl:  HYDROcodone -acetaminophen  (NORCO/VICODIN) 5-325 MG tablet, Take 1 tablet by mouth every 6 (six) hours as needed., Disp: 10 tablet, Rfl: 0 isosorbide  mononitrate (IMDUR ) 30 MG 24 hr tablet, Take 1 tablet by mouth in the evening, Disp: 90 tablet, Rfl: 3 ketorolac  (TORADOL ) 10 MG tablet, Take 1 tablet (10 mg total) by mouth every 8 (eight) hours as needed., Disp: 15 tablet, Rfl: 0 loratadine (CLARITIN) 10 MG tablet, Take 10 mg by mouth daily as needed for allergies., Disp: , Rfl:  losartan  (COZAAR ) 100 MG tablet, Take 1 tablet by mouth once daily (Patient taking  differently: Take 200 mg by mouth at bedtime.), Disp: 90 tablet, Rfl: 1 Melatonin 10 MG TABS, Take 10 mg by mouth at bedtime as needed (sleep)., Disp: , Rfl:  metFORMIN  (GLUCOPHAGE -XR) 500 MG 24 hr tablet, Take 500 mg by mouth 2 (two) times daily., Disp: , Rfl:  metoprolol  tartrate (LOPRESSOR ) 50 MG tablet, Take 1 tablet by mouth twice daily, Disp: 180 tablet, Rfl: 3 Multiple Vitamin (MULTIVITAMIN) tablet, Take 1 tablet by mouth daily., Disp: , Rfl:  Omega-3 Fatty Acids (OMEGA 3 PO), Take 1 capsule by mouth every other day., Disp: , Rfl:  omeprazole  (PRILOSEC) 20 MG capsule, Take 1 capsule by mouth once daily, Disp: 90 capsule, Rfl: 1 ondansetron  (ZOFRAN -ODT) 8 MG disintegrating tablet, Take 1 tablet (8 mg total) by mouth every 8 (eight) hours as needed for nausea or vomiting., Disp: 8 tablet, Rfl: 0 TRADJENTA 5 MG TABS tablet, Take 5 mg by mouth daily., Disp: , Rfl:   No current facility-administered medications for this visit.    Blood pressure (!) 146/80, pulse 70, height 5\' 4"  (1.626 m), weight 185 lb (83.9 kg).  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 Post-operative state: S/P Laser conization of the cervix for HSIL + inadequate colposcopy>negative margins  Z98.890           Medications Prescribed this encounter: No orders of the defined types were placed in this encounter.  Follow up: Return in about 6 months (around 07/08/2024) for 1st follow up cervical cystology.    Wendelyn Halter, MD Attending Physician for the Center for [facility] and [facility] 01/07/2024 10:23 AM

## 2024-01-21 ENCOUNTER — Encounter: Payer: Self-pay | Admitting: Medical

## 2024-01-21 ENCOUNTER — Ambulatory Visit: Attending: Medical | Admitting: Medical

## 2024-01-21 VITALS — BP 138/80 | HR 72 | Ht 64.0 in | Wt 188.2 lb

## 2024-01-21 DIAGNOSIS — I2510 Atherosclerotic heart disease of native coronary artery without angina pectoris: Secondary | ICD-10-CM

## 2024-01-21 DIAGNOSIS — R011 Cardiac murmur, unspecified: Secondary | ICD-10-CM | POA: Diagnosis not present

## 2024-01-21 DIAGNOSIS — E782 Mixed hyperlipidemia: Secondary | ICD-10-CM

## 2024-01-21 DIAGNOSIS — I10 Essential (primary) hypertension: Secondary | ICD-10-CM

## 2024-01-21 NOTE — Patient Instructions (Signed)
 Medication Instructions:  Your physician recommends that you continue on your current medications as directed. Please refer to the Current Medication list given to you today.  *If you need a refill on your cardiac medications before your next appointment, please call your pharmacy*  Lab Work: NONE   If you have labs (blood work) drawn today and your tests are completely normal, you will receive your results only by: MyChart Message (if you have MyChart) OR A paper copy in the mail If you have any lab test that is abnormal or we need to change your treatment, we will call you to review the results.  Testing/Procedures: Your physician has requested that you have an echocardiogram. Echocardiography is a painless test that uses sound waves to create images of your heart. It provides your doctor with information about the size and shape of your heart and how well your heart's chambers and valves are working. This procedure takes approximately one hour. There are no restrictions for this procedure. Please do NOT wear cologne, perfume, aftershave, or lotions (deodorant is allowed). Please arrive 15 minutes prior to your appointment time.  Please note: We ask at that you not bring children with you during ultrasound (echo/ vascular) testing. Due to room size and safety concerns, children are not allowed in the ultrasound rooms during exams. Our front office staff cannot provide observation of children in our lobby area while testing is being conducted. An adult accompanying a patient to their appointment will only be allowed in the ultrasound room at the discretion of the ultrasound technician under special circumstances. We apologize for any inconvenience.   Follow-Up: At [facility], you and your health needs are our priority.  As part of our continuing mission to provide you with exceptional heart care, our providers are all part of one team.  This team includes your primary Cardiologist  (physician) and Advanced Practice Providers or APPs (Physician Assistants and Nurse Practitioners) who all work together to provide you with the care you need, when you need it.  Your next appointment:   6 month(s)  Provider:   You may see Teddie Favre, MD or one of the following Advanced Practice Providers on your designated Care Team:   Woodfin Hays, PA-C  [place], [place] Theotis Flake, [place]     We recommend signing up for the patient portal called "MyChart".  Sign up information is provided on this After Visit Summary.  MyChart is used to connect with patients for Virtual Visits (Telemedicine).  Patients are able to view lab/test results, encounter notes, upcoming appointments, etc.  Non-urgent messages can be sent to your provider as well.   To learn more about what you can do with MyChart, go to [email or web address].   Other Instructions Thank you for choosing [facility]!

## 2024-01-21 NOTE — Progress Notes (Signed)
 Cardiology Office Note:  .   Date:  01/21/2024  ID:  Leslie Cobb, DOB 1955-11-22, MRN [id number] PCP: Alston Jerry, MD  [facility] Providers Cardiologist:  Teddie Favre, MD     History of Present Illness: .   Leslie Cobb is a 68 y.o. female with a h/o CAD s/p CABG, hyperlipidemia, hypertension, diabetes type 2, obesity who presents for follow-up.   Most recent heart catheterization in November 2023 showed stable, severe native vessel disease with 4 out of 4 patent grafts, no obvious culprit lesion to explain symptoms, normal LVEF and EDP.  The patient was last seen in October 2024 and was overall stable from a cardiac perspective.  Today, the patient reports she was having elevatd Bp in the setting of stress due to health issues. She had cervix issues and tests came out benign. Now BP is much better. She has been taking all her cardiac meds. Patient eats a low salt diet. No chest pain, SOB, Lower leg edema, heart racing, lightheadedness, dizziness. She does no formal activity. She takes care of 25 year old grandson. PCP checks labs.   Studies Reviewed: Aaron Aas        LHC 07/2022   -------------------NATIVE CORONARIES-------------------   Ost LAD to Prox LAD lesion is 95% stenosed.  Prox LAD lesion is 90% stenosed.   Prox Cx lesion is 55% stenosed.  Mid Cx lesion is 60% stenosed.   Ost Ramus to Ramus-1 lesion is 99% stenosed.  Lat Ramus-1 lesion is 100% stenosed.   Ramus-2 lesion is 100% stenosed.   Dist RCA lesion is 40% stenosed.  1st RPL lesion is 100% stenosed.   -------------------GRAFTS-------------------   LIMA-LAD graft was visualized by angiography and is normal in caliber.  The graft exhibits no disease. There is competitive flow.   Seq SVG- RI1-RI2 graft was visualized by angiography and is moderate in size.  The graft exhibits no disease. The flow is not reversed.  There is no competitive flow   SVG graft was visualized by angiography and is  small. The graft exhibits mild diffuse disease. Origin to Prox Graft lesion is 40% stenosed.   ----------------------------------------------------------------------   The left ventricular systolic function is normal. The left ventricular ejection fraction is 55-65% by visual estimate.   LV end diastolic pressure is normal.   There is no aortic valve stenosis.   POSTOP DIAGNOSES Stable severe native vessel disease with 4 of 4 patent grafts: 95% proximal LAD at SP1 (competitive flow from LIMA graft),  Now essentially CTO of both branches of the RI with patent sequential SVG to RI 1 and RI2,  Moderate distal RCA disease with patent PDA and RPL 2, progression to occluded RPL 1 with widely patent SVG to RPL 1. No obvious culprit lesion to explain the patient's symptoms.  To treat the LAD would mean jeopardizing the LIMA graft, with the only benefit of potentially helping improve flow to SP1. Normal LVEF and EDP.     RECOMMENDATIONS Consider potentially nonanginal or microvascular cause for chest pain Continue to titrate GDMT for CAD       Randene Bustard, MD     Recommendations  Antiplatelet/Anticoag Recommend Aspirin  81mg  daily for moderate CAD.  Discharge Date In the absence of any other complications or medical issues, we expect the patient to be ready for discharge from a cath perspective on 07/12/2022.    Echo TEE 02/2018  Right ventricle: Normal cavity size, wall thickness and ejection fraction.  Tricuspid valve: Trace  regurgitation.  Septum: No Patent Foramen Ovale present.  Left atrium: Patent foramen ovale not present.         Physical Exam:   VS:  BP 138/80 (BP Location: Left Arm, Cuff Size: Normal)   Pulse 72   Ht 5\' 4"  (1.626 m)   Wt 188 lb 3.2 oz (85.4 kg)   SpO2 96%   BMI 32.30 kg/m    Wt Readings from Last 3 Encounters:  01/21/24 188 lb 3.2 oz (85.4 kg)  01/07/24 185 lb (83.9 kg)  12/24/23 187 lb (84.8 kg)    GEN: Well nourished, well developed in no  acute distress NECK: No JVD; No carotid bruits CARDIAC: RRR, + murmurs, rubs, gallops RESPIRATORY:  Clear to auscultation without rales, wheezing or rhonchi  ABDOMEN: Soft, non-tender, non-distended EXTREMITIES:  No edema; No deformity   ASSESSMENT AND PLAN: .    CAD status post CABG in 2019 including LIMA to LAD, SVG to OM1 and OM 2, SVG to PLB Repeat cath in November 2023 revealed patent bypass grafts, medical management was recommended.  EF was normal on angiography.  Patient denies any anginal symptoms.  She does no formal exercise but stays busy taking care of her grandson.  No further ischemic workup at this time.  Continue aspirin , Lipitor , Imdur , Cozaar , and Lopressor   Hypertension Reports BP was high in the setting of stress over health (cervix issues), but this has resolved.  Blood pressure today is 138/80.  Continue amlodipine 10 mg daily, hydrochlorothiazide  25 mg daily, Imdur  30 mg daily, Losartan  100 mg daily, Lopressor  50 mg twice daily.  Patient needs low-salt diet.  Can increase Imdur  if further blood pressure control is needed.  HLD PCP monitoring update lab work.  Last LDL at goal.  Continue Lipitor  80mg  daily.    Murmur I will order an echo.    Dispo: Follow-up in 6 months  Signed, Keyonda Bickle Rebekah Canada, PA-C

## 2024-01-22 DIAGNOSIS — H02054 Trichiasis without entropian left upper eyelid: Secondary | ICD-10-CM | POA: Diagnosis not present

## 2024-01-22 DIAGNOSIS — H02882 Meibomian gland dysfunction right lower eyelid: Secondary | ICD-10-CM | POA: Diagnosis not present

## 2024-01-22 DIAGNOSIS — H02885 Meibomian gland dysfunction left lower eyelid: Secondary | ICD-10-CM | POA: Diagnosis not present

## 2024-01-22 DIAGNOSIS — H43812 Vitreous degeneration, left eye: Secondary | ICD-10-CM | POA: Diagnosis not present

## 2024-01-22 DIAGNOSIS — H1045 Other chronic allergic conjunctivitis: Secondary | ICD-10-CM | POA: Diagnosis not present

## 2024-02-21 ENCOUNTER — Ambulatory Visit (HOSPITAL_COMMUNITY)
Admission: RE | Admit: 2024-02-21 | Discharge: 2024-02-21 | Disposition: A | Discharge status: Home / Self Care | Source: Ambulatory Visit | Attending: Medical | Admitting: Medical

## 2024-02-21 DIAGNOSIS — R011 Cardiac murmur, unspecified: Secondary | ICD-10-CM | POA: Insufficient documentation

## 2024-02-21 LAB — ECHOCARDIOGRAM COMPLETE
AR max vel: 1.85 cm2
AV Area VTI: 1.89 cm2
AV Area mean vel: 1.77 cm2
AV Mean grad: 7.7 mmHg
AV Peak grad: 13.7 mmHg
Ao pk vel: 1.85 m/s
Area-P 1/2: 3.6 cm2
S' Lateral: 3.2 cm

## 2024-02-21 NOTE — Progress Notes (Signed)
*  PRELIMINARY RESULTS* Echocardiogram 2D Echocardiogram has been performed.  Leslie Cobb 02/21/2024, 3:50 PM

## 2024-02-27 ENCOUNTER — Ambulatory Visit: Payer: Self-pay | Admitting: Medical

## 2024-02-28 ENCOUNTER — Other Ambulatory Visit: Payer: Self-pay | Admitting: Adult Health

## 2024-03-18 DIAGNOSIS — M25511 Pain in right shoulder: Secondary | ICD-10-CM | POA: Diagnosis not present

## 2024-03-18 DIAGNOSIS — Z6829 Body mass index (BMI) 29.0-29.9, adult: Secondary | ICD-10-CM | POA: Diagnosis not present

## 2024-03-28 ENCOUNTER — Other Ambulatory Visit: Payer: Self-pay | Admitting: Adult Health

## 2024-04-17 ENCOUNTER — Other Ambulatory Visit: Payer: Self-pay | Admitting: Nurse Practitioner

## 2024-04-22 DIAGNOSIS — E7849 Other hyperlipidemia: Secondary | ICD-10-CM | POA: Diagnosis not present

## 2024-04-22 DIAGNOSIS — E1169 Type 2 diabetes mellitus with other specified complication: Secondary | ICD-10-CM | POA: Diagnosis not present

## 2024-04-22 DIAGNOSIS — Z0001 Encounter for general adult medical examination with abnormal findings: Secondary | ICD-10-CM | POA: Diagnosis not present

## 2024-04-22 DIAGNOSIS — Z1321 Encounter for screening for nutritional disorder: Secondary | ICD-10-CM | POA: Diagnosis not present

## 2024-04-22 DIAGNOSIS — I10 Essential (primary) hypertension: Secondary | ICD-10-CM | POA: Diagnosis not present

## 2024-04-22 DIAGNOSIS — R945 Abnormal results of liver function studies: Secondary | ICD-10-CM | POA: Diagnosis not present

## 2024-04-22 DIAGNOSIS — D519 Vitamin B12 deficiency anemia, unspecified: Secondary | ICD-10-CM | POA: Diagnosis not present

## 2024-05-02 ENCOUNTER — Other Ambulatory Visit: Payer: Self-pay | Admitting: Adult Health

## 2024-05-05 ENCOUNTER — Other Ambulatory Visit: Payer: Self-pay | Admitting: Nurse Practitioner

## 2024-05-08 ENCOUNTER — Other Ambulatory Visit: Payer: Self-pay

## 2024-05-08 MED ORDER — OMEPRAZOLE 20 MG PO CPDR: 20.0000 mg | DELAYED_RELEASE_CAPSULE | Freq: Every day | ORAL | 2 refills | Status: AC

## 2024-07-01 ENCOUNTER — Encounter: Payer: Self-pay | Admitting: Adult Health

## 2024-07-01 ENCOUNTER — Ambulatory Visit: Admitting: Adult Health

## 2024-07-01 ENCOUNTER — Other Ambulatory Visit (HOSPITAL_COMMUNITY)
Admission: RE | Admit: 2024-07-01 | Discharge: 2024-07-01 | Disposition: A | Discharge status: Home / Self Care | Source: Ambulatory Visit | Attending: Obstetrics & Gynecology | Admitting: Obstetrics & Gynecology

## 2024-07-01 VITALS — BP 154/84 | HR 73 | Ht 64.0 in | Wt 186.0 lb

## 2024-07-01 DIAGNOSIS — R87611 Atypical squamous cells cannot exclude high grade squamous intraepithelial lesion on cytologic smear of cervix (ASC-H): Secondary | ICD-10-CM | POA: Insufficient documentation

## 2024-07-01 DIAGNOSIS — I10 Essential (primary) hypertension: Secondary | ICD-10-CM

## 2024-07-01 DIAGNOSIS — Z1151 Encounter for screening for human papillomavirus (HPV): Secondary | ICD-10-CM

## 2024-07-01 DIAGNOSIS — Z124 Encounter for screening for malignant neoplasm of cervix: Secondary | ICD-10-CM | POA: Insufficient documentation

## 2024-07-01 DIAGNOSIS — Z01419 Encounter for gynecological examination (general) (routine) without abnormal findings: Secondary | ICD-10-CM | POA: Diagnosis present

## 2024-07-01 NOTE — Progress Notes (Addendum)
  Subjective:     Patient ID: Leslie Cobb, female   DOB: Dec 09, 1955, 68 y.o.   MRN: [id number]  HPI Leslie Cobb is a 68 year old white female, divorced, PM in for first pap after laser conization of the cervix for HSIL, 12/26/23.  PCP is Dr Lari  Review of Systems Denies any vaginal bleeding She is not sexually active Has pain right arm and shoulder has had injection and that has helped some Reviewed past medical,surgical, social and family history. Reviewed medications and allergies.     Objective:   Physical Exam BP (!) 154/84 (BP Location: Right Arm, Patient Position: Sitting, Cuff Size: Normal)   Pulse 73   Ht 5' 4 (1.626 m)   Wt 186 lb (84.4 kg)   BMI 31.93 kg/m     Skin warm and dry.Pelvic: external genitalia is normal in appearance no lesions, vagina: pale,urethra has no lesions or masses noted, cervix:smooth,pap with HR HPV genotyping performed, uterus: normal size, shape and contour, non tender, no masses felt, adnexa: no masses or tenderness noted. Bladder is non tender and no masses felt. She has mild BLE swelling.  Fall risk is moderate  Upstream - 07/01/24 1028       Pregnancy Intention Screening   Does the patient want to become pregnant in the next year? N/A    Does the patient's partner want to become pregnant in the next year? N/A    Would the patient like to discuss contraceptive options today? N/A      Contraception Wrap Up   Current Method Abstinence   PM   End Method Abstinence   PM   Contraception Counseling Provided No         Examination chaperoned by Clarita Salt LPN Assessment:     1. Atypical squamous cells cannot exclude high grade squamous intraepithelial lesion on cytologic smear of cervix (ASC-H) had laser cone 12/26/23 Pap sent, first pap after laser cone, Will repeat pap every 6 months for 2 years  - Cytology - PAP( )  2. Pap smear for cervical cancer screening  First pap after Laser conization for ASCH  3. Chronic  hypertension Take BP meds and follow up with PCP     Plan:    Return in 6 months for pap and physical

## 2024-07-03 LAB — CYTOLOGY - PAP
Comment: NEGATIVE
Diagnosis: NEGATIVE
High risk HPV: NEGATIVE

## 2024-07-04 ENCOUNTER — Ambulatory Visit: Payer: Self-pay | Admitting: Adult Health

## 2024-08-29 ENCOUNTER — Other Ambulatory Visit: Payer: Self-pay | Admitting: Cardiology

## 2024-09-07 ENCOUNTER — Other Ambulatory Visit: Payer: Self-pay | Admitting: Adult Health

## 2024-10-06 ENCOUNTER — Other Ambulatory Visit: Payer: Self-pay | Admitting: Adult Health

## 2024-10-12 ENCOUNTER — Other Ambulatory Visit: Payer: Self-pay | Admitting: Cardiology

## 2024-10-15 ENCOUNTER — Encounter: Payer: Self-pay | Admitting: Cardiology

## 2024-10-15 ENCOUNTER — Ambulatory Visit: Admitting: Cardiology

## 2024-10-15 ENCOUNTER — Encounter: Payer: Self-pay | Admitting: Family Medicine

## 2024-10-15 VITALS — BP 130/70 | HR 68 | Ht 64.0 in | Wt 182.8 lb

## 2024-10-15 DIAGNOSIS — I25119 Atherosclerotic heart disease of native coronary artery with unspecified angina pectoris: Secondary | ICD-10-CM

## 2024-10-15 DIAGNOSIS — I10 Essential (primary) hypertension: Secondary | ICD-10-CM | POA: Diagnosis not present

## 2024-10-15 DIAGNOSIS — E782 Mixed hyperlipidemia: Secondary | ICD-10-CM | POA: Diagnosis not present

## 2024-10-15 DIAGNOSIS — I2510 Atherosclerotic heart disease of native coronary artery without angina pectoris: Secondary | ICD-10-CM

## 2024-10-15 DIAGNOSIS — R011 Cardiac murmur, unspecified: Secondary | ICD-10-CM

## 2024-10-15 NOTE — Progress Notes (Signed)
 "    Cardiology Office Note  Date: 10/15/2024   ID: Leslie Cobb, DOB 07/06/1956, MRN [id number]  History of Present Illness: Leslie Cobb is a 69 y.o. female who last seen by Ms. Franchester RIGGERS in May 2025, I reviewed the note.  She is here for a routine visit.  Reports no interval angina or change in stamina, NYHA class II dyspnea, no palpitations or syncope.  She continues to follow at [facility] for primary care, requesting interval lab work for review.  I went over her medications, she reports compliance with current regimen, no intolerances.  I rechecked her blood pressure today at 130/70.  She did have an interval echocardiogram in June 2025 as noted below.  I reviewed her ECG today which shows an ectopic atrial rhythm with low voltage, decreased R wave progression.  Physical Exam: VS:  BP 130/70 (BP Location: Right Arm)   Pulse 68   Ht 5' 4 (1.626 m)   Wt 182 lb 12.8 oz (82.9 kg)   SpO2 96%   BMI 31.38 kg/m , BMI Body mass index is 31.38 kg/m.  Wt Readings from Last 3 Encounters:  10/15/24 182 lb 12.8 oz (82.9 kg)  07/01/24 186 lb (84.4 kg)  01/21/24 188 lb 3.2 oz (85.4 kg)    General: Patient appears comfortable at rest. HEENT: Conjunctiva and lids normal. Neck: Supple, no elevated JVP or carotid bruits. Lungs: Clear to auscultation, nonlabored breathing at rest. Cardiac: Regular rate and rhythm, no S3, 1/6 systolic murmur, no pericardial rub. Extremities: No pitting edema.  ECG:  An ECG dated 06/20/2023 was personally reviewed today and demonstrated:  Sinus rhythm with low voltage, decreased R wave progression, nonspecific T wave changes.  Labwork: 12/24/2023: ALT 26; AST 27; BUN 33; Creatinine, Ser 1.02; Hemoglobin 11.7; Platelets 227; Potassium 3.9; Sodium 137   Other Studies Reviewed Today:  Echocardiogram 02/21/2024:  1. Left ventricular ejection fraction, by estimation, is 60 to 65%. The  left ventricle has normal function. The left ventricle has  no regional  wall motion abnormalities. There is mild left ventricular hypertrophy.  Left ventricular diastolic parameters  are consistent with Grade I diastolic dysfunction (impaired relaxation).   2. Right ventricular systolic function is normal. The right ventricular  size is normal. There is normal pulmonary artery systolic pressure.   3. The mitral valve is normal in structure. No evidence of mitral valve  regurgitation. No evidence of mitral stenosis.   4. The aortic valve is tricuspid. Aortic valve regurgitation is not  visualized. No aortic stenosis is present.   5. The inferior vena cava is normal in size with greater than 50%  respiratory variability, suggesting right atrial pressure of 3 mmHg.   Assessment and Plan:  1.  Multivessel CAD status post CABG in 2019 including LIMA to LAD, SVG to OM1 and OM 2, and SVG to PLB.  Follow-up cardiac catheterization in November 2023 revealed patent bypass grafts.  LVEF 60 to 65% with grade 1 diastolic dysfunction by echocardiogram in June 2025.  She denies interval angina or nitroglycerin  use, ECG reviewed.  Plan to continue aspirin  81 mg daily, Imdur  30 mg daily, and statin.   2.  Primary hypertension.  No change in current regimen which includes Norvasc 10 mg daily, HCTZ 25 mg daily, Cozaar  100 mg daily, and Lopressor  25 mg twice daily.   3.  Mixed hyperlipidemia.  Requesting interval follow-up from PCP.  She continues on Lipitor  80 mg daily.  Disposition:  Follow up  1 year.  Signed, Jayson JUDITHANN Sierras, M.D., F.A.C.C. [facility] "

## 2024-10-15 NOTE — Patient Instructions (Signed)
Medication Instructions:  Your physician recommends that you continue on your current medications as directed. Please refer to the Current Medication list given to you today.   Labwork: None today  Testing/Procedures: None today  Follow-Up: 1 year Dr.McDowell  Any Other Special Instructions Will Be Listed Below (If Applicable).  If you need a refill on your cardiac medications before your next appointment, please call your pharmacy.
